# Patient Record
Sex: Male | Born: 1955
Health system: Southern US, Community
[De-identification: ages and names within clinical notes are randomized; demographics above are authoritative.]

## PROBLEM LIST (undated history)

## (undated) DIAGNOSIS — G4733 Obstructive sleep apnea (adult) (pediatric): Secondary | ICD-10-CM

## (undated) DIAGNOSIS — I10 Essential (primary) hypertension: Secondary | ICD-10-CM

## (undated) DIAGNOSIS — M1990 Unspecified osteoarthritis, unspecified site: Secondary | ICD-10-CM

## (undated) DIAGNOSIS — N433 Hydrocele, unspecified: Secondary | ICD-10-CM

## (undated) DIAGNOSIS — M545 Low back pain, unspecified: Secondary | ICD-10-CM

## (undated) DIAGNOSIS — E785 Hyperlipidemia, unspecified: Secondary | ICD-10-CM

## (undated) DIAGNOSIS — Z973 Presence of spectacles and contact lenses: Secondary | ICD-10-CM

## (undated) DIAGNOSIS — G8929 Other chronic pain: Secondary | ICD-10-CM

## (undated) DIAGNOSIS — Z9989 Dependence on other enabling machines and devices: Secondary | ICD-10-CM

## (undated) HISTORY — PX: COLONOSCOPY: SHX174

## (undated) HISTORY — PX: KNEE ARTHROSCOPY W/ SYNOVECTOMY: SHX1887

## (undated) HISTORY — PX: TONSILLECTOMY: SUR1361

## (undated) HISTORY — PX: TOTAL KNEE ARTHROPLASTY: SHX125

## (undated) HISTORY — PX: KNEE ARTHROSCOPY: SHX127

---

## 1993-04-17 HISTORY — PX: TOTAL HIP ARTHROPLASTY: SHX124

## 1997-04-17 HISTORY — PX: ANTERIOR CERVICAL DECOMP/DISCECTOMY FUSION: SHX1161

## 2001-01-23 ENCOUNTER — Encounter: Admission: RE | Admit: 2001-01-23 | Discharge: 2001-04-23 | Payer: Self-pay | Admitting: Family Medicine

## 2001-08-21 ENCOUNTER — Encounter: Admission: RE | Admit: 2001-08-21 | Discharge: 2001-11-19 | Payer: Self-pay | Admitting: Family Medicine

## 2003-06-04 ENCOUNTER — Encounter: Admission: RE | Admit: 2003-06-04 | Discharge: 2003-06-04 | Payer: Self-pay | Admitting: Family Medicine

## 2006-11-20 ENCOUNTER — Encounter: Payer: Self-pay | Admitting: Cardiology

## 2006-11-20 HISTORY — PX: TRANSTHORACIC ECHOCARDIOGRAM: SHX275

## 2007-01-29 ENCOUNTER — Inpatient Hospital Stay (HOSPITAL_COMMUNITY): Admission: RE | Admit: 2007-01-29 | Discharge: 2007-02-01 | Payer: Self-pay | Admitting: Orthopedic Surgery

## 2007-01-29 ENCOUNTER — Encounter: Payer: Self-pay | Admitting: Orthopedic Surgery

## 2007-01-30 ENCOUNTER — Ambulatory Visit: Payer: Self-pay | Admitting: Internal Medicine

## 2007-02-07 ENCOUNTER — Inpatient Hospital Stay (HOSPITAL_COMMUNITY): Admission: AD | Admit: 2007-02-07 | Discharge: 2007-02-09 | Payer: Self-pay | Admitting: Orthopedic Surgery

## 2007-02-16 ENCOUNTER — Encounter: Admission: RE | Admit: 2007-02-16 | Discharge: 2007-02-16 | Payer: Self-pay | Admitting: Orthopedic Surgery

## 2007-03-18 ENCOUNTER — Inpatient Hospital Stay (HOSPITAL_COMMUNITY): Admission: RE | Admit: 2007-03-18 | Discharge: 2007-03-21 | Payer: Self-pay | Admitting: Orthopedic Surgery

## 2007-05-04 ENCOUNTER — Encounter: Admission: RE | Admit: 2007-05-04 | Discharge: 2007-05-04 | Payer: Self-pay | Admitting: Orthopedic Surgery

## 2008-04-13 ENCOUNTER — Inpatient Hospital Stay (HOSPITAL_COMMUNITY): Admission: RE | Admit: 2008-04-13 | Discharge: 2008-04-16 | Payer: Self-pay | Admitting: Orthopedic Surgery

## 2010-06-08 ENCOUNTER — Ambulatory Visit (INDEPENDENT_AMBULATORY_CARE_PROVIDER_SITE_OTHER): Payer: BC Managed Care – PPO | Admitting: Cardiology

## 2010-06-08 DIAGNOSIS — R079 Chest pain, unspecified: Secondary | ICD-10-CM

## 2010-06-22 ENCOUNTER — Telehealth (INDEPENDENT_AMBULATORY_CARE_PROVIDER_SITE_OTHER): Payer: Self-pay | Admitting: Radiology

## 2010-06-23 ENCOUNTER — Ambulatory Visit (HOSPITAL_COMMUNITY): Payer: BC Managed Care – PPO | Attending: Cardiology

## 2010-06-23 ENCOUNTER — Encounter: Payer: Self-pay | Admitting: Internal Medicine

## 2010-06-23 DIAGNOSIS — R079 Chest pain, unspecified: Secondary | ICD-10-CM

## 2010-06-23 DIAGNOSIS — I4949 Other premature depolarization: Secondary | ICD-10-CM

## 2010-06-23 HISTORY — PX: CARDIOVASCULAR STRESS TEST: SHX262

## 2010-06-28 NOTE — Progress Notes (Signed)
Summary: Nuclear Pre-Procedure  Phone Note Outgoing Call Call back at [facility] Phone [phone]   Call placed by: Stanton Kidney, EMT-P,  June 22, 2010 11:05 AM Call placed to: Patient Action Taken: Phone Call Completed Reason for Call: Confirm/change Appt Summary of Call: Left message with information on [employer] Information Sheet (see scanned document for details). Stanton Kidney, EMT-P  June 22, 2010 11:05 AM      Nuclear Med Background Indications for Stress Test: Evaluation for Ischemia     Symptoms: Chest Pain  Symptoms Comments: Atypical-CP   Nuclear Pre-Procedure Cardiac Risk Factors: Family History - CAD, Hypertension, Lipids, Obesity Tech Comments: morbid obesity; premature fam. hx

## 2010-06-30 ENCOUNTER — Ambulatory Visit (HOSPITAL_COMMUNITY): Payer: BC Managed Care – PPO | Attending: Cardiology

## 2010-06-30 ENCOUNTER — Encounter: Payer: Self-pay | Admitting: Cardiology

## 2010-06-30 DIAGNOSIS — R079 Chest pain, unspecified: Secondary | ICD-10-CM

## 2010-07-05 NOTE — Assessment & Plan Note (Signed)
Summary: Cardiology Nuclear Testing  Nuclear Med Background Indications for Stress Test: Evaluation for Ischemia   History: Myocardial Perfusion Study  History Comments: '08 [facility] NL  Symptoms: Chest Pain, Palpitations  Symptoms Comments: Atypical-CP   Nuclear Pre-Procedure Cardiac Risk Factors: Family History - CAD, Hypertension, Lipids, Obesity Caffeine/Decaff Intake: none NPO After: 9:00 PM Lungs: clear IV 0.9% NS with Angio Cath: 20g     IV Site: R Hand IV Started by: Milana Na, EMT-P Chest Size (in) 58     Height (in): 72" Weight (lb): 350  Nuclear Med Study 1 or 2 day study:  2 day     Stress Test Type:  Eugenie Birks Reading MD:  Dietrich Pates, MD     Referring MD:  S.Tennant Resting Radionuclide:  Technetium 61m Tetrofosmin     Resting Radionuclide Dose:  33 mCi  Stress Radionuclide:  Technetium 29m Tetrofosmin     Stress Radionuclide Dose:  33 mCi   Stress Protocol  Max Systolic BP: 108 mm Hg Lexiscan: 0.4 mg   Stress Test Technologist:  Milana Na, EMT-P     Nuclear Technologist:  Doyne Keel, CNMT  Rest Procedure  Myocardial perfusion imaging was performed at rest 45 minutes following the intravenous administration of Technetium 65m Tetrofosmin.  Stress Procedure  The patient received IV Lexiscan 0.4 mg over 15-seconds.  Technetium 12m Tetrofosmin injected at 30-seconds.  There were no significant changes and occ pvcs with infusion.  Quantitative spect images were obtained after a 45 minute delay.  QPS Raw Data Images:  Images were motion corrected.  Extensive soft tissue (diaphragm, subcutaneous fat) surrounds heart. Stress Images:  Normal homogeneous uptake in all areas of the myocardium. Rest Images:  Normal homogeneous uptake in all areas of the myocardium. Subtraction (SDS):  No evidence of ischemia. Transient Ischemic Dilatation:  .89  (Normal <1.22)  Lung/Heart Ratio:  .33  (Normal <0.45)  Quantitative Gated Spect Images QGS EDV:  140 ml QGS  ESV:  55 ml QGS EF:  60 %   Overall Impression  Exercise Capacity: Lexiscan with no exercise. BP Response: Normal blood pressure response. Clinical Symptoms: No chest pain ECG Impression: No significant ST segment change suggestive of ischemia. Overall Impression: Normal stress nuclear study.

## 2010-08-30 NOTE — Op Note (Signed)
NAMEGEFFREY, MICHAELSEN                ACCOUNT NO.:  [id number]   MEDICAL RECORD NO.:  [id number]          PATIENT TYPE:  INP   LOCATION:  5729                         FACILITY:  [facility]   PHYSICIAN:  Feliberto Gottron. Turner Daniels, M.D.   DATE OF BIRTH:  [birth year]   DATE OF PROCEDURE:  01/29/2007  DATE OF DISCHARGE:                               OPERATIVE REPORT   PREOPERATIVE DIAGNOSIS:  Septic left knee.   POSTOPERATIVE DIAGNOSIS:  Septic left knee.   PROCEDURE:  1. Left knee arthroscopic irrigation and debridement.  Preoperative      cultures of joint fluid were sent.  2. Complete synovectomy   SURGEON:  Feliberto Gottron.  Turner Daniels, MD.   FIRST ASSISTANT:  None.   ANESTHETIC:  General endotracheal.   ESTIMATED BLOOD LOSS:  Minimal.   FLUID REPLACEMENT:  100 mL of crystalloid.   DRAINS PLACED:  Two large bore Hemovacs.   INDICATIONS FOR PROCEDURE:  A 55 year old man who had a right total hip  by me about 12 years ago, and I have known him for the last 12 years,  and he has done well.  He has a significant arthritis of both knees,  bone-on-bone arthritic changes.  He was coming up on a left total knee,  scheduled for next week, had had a cortisone injection about 3 or 4  weeks ago.  He did well until about 3 or 4 days ago when he started  having increased pain in his left knee.  The night before last and last  night he had some chills.  He came into the office, today, with  increased swelling and pain in his left knee.   This was preoperative H&P his temperature was 101.8.  We aspirated his  knee and got out about 30 mL of what to me looked like pus that was  yellow.  This was sent for a cell count, Gram stain, and culture.  The  cell count was 35,000 the, Gram stain was 98% polys too numerous to  count, they saw calcium pyrophosphate crystals.  They did not see any  organisms.  They did not see any calcium urate crystals.  He was taken  for urgent irrigation and debridement and thorough washout of  his left  knee.  The presumed diagnosis is going to be probably Staphylococcus  aureus;  and, in addition, we are concerned about MRSA although he has  never had that before, and his total hip was done well for 12 years.  The risks and benefits of surgery were discussed, questions answered.   DESCRIPTION OF PROCEDURE:  The patient identified by armband and  preoperatively received a gram of Ancef.  He was then taken to the  operating room at [facility].  Appropriate site monitors were  attached, and general endotracheal anesthesia was induced.  Tourniquet  applied high to the left thigh but never used.  Lateral post applied to  the table and left lower extremity prepped and draped in a sterile  fashion from the ankle to the mid thigh.  Using a #11 blade standard  inferomedial  and then lateral parapatellar portals were then made  allowing introduction of the arthroscope through the inferolateral  portal, and the outflow through the inferomedial portal.   We immediately encountered purulent material, and then thoroughly  irrigated out the knee with about 6 liters of normal saline with  epinephrine solution.  A complete synovectomy was performed.  We also  removed some denuded cartilage.  His medial compartment was down to bare  bone on the femoral and tibial side; and there was some inflamed  synovium, although not as much as we normally would expect with  pyarthrosis, especially one that may have smoldered for while, although  he was only symptomatic for about 3 days.   In any event, after thorough irrigation and debridement and synovectomy,  the arthroscopic instruments were then used to place two large bore  Hemovac drain using superomedial and superolateral portals.  These were  placed to suction and sewn in.  Arthroscopic instruments removed,  dressing of Xeroform, 4x4 dressing, sponges, ABD, Webril, and Ace wrap  was then applied.  The patient was then awakened and taken to  the  recovery room without difficulty.      Feliberto Gottron. Turner Daniels, M.D.  Electronically Signed     FJR/MEDQ  D:  01/29/2007  T:  01/30/2007  Job:  [id number]

## 2010-08-30 NOTE — Op Note (Signed)
[name], [name] NO.:  [id number]   MEDICAL RECORD NO.:  [id number]          PATIENT TYPE:  INP   LOCATION:  5033                         FACILITY:  [facility]   PHYSICIAN:  Feliberto Gottron. Turner Daniels, M.D.   DATE OF BIRTH:  06/03/1955   DATE OF PROCEDURE:  04/13/2008  DATE OF DISCHARGE:                               OPERATIVE REPORT   PREOPERATIVE DIAGNOSIS:  End-stage arthritis of the right knee with far  valgus deformity of about 10-15 degrees, 10-degree flexion contracture  and bone-on-bone arthritic changes.   POSTOPERATIVE DIAGNOSIS:  End-stage arthritis of the right knee with far  valgus deformity of about 10-15 degrees, 10-degree flexion contracture  and bone-on-bone arthritic changes.   PROCEDURE:  Right total knee arthroplasty using DePuy Sigma RP  components, a 5 tibial baseplate for femoral component, right 41-mm  patellar button and 10-mm Sigma RP bearing.   SURGEON:  Feliberto Gottron. Turner Daniels, MD   FIRST ASSISTANT:  Shirl Harris, PA-C   ANESTHETIC:  General endotracheal.   ESTIMATED BLOOD LOSS:  Minimal.   FLUID REPLACEMENT:  1800 mL of crystalloid.   DRAINS PLACED:  Foley catheter.   URINE OUTPUT:  300 mL, two medium Hemovacs.   TOURNIQUET TIME:  1 hour and 55 minutes.   INDICATIONS FOR PROCEDURE:  A 55 year old gentleman who has had previous  hip replacement surgery and left knee replacement surgery by me most  recently a couple of years ago using DePuy Sigma RP components.  He had  a varus knee on the left.  He has a valgus knee on the right bone-on-  bone with a 15-degree valgus deformity, 10-degree flexion contracture  and basically this is a far valgus arthritic knee.  He has severe  unremitting pain, has failed conservative treatment and desires elective  total knee arthroplasty and risks and benefits of surgery are well known  to the patient.   DESCRIPTION OF PROCEDURE:  The patient was identified by armband and  underwent right femoral nerve  block in the block area at [facility], received 2 g of Ancef IV preoperatively and was taken to  operating room 10 where the appropriate anesthetic monitors were  attached and general endotracheal anesthesia was induced with the  patient in supine position.  A Foley catheter was inserted.  Tourniquet  was applied high to the right thigh and the right lower extremities were  then prepped and draped in the usual sterile fashion from the ankle to  the tourniquet.  Lime wrapped with an Esmarch bandage, tourniquet was  inflated to 350 mmHg.  Standard time-out procedure was performed.  Anterior midline incision, about 22 cm in length was made from a  handbreadth above the patella over the patella 1 cm medial to and 4 cm  distal to the tibial tubercle.  Small bleeders in the skin and  subcutaneous were identified and cauterized.  Transverse retinaculum was  incised and reflected medially allowing medial parapatellar arthrotomy.  The patella was everted.  Prepatellar fat pad was then resected as where  the anterior one half of the menisci with  the knee flexed.  We then used  a 1/2-inch osteotome where to remove peripheral osteophytes from the  femur as well as notch osteophytes and also excised the ACL and PCL  ligaments.  The superficial medial collateral ligament was elevated from  the anterior to posterior off the proximal tibia leaving intact distally  allowing placement a posteromedial Z retractor and Michaela retractor  through the notch and lateral Homan retractor with the knee flexed about  150 degrees.  It was translated to the proximal tibia anteriorly, then  entered with the DePuy step drill followed by an IM rod and a 2-degree  posterior-slope cutting guide.  This was pinned into place allowing  removal about 6-7 mm of bone laterally, 8-9 mm of bone medially from the  proximal tibia and with a guide pinned in place proximal tibial cut  accomplished.  We then entered the distal  femur 2 mm of the anterior of  the PCL origin with a step drill followed by an IM rod and 7-degree  right distal femoral cutting guide set at 12 mm, pinned along the  epicondylar axis and the distal femoral cut accomplished removing a  couple of millimeters of bone from the lateral femoral condyle on the  full 12 mm on the medial femoral condyle.  We then sized for #4 femoral  component use of posterior referencing sizing guide and pinned in place  an 0 degrees of external rotation and then placed the chamfer cutting  guide over the pins and screw the guide into place.  The anterior-  posterior and chamfer cuts were then accomplished without difficulty  followed by the Sigma RP box cut.  The patella was then measured at 27  mm.  He used 41-button on the other side.  We set the cutting guide at  16 and removed the posterior 11 mm of the patella sized for 41 button  and drilled.  The knee was then hyperflexed exposing the proximal tibia  which did in fact sized to a #5 proximal tibial baseplate.  This was  pinned into place followed by the reamer guide followed by conical  reamer and then the Delta fin keel punch.  A #4 right femoral trial was  then hammered into place and drilled with the lug drill.  A 10-mm Sigma  RP trial bearing was placed on the tibia.  The knee was then reduced  brought up to full extension and came to full extension with a 7-degree  alignment, which was felt to be excellent and the ligaments were stable.  Interestingly because of fairly generous patellar resection, the patella  tracked without any lateral release and no thumb pressure.  At this  point, the trial components were removed.  We removed the posterior  horns of the medial and lateral menisci at this point with the knee in  extension.  The knee was then flexed and all bony surfaces were water-  picked, clean, and dry with suction and sponges.  We also checked for  any posterior osteophytes superior to the  femur.  At the back table, a  double batch of DePuy HV cement with 1500 mg of Zinacef was mixed and  applied all bony metallic mating surfaces except for the posterior  condyles of the femur itself.  In order, we hammered into place, a #5  tibial baseplate, and removed the excess cement.  A 4 right femoral  component removed the excess cement and then clamped the 41-mm patellar  button  in place and removed the excess cement.  The 10-mm Sigma RP  bearing was then snapped into place and the knee was reduced and held in  extension with compression as the cement hardened.  Drains were placed  from an anterolateral approach and the wound was pulse lavaged at one  more time with normal saline solution.  At this point, we checked our  patellar tracking one more time and the ligaments one more time.  Stability was excellent.  The parapatellar arthrotomy was then closed  with running #1 Vicryl suture.  The subcutaneous tissue with 0 and 2-0  undyed Vicryl suture and the skin with skin staples.  A dressing of  Xeroform, 4 x 4 dressing, sponges, Webril, and an Ace wrap applied.  The  tourniquet was let down.  The patient awakened and taken to the recovery  room without difficulty.      Feliberto Gottron. Turner Daniels, M.D.  Electronically Signed     FJR/MEDQ  D:  04/13/2008  T:  04/13/2008  Job:  [id number]

## 2010-08-30 NOTE — Op Note (Signed)
[name], [name]                ACCOUNT NO.:  [id number]   MEDICAL RECORD NO.:  [id number]          PATIENT TYPE:  OIB   LOCATION:  3019                         FACILITY:  [facility]   PHYSICIAN:  Feliberto Gottron. Turner Daniels, M.D.   DATE OF BIRTH:  Jan 22, 1956   DATE OF PROCEDURE:  02/05/2007  DATE OF DISCHARGE:                               OPERATIVE REPORT   PREOPERATIVE DIAGNOSIS:  Presumed septic arthritis left knee.   POSTOPERATIVE DIAGNOSIS:  Presumed septic arthritis left knee.   PROCEDURE:  Redo arthroscopic washout and synovectomy of the left knee.   SURGEON:  Feliberto Gottron.  Turner Daniels, MD.   FIRST ASSISTANT:  None.   ANESTHETIC:  General endotracheal.   ESTIMATED BLOOD LOSS:  Minimal.   FLUID REPLACEMENT:  800 mL of crystalloid.   SPECIMENS:  To lab, second set of fluid gram-stain cultures; including  AFB and fungi.   INDICATIONS FOR PROCEDURE:  A 55 year old man who was getting set up for  a total knee arthroplasty and came in for his preoperative history and  physical a week ago.  He was found to have a red-hot swollen knee and  felt systemically ill.  We went ahead and tapped his knee; and got out  30 mL of fluid with a cell count of 35,000, 98% polys, no organisms, and  intracellular calcium pyrophosphate crystals.  Because of his overall  condition and the fact he had a fever of 102, we went ahead and admitted  him.  We arthroscopically washed out his knee.  The cultures came back  negative at 3 and 5 days.  He was kept on IV vancomycin until Thursday  evening.  When those cultures came back negative and in consultation  with infectious diseases, antibiotics were stopped and drains removed  Friday morning.  ID felt that he did not  actually have an infection and  felt his condition was secondary to CPPD, despite being on anti-  inflammatory medicines; and he was sent home.   He called back last evening saying he was feeling sick again with some  swelling in his knee.  We had him  come in this morning at 8:30.  Once  again, 3+ effusion; this time the fluid was quite turbid.  The cell  count had gone up to 69,000, 98%  polys; and again, intracellular CPPD  crystals and no organisms.  Because he felt ill and again had a fever up  to 101.7, we went ahead and brought him into the hospital and gave him a  dose of IV vancomycin and prepared him for arthroscopic washout of his  left knee again.  The risks and benefits of surgery were well-known to  the patient.  We will get him set up for his surgical intervention.   DESCRIPTION OF PROCEDURE:  The patient was identified by armband and  taken to the operating room at [facility].  There the  appropriate anesthetic monitors were attached and general endotracheal  anesthesia induced with the patient in supine position.  Lateral posts  were applied to the table.  The left  lower extremity prepped and draped  in the usual sterile fashion from the ankle to the mid thigh.  Using a  #11 blade, standard inferomedial and inferolateral peripatellar portals  were then made; after introduction of the arthroscope through the  inferolateral portal and the outflow through the inferomedial portal.  Purulent material was obtained and sent off for Gram-stain culture cell  count -- including aerobic, anaerobic, AFB and fungi.  At this point, we  set about performing yet another synovectomy. Once again, the synovium  was moderately inflamed but not severely inflamed, and it was easy to  scope the knee.  We scoped out the medial and lateral compartments;  found again some shredding of the cartilage along the meniscus.  But,  again, he is down to bare bone medially anyway.  Therefore, there was no  articular cartilage to debride on the medial side.  The lateral  articular cartilage appeared to be unchanged from a week ago.  The  gutters were cleared medially and laterally.  Some inflammatory material  was removed from the notch with  a 4.2 [employer] shaver, and  inflamed synovium from the suprapatellar pouch.  Overall we irrigated  out with almost 6 liters of normal saline solution, and placed in large-  bore Hemovac drains from a superomedial and superolateral parapatellar  portals.  These drains were sewn in.  A dressing of Xerofoam 4x4  dressing sponges, Webril and an Ace wrap was then applied.  The drains  were placed to suction.  The patient was then awakened and taken to the  recovery room without difficulty.      Feliberto Gottron. Turner Daniels, M.D.  Electronically Signed     FJR/MEDQ  D:  02/05/2007  T:  02/06/2007  Job:  [id number]

## 2010-08-30 NOTE — Op Note (Signed)
[name], STENE NO.:  [id number]   MEDICAL RECORD NO.:  [id number]          PATIENT TYPE:  INP   LOCATION:  2899                         FACILITY:  [facility]   PHYSICIAN:  Feliberto Gottron. Turner Daniels, M.D.   DATE OF BIRTH:  1955-10-07   DATE OF PROCEDURE:  03/18/2007  DATE OF DISCHARGE:                               OPERATIVE REPORT   PREOPERATIVE DIAGNOSIS:  End-stage arthritis of left knee.   POSTOPERATIVE DIAGNOSIS:  End-stage arthritis of left knee.   PROCEDURE:  Left total knee arthroplasty using DePuy Sigma RP  components, 4 femur left, 5 tibia, 10-mm Sigma RP spacer, 41-mm patellar  bearing, double batch of DePuy I cement with the 1200 mg of tobramycin.   FIRST ASSISTANT:  Skip Mayer PA-C.   ANESTHETIC:  General endotracheal.   ESTIMATED BLOOD LOSS:  Minimal.   FLUID REPLACEMENT:  Approximately 1500 mL crystalloid.   DRAINS PLACED:  Foley catheter and two medium hemostats.   URINE OUTPUT:  Approximately 300 mL.   TOURNIQUET TIME:  Approximately 1 hour 25 minutes.   INDICATIONS FOR PROCEDURE:  A 55 year old male who has already had a  total hip arthroplasty and has end-stage arthritis of left and right  knee.  The left knee is in varus, the right knee is in valgus.  He has  wind-swept deformity.  He was supposed to have this knee replacement  done about four months ago but preoperatively had swelling and erythema  around the knee.  When we tapped it, it looked like pus and came back  with about 15,000 to 20,000 white cells but grew out nothing.  He  underwent an arthroscopic debridement and grew out nothing at those  cultures as well.  We he came back a week later, he had reaccumulated.  We tapped it again and came back with 40,000 cells, and again those  cultures were negative as were a second set of cultures from yet another  arthroscopic debridement.  He did have pseudogout crystals on all four  specimens.  He was seen by rheumatology and it was felt  this was a  pseudogout attack which would be very unusual, but that is what it was.  He was treated with colchicine but also had vancomycin when he was in  the hospital, and because of concern about MRSA, although again nothing  ever grew out and I never saw any organisms on any of the four Gram  stains, he also received a four-week course of Avelox.  He has been off  of those medications now for over two months.  He has not reaccumulated.  A preoperative specimen of fluid looked like normal joint fluid last  week and came back essentially with no growth, no organisms and only a  few white cells.  It was felt that his difficulties in his left knee  that were recently exacerbated were from pseudogout which again would be  very unusual.   In any event, he is ready now for total knee arthroplasty.  Risks and  benefits of surgery were discussed.  He is going to receive  some  tobramycin mixed into the cement which would be of course a good drug  for MRSA if he, in fact, has MRSA.  Risks and benefits of surgery were  discussed and questions answered.   DESCRIPTION OF PROCEDURE:  The patient was identified by armband and  underwent left femoral nerve block in the block area.  He was then taken  to OR 1 at [facility].  Appropriate anesthetic monitors were  attached.  General endotracheal anesthesia was induced.  He received  preoperative antibiotics IV.  A Foley catheter was inserted.  A  tourniquet was applied high to the left thigh and the left lower  extremity prepped and draped in the usual sterile fashion from the ankle  to the tourniquet.  We also placed a foot positioner on the lateral post  on the table.   After the prep and drape, the limb was wrapped with an Esmarch bandage  and the tourniquet inflated to 350 mmHg.  We began the procedure by  making an anterior midline incision starting a handbreadth above the  patella, going over the patella 1 cm medial to and 3 cm distal  to the  tibial tubercle.  Small bleeders in the skin and subcutaneous tissue  were identified and cauterized.  Transverse retinaculum was incised in  line with the skin incision, reflected medially allowing a medial  parapatellar arthrotomy.  We resected prepatellar fat pad and elevated  the superficial medial collateral ligament from anterior to posterior  off the proximal tibia, leaving it intact distally.  A specimen of  normal-appearing joint fluid was sent to the lab and came back later  with no organisms and no white cells.  After elevating the superficial  medial collateral ligament all the way around to the posteromedial  corner, the knee was then hyperflexed with the patella everted.  Osteotomes were used to removed peripheral osteophytes which were quite  exuberant from the medial femur, tibia and around the patella as well as  notch osteophytes.  The ACL and the PCL were resected at this point with  electrocautery.  The knee was externally rotated and the tibia brought  forward using a Michaela retractor through the notch and a lateral Homan  retractor.  We then entered the proximal tibia with the DePuy step drill  followed by the IM rod and a 0-degree posterior slope cutting guide.   Using the cutting guide, we removed only a couple millimeters of bone  medially and 8 to 9 mm of bone laterally.  Satisfied with a proximal  tibia resection, we then entered the femur 2 mm anterior the PCL origin  followed by the IM rod set for a 5-degree left distal femoral cut.  The  cutting guide was set at 12 mm because the patient's rather large body  habitus and the distal left femoral cut accomplished.  We then sized for  a 4 femoral component, pinned the cutting guide in 3 degrees of external  rotation and performed our anterior-posterior chamfer cuts followed by  the box cut with the box cutting guide.  The patella itself was measured  at 24 mm.  We set the cutting guide at 14 and removed  the posterior 10  mm of the patella and sized for 41 button and drilled the patella.  The  patella also had some very exuberant osteophytes that required removal  as well.  We then hyperflexed the knee one more time and sized for a 5  tibial  baseplate which was pinned into place followed by the smokestack  and the conical reamer followed by the bullet and the Delta 5th keel  punch.  We then hammered into place a 5 left trial femoral component,  drilled the lugs and a 10-mm bearing was placed on top of the tibial  trial and the knee reduced.  The patient came to full extension, flexed  to 130 degrees.  There was no restriction of motion and the 41-mm  patellar trial tracked normally.  At this point, all trial components  were removed.  Bony surfaces were Water-Pik'd clean, dried with suction  and sponges and a double batch of DePuy 1 cement was then mixed and  applied to all bony and metallic mating surfaces except for the  posterior condyles of the femur itself.   In order, we hammered into place a #5 tibial baseplate and removed  excess cement, a 4 left femoral component and removed excess cement, a  41-mm patellar button was then squeezed into place and excess cement  removed.  As the cement hardened, we placed a lateral medium Hemovac  drain, Water-Pik'd the wound clean one more time, removed the clamp on  the patella, checked our tracking one more time, made sure there were no  bits of cement in the wound and then closed the parapatellar arthrotomy  with running #1 Vicryl suture, the subcutaneous tissue with 0 and 2-0  undyed Vicryl suture and the skin with skin staples.  A dressing of  Xeroform, 4 x 4 dressing sponges, Webril and an Ace wrap were applied.  The tourniquet was let down.  The patient was awakened and taken to the  recovery room without difficulty.      Feliberto Gottron. Turner Daniels, M.D.  Electronically Signed     FJR/MEDQ  D:  03/18/2007  T:  03/18/2007  Job:  [id number]

## 2010-09-02 NOTE — Discharge Summary (Signed)
NAMEARVEL, OQUINN                ACCOUNT NO.:  [id number]   MEDICAL RECORD NO.:  [id number]          PATIENT TYPE:  INP   LOCATION:  5020                         FACILITY:  [facility]   PHYSICIAN:  Feliberto Gottron. Turner Daniels, M.D.   DATE OF BIRTH:  [birth year]   DATE OF ADMISSION:  03/18/2007  DATE OF DISCHARGE:  03/21/2007                               DISCHARGE SUMMARY   ADDENDUM:  This is a continuation of dictation number [id number].   DIAGNOSES:  Infection of left knee with underlying degenerative joint  disease.   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      Laural Benes. Jannet Mantis.      Feliberto Gottron. Turner Daniels, M.D.  Electronically Signed    JBR/MEDQ  D:  05/10/2007  T:  05/10/2007  Job:  [id number]

## 2010-09-02 NOTE — Discharge Summary (Signed)
[name], [name] NO.:  [id number]   MEDICAL RECORD NO.:  [id number]          PATIENT TYPE:  INP   LOCATION:  5033                         FACILITY:  [facility]   PHYSICIAN:  Feliberto Gottron. Turner Daniels, M.D.   DATE OF BIRTH:  April 30, 1955   DATE OF ADMISSION:  04/13/2008  DATE OF DISCHARGE:  04/16/2008                               DISCHARGE SUMMARY   CHIEF COMPLAINT:  Knee pain.   HISTORY OF PRESENT ILLNESS:  This is a 55 year old who complains of  unremitting pain in the right knee despite conservative treatment with  steroid injections, viscosupplementation and pain meds, he desires  surgical intervention after the risks and benefits of surgery were  discussed with the patient.   PAST MEDICAL HISTORY:  Significant for hypertension and osteoarthritis.   PAST SURGICAL HISTORY:  1. Right total hip arthroplasty.  2. Left total knee arthroplasty.  3. Neck surgery.  4. Lumbar spine surgery.   SOCIAL HISTORY:  He is a nonsmoker and denies any alcohol use.   FAMILY HISTORY:  Noncontributory.   ALLERGIES:  He has no known drug allergies.   CURRENT MEDICATIONS:  1. Lisinopril/hydrochlorothiazide 12.5/25 mg 2 tablets p.o. daily.  2. Zocor 80 mg 1 p.o. daily.  3. Celebrex 200 mg 1 p.o. b.i.d.  4. Levitra 10 mg p.r.n.  5. Aspirin 325 mg 1 p.o. daily.  6. Vicodin 5/500 mg 1 p.o. q.4-6 h. p.r.n. pain.   PHYSICAL EXAMINATION:  GENERAL:  Gross examination of the right knee  demonstrates the patient to have a valgus deformity.  Range of motion is  estimated to be 5-100 degrees.  GAIT:  He walks with an antalgic gait.  NEUROVASCULAR:  Intact.   X-rays demonstrate bone-on-bone degenerative joint disease of the right  knee.   PREOPERATIVE LABORATORIES:  White blood cells 11.6, red blood cells  5.22, hemoglobin 14.8, hematocrit 44.5, and platelets 253.  PT 12.2, INR  0.9, and PTT 36.  Sodium 138, potassium 4.2, glucose 142, BUN 17, and  creatinine 1.21.  Urinalysis was within  normal limits.   HOSPITAL COURSE:  [name] was admitted to [facility] on April 13, 2008, when he underwent right total knee arthroplasty.  Perioperative Foley catheter was placed and 2 Hemovac drains were placed  into the right knee.  The patient tolerated the procedure well and was  transferred to the orthopedic floor.  He was placed on Lovenox and  Coumadin for DVT prophylaxis.  On the first postoperative day, the  patient was awake and alert, and tolerating p.o. intake well.   Hemoglobin was 11.6.  Surgical drains were removed and the Foley  catheter was discontinued.  He was evaluated by physical therapy and  ambulated 128 feet.   On the second postoperative day, the patient was awake and alert passing  flatus and tolerating p.o. intake well.  His knee pain was well  controlled with oral medications, so his PCA was discontinued.  Surgical  dressing was changed.  Incision was benign.  Hemoglobin was 10.2.   On the third postoperative day, the patient was awake and  alert, and  taking p.o. intake well.  He was ambulating independently and was  discharged to his home.   DISPOSITION:  The patient was discharged home on April 16, 2008, he  was weightbearing as tolerated.  [facility] would manage his  wound, Coumadin, and physical therapy.  He would return to his clinic in  1 week to see Dr. Turner Daniels.   DISCHARGE MEDICINES:  As per the HMR with the additions of Coumadin to  take as directed with a target INR of 1.5 to 2 and Percocet 5 mg tablets  1-2 tablets p.o. q.4 h. p.r.n. pain., and Robaxin 500 mg 1 p.o. b.i.d.  p.r.n. spasm.   FINAL DIAGNOSIS:  End-stage degenerative joint disease of the right  knee.      Shirl Harris, PA      Feliberto Gottron. Turner Daniels, M.D.  Electronically Signed    JW/MEDQ  D:  05/25/2008  T:  05/25/2008  Job:  [id number]

## 2010-09-02 NOTE — Discharge Summary (Signed)
[name], [name] NO.:  [id number]   MEDICAL RECORD NO.:  [id number]          PATIENT TYPE:  INP   LOCATION:  5028                         FACILITY:  [facility]   PHYSICIAN:  Feliberto Gottron. Turner Daniels, M.D.   DATE OF BIRTH:  1956/04/07   DATE OF ADMISSION:  02/05/2007  DATE OF DISCHARGE:  02/09/2007                               DISCHARGE SUMMARY   FINAL DIAGNOSIS FOR THIS ADMISSION:  Septic left knee.   PROCEDURE WHILE IN HOSPITAL:  Irrigation & debridement left knee.   DISCHARGE SUMMARY:  Patient is a 55 year old man who was set up for a  total knee arthroplasty and during his preoperative History & Physical  was found to have a red, hot, swollen knee and systemically ill.  He was  aspirated with positive aspirate and was irrigated and debrided and  admitted.  Cultures remained negative at 3 and 5 days.  He was kept on  IV vancomycin until discharge.  Antibiotics were stopped after a  consultation with Infectious Disease and drains were removed and he was  discharged home.  It was felt that this effusion was secondary to  pseudogout rather than infection and he was sent home.  For this  admission, he began feeling systemically ill the night before with  swelling in the knee.  His effusion was 3+, the fluid was quite turbid  on aspiration, cell count had gone up to 69,000 with intracellular CPPD  crystals, no organisms, fever was up to 101.7 and rapid heart rate and  because of this he was brought in to the hospital, given aggressive IV  vancomycin and prepared for arthroscopic washout of left knee once  again.  Surgical risks were discussed and well known to the patient.   PAST MEDICAL HISTORY:  Unchanged from previous H&P.   VITAL SIGNS AT TIME OF ADMISSION:  Temperature 97.8, pulse decreased to  95, blood pressure 128/78.   PHYSICAL EXAM:  Basically unchanged from previous admission.   Cultures were sent.  Preoperative labs including CBC, CMET, chest x-ray,  EKG,  PT and PTT were obtained.  Significant for a WBC of 13.1,  hematocrit of 12, potassium of 128, glucose of 115.   HOSPITAL COURSE:  On the day of admission, the patient was taken to the  operating room for a re-do arthroscopic wash-out and synovectomy of the  left knee.  Once again cultures were sent and he was placed on  postoperative antibiotics as well as PCA for pain control.  Infectious  Disease was consulted.  They agreed to continue the vancomycin until  cultures were complete.  Postoperative day 1 the patient was complaining  of minimal pain, no nausea or vomiting, felt systemically much better.  Hemovac output 50 mL.  He was afebrile, vital signs were stable, calf  was nontender and he was otherwise stable.  The thought was still that  he was probably suffering from acute pseudogout and colchicine was begun  after a consultation with Rheumatology.  On postoperative day 2, the  patient was awake and alert with decreased pain, drains continued  output.  A CRP was obtained __________ positive __________ 25.6.  Postoperative day 3, the patient was complaining of moderate pain, T-max  100, drain output 60 mL, trace effusion, drains were continued as were  antibiotics but doxycycline was changed to Avelox.  Postoperative day 4,  the patient was discharged home to the care of his family on p.o.  Avelox, drains had been discontinued, of course.  He will return to see  Dr. Turner Daniels in 3 days' time.   MEDICATIONS:  He will resume his home meds as per his home med rec sheet  as well as continue pain meds and Avelox for 3 weeks' time.   If worse, return sooner should his symptoms return.   FINAL DIAGNOSIS FOR THIS ADMISSION:  Pseudogout of the left knee.   PROCEDURE WHILE IN HOSPITAL:  Irrigation & debridement of the left knee.      [name]. [name].      Feliberto Gottron. Turner Daniels, M.D.  Electronically Signed    JBR/MEDQ  D:  04/13/2007  T:  04/14/2007  Job:  [id number]

## 2010-09-02 NOTE — Discharge Summary (Signed)
NAMESAVAN, Steven                ACCOUNT NO.:  [id number]   MEDICAL RECORD NO.:  [id number]          PATIENT TYPE:  INP   LOCATION:  5732                         FACILITY:  [facility]   PHYSICIAN:  Feliberto Gottron. Turner Daniels, M.D.   DATE OF BIRTH:  [birth year]   DATE OF ADMISSION:  01/29/2007  DATE OF DISCHARGE:  02/01/2007                               DISCHARGE SUMMARY   ADDENDUM:  I am continuing on the Discharge Summary Job [phone].  After  Discharge Diagnosis being infection.   DISCHARGE SUMMARY:  The patient is a 55 year old male with a many year  history of pain in his left knee.  He has failed treatment with steroid  injections, visco supplementation, pain meds, knee scope.  He wished to  proceed with a total knee arthroplasty, but at the time of his H&P was  found to have significant swelling in the knee.  He was taken to the  operating room on the date of that H&P, which was January 29, 2007,  underwent I&D of the knee x2.  He has now had successful IV antibiotics  as well as negative aspirate.  He wishes to proceed on with the planned  total knee arthroplasty after discussing risks vs. benefits.  He has no  known drug allergies.   MEDICATIONS AT TIME OF THIS ADMISSION:  Lisinopril, Celebrex, aspirin,  Darvocet, Vicodin, Cialis, colchicine, Tylenol and so forth.   Usual childhood diseases.  Adult history positive for hypertension, DJD.   SURGICAL HISTORY:  1. Right total hip arthroplasty, 1995.  2. Lumbar, 1998.  3. Cervical, 1999.  4. Left knee scoped 2004, no difficulties with GET.   SOCIAL HISTORY:  1. Negative tobacco.  2. Negative ethanol.  3. Negative IV drug abuse.  4. He is married.  5. He is a Production designer, theatre/television/film for [employer].   FAMILY HISTORY:  1. Mother died of heart failure.  2. Father died of MI.   REVIEW OF SYSTEMS:  Positive for glasses.  The patient has had positive  fever, flushing and swelling but none recently.  No recent chest pain or  shortness of breath.   EXAM:  Patient's exam preoperative was within normal limits on all  vitals.  He is a 6 feet, 370 pound male.  HEAD:  Normocephalic, atraumatic.  EARS:  TMs clear.  NECK:  Supple.  Full range of motion.  CHEST:  Clear to auscultation and percussion.  HEART:  Has a regular rate and rhythm.  LEFT KNEE:  Has positive pain to all ranges of motion with a flexion  contracture.  He has endstage arthritis of the left knee.   PREOPERATIVE LABS:  CBC, CMET, chest x-ray, electrocardiogram, PT  within normal limits, are not critical to continuing with the patient's  surgery.   So, at the time of his admission he was taken to the [facility] operating room  where he underwent a left total knee arthroplasty using DePuy Sigma #4  femur, #5 tibia with a 10 mm size 4 insert and a 41 mm patella.  All  components were submitted.  A medium Hemovac was  placed into the wound.  The patient was placed on perioperative antibiotics, placed on  postoperative Coumadin prophylaxis in addition to Lovenox therapy.  __________ was begun postoperatively in the PACU.  The first  postoperative day the patient was awake and alert, no nausea or  vomiting, taking p.o.'s well, pain was minimal, vital signs were stable,  drain output less than 100, urine output greater than 600, PT 13.7.  He  is otherwise stable and will continue with physical therapy.  Postoperative day 2, the patient was awake and alert, no nausea or  vomiting, he had walked in the hallway and transferring independently,  wound was clean and dry, T-max 100, vital signs were otherwise stable,  wound was clean and dry, hemoglobin 10.4.  He continued with PT.  Postoperative day 3, the patient was without complaint, he was afebrile,  vital signs were stable, hemoglobin 9.1, WBC of 8.1, INR of 2.3,  dressing was dry, wound was benign, calf was soft, nontender, he was  medically stable and orthopedically improved and was discharged home to  the care of his family.    DISCHARGE INSTRUCTIONS:  1. Dressing changes daily.  2. CPM x5 hours daily, increasing by 10 degrees per day.  3. Home Health PT.  4. Home Health RN.  5. DMEs as per [facility].  6. Diet is regular.  7. Home Med Rec sheet is signed.  He will continue with all of his      home meds other than the aspirin.  8. He will be on Coumadin per [facility] for 2 weeks      postoperatively.  9. He is given prescriptions for Vicodin and Robaxin.  10.He will follow up with Dr. Turner Daniels in 1 week's time for followup      check, sooner should he have any increase in pain, increase in      drainage or temperature greater than 101.      Laural Benes. Jannet Mantis.      Feliberto Gottron. Turner Daniels, M.D.  Electronically Signed    JBR/MEDQ  D:  05/10/2007  T:  05/10/2007  Job:  [id number]

## 2010-09-02 NOTE — Discharge Summary (Signed)
[name], [name]                ACCOUNT NO.:  [id number]   MEDICAL RECORD NO.:  [id number]          PATIENT TYPE:  INP   LOCATION:  5020                         FACILITY:  [facility]   PHYSICIAN:  Feliberto Gottron. Turner Daniels, M.D.   DATE OF BIRTH:  1955/10/09   DATE OF ADMISSION:  03/18/2007  DATE OF DISCHARGE:  03/21/2007                               DISCHARGE SUMMARY   DISCHARGE DIAGNOSIS:  Infection of left knee with underlying  degenerative joint disease.   HOSPITAL COURSE:  The patient is a 55 year old male with a 1-year  history of pain in his left knee.  He has failed treatment and has  undergone   Dictation ended at this point.      Laural Benes. Jannet Mantis.      Feliberto Gottron. Turner Daniels, M.D.     [name]  D:  05/09/2007  T:  05/10/2007  Job:  [id number]

## 2010-09-02 NOTE — Discharge Summary (Signed)
[name], [name]                ACCOUNT NO.:  [id number]   MEDICAL RECORD NO.:  [id number]          PATIENT TYPE:  INP   LOCATION:  5732                         FACILITY:  [facility]   PHYSICIAN:  Feliberto Gottron. Turner Daniels, M.D.   DATE OF BIRTH:  1956/03/20   DATE OF ADMISSION:  01/29/2007  DATE OF DISCHARGE:  02/01/2007                               DISCHARGE SUMMARY   FINAL DIAGNOSIS:  Septic left knee.   PROCEDURES PERFORMED:  Irrigation and debridement of left knee.   DISCHARGE SUMMARY:  The patient is a 55 year old man, who had a right  total hip done by Dr. Turner Daniels some 12 years ago, and he has done well with  this.  He has had significant arthritis of both knees, bone on bone  arthritic changes.  He was in the office for a preoperative history and  physical for a left total knee.  Three to four days before this history  and physical was scheduled, he started having increased pain in the left  knee with some chills, had increased swelling and pain in the knee, and  at the preoperative H&P, his temperature was 101.8.  Aspiration of the  knee was positive for 30 ml of what looked like purulent material sent  for a cell count, gram stain, and culture.  Cell count was 35,000, gram  stain was 98% polys too numerous to count with calcium pyrophosphate  crystals, but no organisms.  He will be taken for urgent irrigation and  debridement through a washout of the knee with presumed diagnosis of  Staph aureus.  Risk and benefits of the surgery were discussed and all  questions were answered.   ALLERGIES:  No known drug allergies.   ADMISSION MEDICATIONS:  Celebrex, Zocor, Lisinopril, Darvocet, and  Vicodin.   PAST MEDICAL HISTORY:  1. Usual childhood diseases.  2. Adult history:      a.     Hypertension.      b.     DJD.   PAST SURGICAL HISTORY:  1. Right total hip arthroplasty in 1995.  2. Lumbar cervical fusions in 1998 and 1999.  3. Knee scoped in 2004.   SOCIAL HISTORY:  No tobacco, no  ethanol, and no IV drug abuse.  He is  married and works as a Production designer, theatre/television/film for [employer].   FAMILY HISTORY:  Mother died of heart failure; father died of an MI at  age unknown.   REVIEW OF SYSTEMS:  Positive for glasses.  Positive fever, swelling, and  flushing.  No chest pain or shortness of breath.   PHYSICAL EXAMINATION:  VITAL SIGNS:  Temperature 101.8, pulse 104,  respirations 18, and blood pressure 131/80.  The patient is a 6 foot,  370 pound male.  HEAD:  Normocephalic and atraumatic.  EARS:  TMs are clear.  EYES:  Pupils equal, round, and reactive to light and accommodation.  NOSE, MOUTH, THROAT:  Benign.  NECK:  Supple with full range of motion.  CHEST:  Clear to auscultation and percussion.  HEART:  Rapid rate and regular rhythm.  ABDOMEN:  Soft and nontender.  EXTREMITIES:  The left knee with positive effusion, erythema, and pain  in all ranges of motion, unable to bear weight without significant  difficulty.  SKIN:  Positive for erythema, no actual breakdown.   X-ray showed bone on bone changes of the left knee.   PREOPERATIVE LABORATORY DATA:  Including a CBC, CMET, chest x-ray, HG,  PT, and PTT were within normal limits with the exception of the gram  stain as mentioned in the history of present illness and a WBC of 14.4,  hemoglobin of 12.0, sodium of 131, glucose of 120, albumin of 2.8.   HOSPITAL COURSE:  On the day of admission, the patient was taken to the  operating room where he underwent a left knee arthroscopic irrigation  and debridement and complete synovectomy.  Large-bore Hemovacs were  placed.  The patient was placed on perioperative antibiotics, which  continued postoperatively, and he was placed on Vancomycin followed by  pharmacy protocol.  He was placed on a PCA pump for pain control.  On  postoperative day one, the patient was awake and alert with decreased  pain, vital signs were stable, he was afebrile, hemoglobin 12.0, WBC  14.4, cultures  were pending, Hemovac output 35 ml, continued with the  antibiotics.  Infectious Disease consult was obtained, and they felt  that he should continue with Vancomycin pending final cultures versus  pseudogout with the positive findings of calcium pyrophosphate crystals  in the fluid taken __________ .  On postoperative day two, the patient  was without complaints, he was afebrile, no output in the Hemovac,  decreased pain, dressing was dry, cultures results were not yet  available.  The PCA was discontinued on postoperative day three.  The  patient was without complaint, he was afebrile, cultures showed no  growth, he was alert and oriented, the dressing had moderate drainage,  drain was quiescent and discontinued.  At the time of the  discontinuation of the drain, some clear fluid was present with no odor,  and he was discharged home to the care of his family.  Because of  negative cultures, Infectious Disease recommended no empiric antibiotic  coverage.  The dressing was changed prior to discharge.  He is to return  to the clinic with Dr. Turner Daniels in four days time.  Diet is regular.  Activity is weightbearing as tolerated.  He should, of course, return to  see [place] sooner if he should have any increased temperature, drainage from  the wound, or pain not well-controlled by oral pain medication.  At the  time of his discharge, his medications included Percocet for pain,  Celebrex, Zocor, and Lisinopril.      Laural Benes. Jannet Mantis.      Feliberto Gottron. Turner Daniels, M.D.  Electronically Signed    JBR/MEDQ  D:  04/13/2007  T:  04/14/2007  Job:  [id number]

## 2011-01-20 LAB — BASIC METABOLIC PANEL
BUN: 21 mg/dL (ref 6–23)
CO2: 27 mEq/L (ref 19–32)
Calcium: 8.1 mg/dL — ABNORMAL LOW (ref 8.4–10.5)
Chloride: 97 mEq/L (ref 96–112)
Creatinine, Ser: 1.4 mg/dL (ref 0.4–1.5)
GFR calc Af Amer: >60 mL/min (ref >60)
GFR calc non Af Amer: 53 mL/min — ABNORMAL LOW (ref >60)
Glucose, Bld: 142 mg/dL — ABNORMAL HIGH (ref 70–99)
Potassium: 4.2 mEq/L (ref 3.5–5.1)
Sodium: 138 mEq/L (ref 135–145)

## 2011-01-20 LAB — CBC
HCT: 30.5 % — ABNORMAL LOW (ref 39.0–52.0)
HCT: 32 % — ABNORMAL LOW (ref 39.0–52.0)
HCT: 44.5 % (ref 39.0–52.0)
Hemoglobin: 10.3 g/dL — ABNORMAL LOW (ref 13.0–17.0)
Hemoglobin: 14.8 g/dL (ref 13.0–17.0)
MCHC: 33.3 g/dL (ref 30.0–36.0)
MCHC: 33.6 g/dL (ref 30.0–36.0)
Platelets: 175 10*3/uL (ref 150–400)
Platelets: 187 10*3/uL (ref 150–400)
RBC: 5.22 MIL/uL (ref 4.22–5.81)
RDW: 13.5 % (ref 11.5–15.5)
RDW: 13.7 % (ref 11.5–15.5)
RDW: 13.8 % (ref 11.5–15.5)
RDW: 14.2 % (ref 11.5–15.5)
WBC: 10.2 10*3/uL (ref 4.0–10.5)
WBC: 9.7 10*3/uL (ref 4.0–10.5)

## 2011-01-20 LAB — URINALYSIS, ROUTINE W REFLEX MICROSCOPIC
Glucose, UA: NEGATIVE mg/dL
Ketones, ur: NEGATIVE mg/dL
Nitrite: NEGATIVE
Specific Gravity, Urine: 1.012 (ref 1.005–1.030)
pH: 6.5 (ref 5.0–8.0)

## 2011-01-20 LAB — DIFFERENTIAL
Basophils Absolute: 0.1 10*3/uL (ref 0.0–0.1)
Eosinophils Relative: 2 % (ref 0–5)
Lymphocytes Relative: 24 % (ref 12–46)
Monocytes Absolute: 0.9 10*3/uL (ref 0.1–1.0)
Monocytes Relative: 8 % (ref 3–12)

## 2011-01-20 LAB — PROTIME-INR
INR: 1 (ref 0.00–1.49)
INR: 1.4 (ref 0.00–1.49)
INR: 1.9 — ABNORMAL HIGH (ref 0.00–1.49)
Prothrombin Time: 13.7 seconds (ref 11.6–15.2)
Prothrombin Time: 17.6 seconds — ABNORMAL HIGH (ref 11.6–15.2)

## 2011-01-20 LAB — TYPE AND SCREEN
ABO/RH(D): A POS
Antibody Screen: NEGATIVE

## 2011-01-23 LAB — GRAM STAIN: Gram Stain: NONE SEEN

## 2011-01-23 LAB — CBC
Hemoglobin: 10.4 — ABNORMAL LOW
MCHC: 35
MCV: 82.2
Platelets: 166
Platelets: 169
RBC: 3.66 — ABNORMAL LOW
RDW: 15.3
RDW: 15.8 — ABNORMAL HIGH
WBC: 10.9 — ABNORMAL HIGH
WBC: 8.1

## 2011-01-23 LAB — PROTIME-INR
INR: 1.8 — ABNORMAL HIGH
Prothrombin Time: 13.7
Prothrombin Time: 25.6 — ABNORMAL HIGH

## 2011-01-23 LAB — AFB CULTURE WITH SMEAR (NOT AT ARMC)

## 2011-01-23 LAB — ANAEROBIC CULTURE

## 2011-01-23 LAB — BODY FLUID CULTURE: Gram Stain: NONE SEEN

## 2011-01-24 LAB — COMPREHENSIVE METABOLIC PANEL
ALT: 36
AST: 31
Albumin: 4.2
CO2: 23
Calcium: 9.8
Creatinine, Ser: 1.23
GFR calc Af Amer: >60
GFR calc non Af Amer: >60
Sodium: 135
Total Protein: 7.3

## 2011-01-24 LAB — URINALYSIS, ROUTINE W REFLEX MICROSCOPIC
Glucose, UA: NEGATIVE
Hgb urine dipstick: NEGATIVE
Specific Gravity, Urine: 1.021
pH: 5

## 2011-01-24 LAB — DIFFERENTIAL
Eosinophils Absolute: 0.4
Eosinophils Relative: 5
Lymphocytes Relative: 36
Lymphs Abs: 3.1
Monocytes Absolute: 1
Monocytes Relative: 12

## 2011-01-24 LAB — TYPE AND SCREEN
ABO/RH(D): A POS
Antibody Screen: NEGATIVE

## 2011-01-24 LAB — CBC
MCHC: 33.6
Platelets: 282
RBC: 4.73

## 2011-01-25 LAB — FUNGUS CULTURE W SMEAR: Fungal Smear: NONE SEEN

## 2011-01-25 LAB — CBC
HCT: 31.3 — ABNORMAL LOW
Hemoglobin: 10.5 — ABNORMAL LOW
Hemoglobin: 12 — ABNORMAL LOW
MCHC: 33
MCHC: 33.4
MCV: 84.9
RBC: 3.69 — ABNORMAL LOW
RBC: 4.26
RDW: 14
WBC: 10.7 — ABNORMAL HIGH

## 2011-01-25 LAB — BASIC METABOLIC PANEL
CO2: 28
Calcium: 9.1
Creatinine, Ser: 0.98
GFR calc Af Amer: >60
GFR calc non Af Amer: >60
Sodium: 128 — ABNORMAL LOW

## 2011-01-25 LAB — DIFFERENTIAL
Basophils Absolute: 0.1
Basophils Relative: 1
Lymphocytes Relative: 13
Monocytes Absolute: 1.4 — ABNORMAL HIGH
Monocytes Relative: 10
Neutro Abs: 9.9 — ABNORMAL HIGH
Neutrophils Relative %: 76

## 2011-01-25 LAB — CLOSTRIDIUM DIFFICILE EIA: C difficile Toxins A+B, EIA: NEGATIVE

## 2011-01-25 LAB — CULTURE, ROUTINE-ABSCESS: Culture: NO GROWTH

## 2011-01-25 LAB — ANAEROBIC CULTURE

## 2011-01-25 LAB — C-REACTIVE PROTEIN: CRP: 26.5 — ABNORMAL HIGH (ref <0.6)

## 2011-01-26 LAB — DIFFERENTIAL
Eosinophils Absolute: 0.1
Eosinophils Relative: 1
Lymphs Abs: 1.9
Monocytes Relative: 11

## 2011-01-26 LAB — COMPREHENSIVE METABOLIC PANEL
ALT: 27
AST: 19
Alkaline Phosphatase: 47
CO2: 28
Calcium: 9.2
GFR calc Af Amer: >60
GFR calc non Af Amer: >60
Potassium: 4.5
Sodium: 131 — ABNORMAL LOW

## 2011-01-26 LAB — APTT: aPTT: 43 — ABNORMAL HIGH

## 2011-01-26 LAB — PROTIME-INR: Prothrombin Time: 14.3

## 2011-01-26 LAB — CBC
MCHC: 33.8
RBC: 4.15 — ABNORMAL LOW
WBC: 14.4 — ABNORMAL HIGH

## 2011-01-26 LAB — TYPE AND SCREEN
ABO/RH(D): A POS
Antibody Screen: NEGATIVE

## 2011-01-26 LAB — ABO/RH: ABO/RH(D): A POS

## 2011-03-02 ENCOUNTER — Other Ambulatory Visit: Payer: Self-pay | Admitting: Orthopedic Surgery

## 2011-03-30 ENCOUNTER — Other Ambulatory Visit: Payer: Self-pay | Admitting: Orthopedic Surgery

## 2011-04-14 ENCOUNTER — Encounter (HOSPITAL_COMMUNITY): Payer: Self-pay

## 2011-04-21 ENCOUNTER — Other Ambulatory Visit: Payer: Self-pay

## 2011-04-21 ENCOUNTER — Encounter (HOSPITAL_COMMUNITY): Payer: Self-pay

## 2011-04-21 ENCOUNTER — Encounter (HOSPITAL_COMMUNITY)
Admission: RE | Admit: 2011-04-21 | Discharge: 2011-04-21 | Disposition: A | Discharge status: Home / Self Care | Payer: BC Managed Care – PPO | Source: Ambulatory Visit | Attending: Orthopedic Surgery | Admitting: Orthopedic Surgery

## 2011-04-21 ENCOUNTER — Encounter (HOSPITAL_COMMUNITY)
Admission: RE | Admit: 2011-04-21 | Discharge: 2011-04-21 | Disposition: A | Discharge status: Home / Self Care | Payer: BC Managed Care – PPO | Source: Ambulatory Visit | Attending: Anesthesiology | Admitting: Anesthesiology

## 2011-04-21 HISTORY — DX: Hyperlipidemia, unspecified: E78.5

## 2011-04-21 HISTORY — DX: Essential (primary) hypertension: I10

## 2011-04-21 LAB — DIFFERENTIAL
Lymphs Abs: 2.3 10*3/uL (ref 0.7–4.0)
Monocytes Absolute: 1.1 10*3/uL — ABNORMAL HIGH (ref 0.1–1.0)
Monocytes Relative: 10 % (ref 3–12)
Neutro Abs: 8.4 10*3/uL — ABNORMAL HIGH (ref 1.7–7.7)
Neutrophils Relative %: 70 % (ref 43–77)

## 2011-04-21 LAB — CBC
HCT: 40.6 % (ref 39.0–52.0)
Hemoglobin: 14.1 g/dL (ref 13.0–17.0)
MCH: 29.1 pg (ref 26.0–34.0)
MCHC: 34.7 g/dL (ref 30.0–36.0)
RBC: 4.85 MIL/uL (ref 4.22–5.81)

## 2011-04-21 LAB — URINALYSIS, ROUTINE W REFLEX MICROSCOPIC
Ketones, ur: NEGATIVE mg/dL
Leukocytes, UA: NEGATIVE
Nitrite: NEGATIVE
Protein, ur: NEGATIVE mg/dL
Urobilinogen, UA: 0.2 mg/dL (ref 0.0–1.0)

## 2011-04-21 LAB — BASIC METABOLIC PANEL
BUN: 22 mg/dL (ref 6–23)
Chloride: 96 mEq/L (ref 96–112)
GFR calc non Af Amer: 77 mL/min — ABNORMAL LOW (ref >90)
Glucose, Bld: 99 mg/dL (ref 70–99)
Potassium: 4.7 mEq/L (ref 3.5–5.1)
Sodium: 132 mEq/L — ABNORMAL LOW (ref 135–145)

## 2011-04-21 LAB — TYPE AND SCREEN: ABO/RH(D): A POS

## 2011-04-21 LAB — SURGICAL PCR SCREEN: MRSA, PCR: NEGATIVE

## 2011-04-21 LAB — PROTIME-INR: INR: 0.98 (ref 0.00–1.49)

## 2011-04-21 MED ORDER — CHLORHEXIDINE GLUCONATE 4 % EX LIQD: 60.0000 mL | Freq: Once | CUTANEOUS | Status: DC

## 2011-04-21 NOTE — Pre-Procedure Instructions (Signed)
20 [name]  04/21/2011   Your procedure is scheduled on:  May 01, 2011  Report to [facility] at 0530 AM.  Call this number if you have problems the morning of surgery: [phone]   Remember:   Do not eat food:After Midnight.  May have clear liquids: up to 4 Hours before arrival.  Clear liquids include soda, tea, black coffee, apple or grape juice, broth.  Take these medicines the morning of surgery with A SIP OF WATER: pain pill, stop aspirin   Do not wear jewelry, make-up or nail polish.  Do not wear lotions, powders, or perfumes. You may wear deodorant.  Do not shave 48 hours prior to surgery.  Do not bring valuables to the hospital.  Contacts, dentures or bridgework may not be worn into surgery.  Leave suitcase in the car. After surgery it may be brought to your room.  For patients admitted to the hospital, checkout time is 11:00 AM the day of discharge.   Patients discharged the day of surgery will not be allowed to drive home.  Name and phone number of your driver: NA  Special Instructions: Incentive Spirometry - Practice and bring it with you on the day of surgery. and CHG Shower Use Special Wash: 1/2 bottle night before surgery and 1/2 bottle morning of surgery.   Please read over the following fact sheets that you were given: Pain Booklet, Blood Transfusion Information, Total Joint Packet and Surgical Site Infection Prevention

## 2011-04-24 NOTE — Consult Note (Signed)
Anesthesia:  Patient is a 56 year old male scheduled for a left THA on 05/01/11.  His history is significant for obesity, HTN, OSA, and HLD.    Preoperative CXR shows no active disease. Mild degenerative changes thoracic spine.  EKG shows NSR.  He had a stress test on 06/23/10 (see Notes tab) which was normal with an EF of 60%.  An echocardiogram was done at [facility] in August of 2008 and showed mild LVH with impaired relaxation, normal LV systolic function, mild aortic valve sclerosis with no gradient.     Dr. Theressa Millard follows his OSA.  Last study was 10/02/09.  CPAP at 16 recommended.   Lab acceptable for OR.  Plan to proceed.

## 2011-04-27 NOTE — H&P (Signed)
  HISTORY OF PRESENT ILLNESS:  [name] is a 56 year old patient who reports significant left hip pain that is in the groin, going back to the buttock, very similar to the pain that he had in his right hip before he had his replacement done back in the 1990s.  His most recent cortisone injection with Dr. Regino Schultze lasted for about a month, but it has worn off quickly, and he is worried he may need to have his hip replaced.   He reports pain at rest that wakes him up at night. He is miserable. He has already had his right hip and both knees replaced.  He has lost 70 pounds through diet and exercise and currently weighs about 300 pounds.    ROS: Patient denies dizziness, nausea, fever, chills, vomiting, shortness of breath, chest pain, loss of appetite, or rash.    PHYSICAL EXAM: Well-developed, well-nourished.  Awake, alert, and oriented x3.  Extraocular motion is intact.  No use of accessory respiratory muscles for breathing.   Cardiovascular exam reveals a regular rhythm.  Skin is intact without cuts, scrapes, or abrasions.   He has significant pain with internal rotation of the left hip.  Walks with a left-sided limp.  Adduction with internal rotation definitely exacerbates his pain.   RADIOGRAPHS:  X-rays were ordered, performed, and interpreted by me today.  Plain radiographs show a dramatic change from last year.  He has lost all the articular cartilage in the weightbearing area of the left hip.  He is bone on bone with some erosion.  IMPRESSION:  End-stage arthritis, left hip.  PLAN:  We will get him set up for a left total hip arthroplasty.  Risks and benefits of surgery were reviewed and are well known to the patient.  He has Norco for pain control and may call in for a refill if he needs it.  He did not need a refill today.  I will see him back sooner if he has any difficulties.

## 2011-04-30 DIAGNOSIS — M1610 Unilateral primary osteoarthritis, unspecified hip: Secondary | ICD-10-CM | POA: Diagnosis present

## 2011-05-01 ENCOUNTER — Encounter (HOSPITAL_COMMUNITY)
Admission: RE | Disposition: A | Discharge status: Home Health | Payer: Self-pay | Source: Ambulatory Visit | Attending: Orthopedic Surgery

## 2011-05-01 ENCOUNTER — Ambulatory Visit (HOSPITAL_COMMUNITY): Payer: BC Managed Care – PPO

## 2011-05-01 ENCOUNTER — Encounter (HOSPITAL_COMMUNITY): Payer: Self-pay | Admitting: *Deleted

## 2011-05-01 ENCOUNTER — Ambulatory Visit (HOSPITAL_COMMUNITY): Payer: BC Managed Care – PPO | Admitting: Vascular Surgery

## 2011-05-01 ENCOUNTER — Encounter (HOSPITAL_COMMUNITY): Payer: Self-pay | Admitting: Vascular Surgery

## 2011-05-01 ENCOUNTER — Inpatient Hospital Stay (HOSPITAL_COMMUNITY)
Admission: RE | Admit: 2011-05-01 | Discharge: 2011-05-03 | DRG: 818 | Disposition: A | Discharge status: Home Health | Payer: BC Managed Care – PPO | Source: Ambulatory Visit | Attending: Orthopedic Surgery | Admitting: Orthopedic Surgery

## 2011-05-01 DIAGNOSIS — Z96659 Presence of unspecified artificial knee joint: Secondary | ICD-10-CM

## 2011-05-01 DIAGNOSIS — M169 Osteoarthritis of hip, unspecified: Principal | ICD-10-CM | POA: Diagnosis present

## 2011-05-01 DIAGNOSIS — E785 Hyperlipidemia, unspecified: Secondary | ICD-10-CM | POA: Diagnosis present

## 2011-05-01 DIAGNOSIS — M1610 Unilateral primary osteoarthritis, unspecified hip: Principal | ICD-10-CM | POA: Diagnosis present

## 2011-05-01 HISTORY — PX: TOTAL HIP ARTHROPLASTY: SHX124

## 2011-05-01 SURGERY — ARTHROPLASTY, HIP, TOTAL,POSTERIOR APPROACH
Anesthesia: General | Laterality: Left

## 2011-05-01 MED ORDER — ROCURONIUM BROMIDE 100 MG/10ML IV SOLN
INTRAVENOUS | Status: DC | PRN
  Administered 2011-05-01: 50 mg via INTRAVENOUS

## 2011-05-01 MED ORDER — FENTANYL CITRATE 0.05 MG/ML IJ SOLN
INTRAMUSCULAR | Status: DC | PRN
  Administered 2011-05-01: 50 ug via INTRAVENOUS
  Administered 2011-05-01: 100 ug via INTRAVENOUS
  Administered 2011-05-01 (×4): 50 ug via INTRAVENOUS

## 2011-05-01 MED ORDER — DIPHENHYDRAMINE HCL 12.5 MG/5ML PO ELIX
12.5000 mg | ORAL_SOLUTION | ORAL | Status: DC | PRN
  Filled 2011-05-01: qty 10

## 2011-05-01 MED ORDER — SENNOSIDES-DOCUSATE SODIUM 8.6-50 MG PO TABS: 1.0000 | ORAL_TABLET | Freq: Every evening | ORAL | Status: DC | PRN

## 2011-05-01 MED ORDER — METOCLOPRAMIDE HCL 10 MG PO TABS: 5.0000 mg | ORAL_TABLET | Freq: Three times a day (TID) | ORAL | Status: DC | PRN

## 2011-05-01 MED ORDER — WARFARIN VIDEO: Freq: Once | Status: DC

## 2011-05-01 MED ORDER — OLOPATADINE HCL 0.1 % OP SOLN
1.0000 [drp] | Freq: Two times a day (BID) | OPHTHALMIC | Status: DC | PRN
  Filled 2011-05-01: qty 5

## 2011-05-01 MED ORDER — FLEET ENEMA 7-19 GM/118ML RE ENEM: 1.0000 | ENEMA | Freq: Once | RECTAL | Status: AC | PRN

## 2011-05-01 MED ORDER — METHOCARBAMOL 100 MG/ML IJ SOLN
500.0000 mg | INTRAVENOUS | Status: AC
  Filled 2011-05-01: qty 5

## 2011-05-01 MED ORDER — HYDROMORPHONE HCL PF 1 MG/ML IJ SOLN
0.5000 mg | INTRAMUSCULAR | Status: DC | PRN
  Administered 2011-05-01 – 2011-05-02 (×5): 1 mg via INTRAVENOUS
  Filled 2011-05-01 (×4): qty 1

## 2011-05-01 MED ORDER — KCL IN DEXTROSE-NACL 20-5-0.45 MEQ/L-%-% IV SOLN
INTRAVENOUS | Status: DC
  Administered 2011-05-01: 1000 mL via INTRAVENOUS
  Administered 2011-05-01 – 2011-05-03 (×4): via INTRAVENOUS
  Filled 2011-05-01 (×8): qty 1000

## 2011-05-01 MED ORDER — ONDANSETRON HCL 4 MG/2ML IJ SOLN
INTRAMUSCULAR | Status: DC | PRN
  Administered 2011-05-01: 4 mg via INTRAVENOUS

## 2011-05-01 MED ORDER — ROSUVASTATIN CALCIUM 20 MG PO TABS
20.0000 mg | ORAL_TABLET | Freq: Every day | ORAL | Status: DC
  Administered 2011-05-01 – 2011-05-02 (×2): 20 mg via ORAL
  Filled 2011-05-01 (×3): qty 1

## 2011-05-01 MED ORDER — CELECOXIB 200 MG PO CAPS
400.0000 mg | ORAL_CAPSULE | Freq: Every day | ORAL | Status: DC
  Administered 2011-05-01 – 2011-05-03 (×3): 400 mg via ORAL
  Filled 2011-05-01 (×3): qty 2

## 2011-05-01 MED ORDER — HYDROCODONE-ACETAMINOPHEN 5-325 MG PO TABS
1.0000 | ORAL_TABLET | ORAL | Status: DC | PRN
  Administered 2011-05-01 (×2): 1 via ORAL
  Filled 2011-05-01: qty 1
  Filled 2011-05-01: qty 2

## 2011-05-01 MED ORDER — METHOCARBAMOL 100 MG/ML IJ SOLN
500.0000 mg | INTRAVENOUS | Status: AC
  Administered 2011-05-01: 500 mg via INTRAVENOUS
  Filled 2011-05-01: qty 5

## 2011-05-01 MED ORDER — ACETAMINOPHEN 10 MG/ML IV SOLN: 1000.0000 mg | Freq: Once | INTRAVENOUS | Status: DC | PRN

## 2011-05-01 MED ORDER — LISINOPRIL-HYDROCHLOROTHIAZIDE 20-12.5 MG PO TABS: 2.0000 | ORAL_TABLET | Freq: Every day | ORAL | Status: DC

## 2011-05-01 MED ORDER — WARFARIN SODIUM 7.5 MG PO TABS
7.5000 mg | ORAL_TABLET | Freq: Once | ORAL | Status: AC
  Administered 2011-05-01: 7.5 mg via ORAL
  Filled 2011-05-01: qty 1

## 2011-05-01 MED ORDER — BRIMONIDINE TARTRATE 0.2 % OP SOLN
1.0000 [drp] | Freq: Two times a day (BID) | OPHTHALMIC | Status: DC
  Administered 2011-05-01 – 2011-05-03 (×5): 1 [drp] via OPHTHALMIC
  Filled 2011-05-01 (×2): qty 5

## 2011-05-01 MED ORDER — OXYCODONE HCL 5 MG PO TABS
5.0000 mg | ORAL_TABLET | ORAL | Status: DC | PRN
  Administered 2011-05-02: 5 mg via ORAL
  Administered 2011-05-02 (×2): 10 mg via ORAL
  Administered 2011-05-02: 5 mg via ORAL
  Administered 2011-05-02 – 2011-05-03 (×5): 10 mg via ORAL
  Filled 2011-05-01 (×7): qty 2
  Filled 2011-05-01 (×2): qty 1

## 2011-05-01 MED ORDER — ALUMINUM HYDROXIDE GEL 320 MG/5ML PO SUSP
15.0000 mL | ORAL | Status: DC | PRN
  Filled 2011-05-01: qty 30

## 2011-05-01 MED ORDER — PHENYLEPHRINE HCL 10 MG/ML IJ SOLN
INTRAMUSCULAR | Status: DC | PRN
  Administered 2011-05-01: 80 ug via INTRAVENOUS
  Administered 2011-05-01: 160 ug via INTRAVENOUS

## 2011-05-01 MED ORDER — MIDAZOLAM HCL 5 MG/5ML IJ SOLN
INTRAMUSCULAR | Status: DC | PRN
  Administered 2011-05-01: 1 mg via INTRAVENOUS

## 2011-05-01 MED ORDER — ONDANSETRON HCL 4 MG/2ML IJ SOLN: 4.0000 mg | Freq: Once | INTRAMUSCULAR | Status: DC | PRN

## 2011-05-01 MED ORDER — CEFAZOLIN SODIUM 1-5 GM-% IV SOLN
INTRAVENOUS | Status: AC
  Filled 2011-05-01: qty 100

## 2011-05-01 MED ORDER — METHOCARBAMOL 100 MG/ML IJ SOLN
500.0000 mg | Freq: Four times a day (QID) | INTRAVENOUS | Status: DC | PRN
  Administered 2011-05-01 – 2011-05-02 (×2): 500 mg via INTRAVENOUS
  Filled 2011-05-01 (×3): qty 5

## 2011-05-01 MED ORDER — METOCLOPRAMIDE HCL 5 MG/ML IJ SOLN
5.0000 mg | Freq: Three times a day (TID) | INTRAMUSCULAR | Status: DC | PRN
  Filled 2011-05-01: qty 2

## 2011-05-01 MED ORDER — METHOCARBAMOL 500 MG PO TABS
500.0000 mg | ORAL_TABLET | Freq: Four times a day (QID) | ORAL | Status: DC | PRN
  Administered 2011-05-02 – 2011-05-03 (×4): 500 mg via ORAL
  Filled 2011-05-01 (×4): qty 1

## 2011-05-01 MED ORDER — BUPIVACAINE-EPINEPHRINE 0.5% -1:200000 IJ SOLN
INTRAMUSCULAR | Status: DC | PRN
  Administered 2011-05-01: 18 mL

## 2011-05-01 MED ORDER — ALBUMIN HUMAN 5 % IV SOLN
INTRAVENOUS | Status: DC | PRN
  Administered 2011-05-01 (×2): via INTRAVENOUS

## 2011-05-01 MED ORDER — BISACODYL 5 MG PO TBEC: 5.0000 mg | DELAYED_RELEASE_TABLET | Freq: Every day | ORAL | Status: DC | PRN

## 2011-05-01 MED ORDER — ACETAMINOPHEN 10 MG/ML IV SOLN
INTRAVENOUS | Status: AC
  Filled 2011-05-01: qty 100

## 2011-05-01 MED ORDER — CEFAZOLIN SODIUM-DEXTROSE 2-3 GM-% IV SOLR
2.0000 g | INTRAVENOUS | Status: AC
  Administered 2011-05-01: 2 g via INTRAVENOUS
  Filled 2011-05-01: qty 50

## 2011-05-01 MED ORDER — MENTHOL 3 MG MT LOZG
1.0000 | LOZENGE | OROMUCOSAL | Status: DC | PRN
  Filled 2011-05-01: qty 9

## 2011-05-01 MED ORDER — SODIUM CHLORIDE 0.9 % IR SOLN
Status: DC | PRN
  Administered 2011-05-01: 1000 mL

## 2011-05-01 MED ORDER — LISINOPRIL 40 MG PO TABS
40.0000 mg | ORAL_TABLET | Freq: Every day | ORAL | Status: DC
  Administered 2011-05-01 – 2011-05-02 (×2): 40 mg via ORAL
  Filled 2011-05-01 (×3): qty 1

## 2011-05-01 MED ORDER — HYDROMORPHONE HCL PF 1 MG/ML IJ SOLN
INTRAMUSCULAR | Status: AC
  Administered 2011-05-01: 1 mg via INTRAVENOUS
  Filled 2011-05-01: qty 1

## 2011-05-01 MED ORDER — SIMVASTATIN 40 MG PO TABS: 40.0000 mg | ORAL_TABLET | Freq: Every day | ORAL | Status: DC

## 2011-05-01 MED ORDER — PROPOFOL 10 MG/ML IV EMUL
INTRAVENOUS | Status: DC | PRN
  Administered 2011-05-01: 200 mg via INTRAVENOUS

## 2011-05-01 MED ORDER — PHENOL 1.4 % MT LIQD
1.0000 | OROMUCOSAL | Status: DC | PRN
  Filled 2011-05-01: qty 177

## 2011-05-01 MED ORDER — COUMADIN BOOK
Freq: Once | Status: AC
  Administered 2011-05-01: 15:00:00
  Filled 2011-05-01: qty 1

## 2011-05-01 MED ORDER — ACETAMINOPHEN 10 MG/ML IV SOLN
INTRAVENOUS | Status: DC | PRN
  Administered 2011-05-01: 1000 mg via INTRAVENOUS

## 2011-05-01 MED ORDER — ZOLPIDEM TARTRATE 5 MG PO TABS
5.0000 mg | ORAL_TABLET | Freq: Every evening | ORAL | Status: DC | PRN
  Administered 2011-05-02 (×2): 5 mg via ORAL
  Filled 2011-05-01 (×2): qty 1

## 2011-05-01 MED ORDER — THERA M PLUS PO TABS
1.0000 | ORAL_TABLET | Freq: Every day | ORAL | Status: DC
  Administered 2011-05-01 – 2011-05-03 (×3): 1 via ORAL
  Filled 2011-05-01 (×3): qty 1

## 2011-05-01 MED ORDER — OLOPATADINE HCL 0.2 % OP SOLN: 1.0000 [drp] | Freq: Every day | OPHTHALMIC | Status: DC | PRN

## 2011-05-01 MED ORDER — LACTATED RINGERS IV SOLN
INTRAVENOUS | Status: DC | PRN
  Administered 2011-05-01 (×4): via INTRAVENOUS

## 2011-05-01 MED ORDER — HYDROCHLOROTHIAZIDE 25 MG PO TABS
25.0000 mg | ORAL_TABLET | Freq: Every day | ORAL | Status: DC
  Administered 2011-05-01 – 2011-05-02 (×2): 25 mg via ORAL
  Filled 2011-05-01 (×3): qty 1

## 2011-05-01 MED ORDER — HYDROMORPHONE HCL PF 1 MG/ML IJ SOLN
0.2500 mg | INTRAMUSCULAR | Status: DC | PRN
  Administered 2011-05-01 (×4): 0.5 mg via INTRAVENOUS

## 2011-05-01 MED ORDER — METHOCARBAMOL 100 MG/ML IJ SOLN
500.0000 mg | INTRAMUSCULAR | Status: DC
  Filled 2011-05-01: qty 5

## 2011-05-01 MED ORDER — EPHEDRINE SULFATE 50 MG/ML IJ SOLN
INTRAMUSCULAR | Status: DC | PRN
  Administered 2011-05-01 (×5): 10 mg via INTRAVENOUS

## 2011-05-01 SURGICAL SUPPLY — 54 items
BLADE SAW SAG 73X25 THK (BLADE) ×1
BLADE SAW SGTL 18X1.27X75 (BLADE) IMPLANT
BLADE SAW SGTL 73X25 THK (BLADE) ×1 IMPLANT
BLADE SAW SGTL MED 73X18.5 STR (BLADE) IMPLANT
BRUSH FEMORAL CANAL (MISCELLANEOUS) IMPLANT
CLOTH BEACON ORANGE TIMEOUT ST (SAFETY) ×2 IMPLANT
COVER BACK TABLE 24X17X13 BIG (DRAPES) IMPLANT
COVER SURGICAL LIGHT HANDLE (MISCELLANEOUS) ×4 IMPLANT
DRAPE ORTHO SPLIT 77X108 STRL (DRAPES) ×1
DRAPE PROXIMA HALF (DRAPES) ×2 IMPLANT
DRAPE SURG ORHT 6 SPLT 77X108 (DRAPES) ×1 IMPLANT
DRAPE U-SHAPE 47X51 STRL (DRAPES) ×2 IMPLANT
DRILL BIT 7/64X5 (BIT) ×2 IMPLANT
DRSG MEPILEX BORDER 4X12 (GAUZE/BANDAGES/DRESSINGS) ×2 IMPLANT
DRSG MEPILEX BORDER 4X8 (GAUZE/BANDAGES/DRESSINGS) ×2 IMPLANT
DURAPREP 26ML APPLICATOR (WOUND CARE) ×2 IMPLANT
ELECT BLADE 4.0 EZ CLEAN MEGAD (MISCELLANEOUS)
ELECT REM PT RETURN 9FT ADLT (ELECTROSURGICAL) ×2
ELECTRODE BLDE 4.0 EZ CLN MEGD (MISCELLANEOUS) IMPLANT
ELECTRODE REM PT RTRN 9FT ADLT (ELECTROSURGICAL) ×1 IMPLANT
FLOSEAL 10ML (HEMOSTASIS) IMPLANT
GAUZE XEROFORM 1X8 LF (GAUZE/BANDAGES/DRESSINGS) ×2 IMPLANT
GLOVE BIO SURGEON STRL SZ7 (GLOVE) ×2 IMPLANT
GLOVE BIO SURGEON STRL SZ7.5 (GLOVE) ×2 IMPLANT
GLOVE BIOGEL PI IND STRL 7.0 (GLOVE) ×1 IMPLANT
GLOVE BIOGEL PI IND STRL 8 (GLOVE) ×1 IMPLANT
GLOVE BIOGEL PI INDICATOR 7.0 (GLOVE) ×1
GLOVE BIOGEL PI INDICATOR 8 (GLOVE) ×1
GOWN PREVENTION PLUS XLARGE (GOWN DISPOSABLE) ×2 IMPLANT
GOWN STRL NON-REIN LRG LVL3 (GOWN DISPOSABLE) ×4 IMPLANT
HANDPIECE INTERPULSE COAX TIP (DISPOSABLE)
HOOD PEEL AWAY FACE SHEILD DIS (HOOD) ×4 IMPLANT
KIT BASIN OR (CUSTOM PROCEDURE TRAY) ×2 IMPLANT
KIT ROOM TURNOVER OR (KITS) ×2 IMPLANT
MANIFOLD NEPTUNE II (INSTRUMENTS) ×2 IMPLANT
NEEDLE 22X1 1/2 (OR ONLY) (NEEDLE) ×2 IMPLANT
NS IRRIG 1000ML POUR BTL (IV SOLUTION) ×2 IMPLANT
PACK TOTAL JOINT (CUSTOM PROCEDURE TRAY) ×2 IMPLANT
PAD ARMBOARD 7.5X6 YLW CONV (MISCELLANEOUS) ×4 IMPLANT
PASSER SUT SWANSON 36MM LOOP (INSTRUMENTS) ×2 IMPLANT
PRESSURIZER FEMORAL UNIV (MISCELLANEOUS) IMPLANT
SET HNDPC FAN SPRY TIP SCT (DISPOSABLE) IMPLANT
SUT ETHIBOND 2 V 37 (SUTURE) ×2 IMPLANT
SUT ETHILON 3 0 FSL (SUTURE) ×2 IMPLANT
SUT VIC AB 0 CTB1 27 (SUTURE) ×2 IMPLANT
SUT VIC AB 1 CTX 36 (SUTURE) ×1
SUT VIC AB 1 CTX36XBRD ANBCTR (SUTURE) ×1 IMPLANT
SUT VIC AB 2-0 CTB1 (SUTURE) ×2 IMPLANT
SYR CONTROL 10ML LL (SYRINGE) ×2 IMPLANT
TOWEL OR 17X24 6PK STRL BLUE (TOWEL DISPOSABLE) ×2 IMPLANT
TOWEL OR 17X26 10 PK STRL BLUE (TOWEL DISPOSABLE) ×2 IMPLANT
TOWER CARTRIDGE SMART MIX (DISPOSABLE) IMPLANT
TRAY FOLEY CATH 14FR (SET/KITS/TRAYS/PACK) IMPLANT
WATER STERILE IRR 1000ML POUR (IV SOLUTION) ×8 IMPLANT

## 2011-05-01 NOTE — Preoperative (Signed)
Beta Blockers   Reason not to administer Beta Blockers:Not Applicable 

## 2011-05-01 NOTE — Interval H&P Note (Signed)
History and Physical Interval Note:  05/01/2011 7:22 AM  [name]  has presented today for surgery, with the diagnosis of OSTEOARTHRITIS LEFT HIP  The various methods of treatment have been discussed with the patient and family. After consideration of risks, benefits and other options for treatment, the patient has consented to  Procedure(s): TOTAL HIP ARTHROPLASTY as a surgical intervention .  The patients' history has been reviewed, patient examined, no change in status, stable for surgery.  I have reviewed the patients' chart and labs.  Questions were answered to the patient's satisfaction.     Nestor Lewandowsky

## 2011-05-01 NOTE — Anesthesia Preprocedure Evaluation (Signed)
Anesthesia Evaluation  Patient identified by MRN, date of birth, ID band Patient awake    Reviewed: Allergy & Precautions, H&P , NPO status   Airway Mallampati: III TM Distance: >3 FB Neck ROM: Full    Dental  (+) Teeth Intact and Dental Advisory Given   Pulmonary  clear to auscultation        Cardiovascular Regular Normal    Neuro/Psych    GI/Hepatic   Endo/Other    Renal/GU      Musculoskeletal   Abdominal (+) obese,  Abdomen: soft.    Peds  Hematology   Anesthesia Other Findings   Reproductive/Obstetrics                           Anesthesia Physical Anesthesia Plan  ASA: III  Anesthesia Plan: General   Post-op Pain Management:    Induction: Intravenous  Airway Management Planned: Oral ETT  Additional Equipment:   Intra-op Plan:   Post-operative Plan: Extubation in OR  Informed Consent: I have reviewed the patients History and Physical, chart, labs and discussed the procedure including the risks, benefits and alternatives for the proposed anesthesia with the patient or authorized representative who has indicated his/her understanding and acceptance.   Dental advisory given  Plan Discussed with: CRNA and Surgeon  Anesthesia Plan Comments: (DJD L. Hip Htn Obesity  Plan GA with ETT  Kipp Brood, MD)        Anesthesia Quick Evaluation

## 2011-05-01 NOTE — Progress Notes (Signed)
Orthopedic Tech Progress Note Patient Details:  [name] Sep 21, 1955 [id number] Viewed order from rn patient orders     [name] 05/01/2011, 8:11 PM

## 2011-05-01 NOTE — Plan of Care (Signed)
Problem: Consults Goal: Diagnosis- Total Joint Replacement Outcome: Completed/Met Date Met:  05/01/11 Primary Total Hip

## 2011-05-01 NOTE — Op Note (Signed)
OPERATIVE REPORT    DATE OF PROCEDURE:  05/01/2011       PREOPERATIVE DIAGNOSIS:  OSTEOARTHRITIS LEFT HIP                                                       Estimated Body mass index is 47.47 kg/(m^2) as calculated from the following:   Height as of 06/23/10: 6\' 0" (1.829 m).   Weight as of 06/23/10: 350 lb(158.759 kg).     POSTOPERATIVE DIAGNOSIS:  osteoarthritis left hip                                                            PROCEDURE:  L total hip arthroplasty using a 56 mm DePuy Pinnacle  Cup, [employer], 10-degree polyethylene liner index superior  and posterior, a +0 36 mm ceramic head, a #[id number] SROM stem, 18Fxxl Cone   SURGEON: Daxen Lanum J    ASSISTANT:   Mauricia Area, PA-C  (present throughout entire procedure and necessary for timely completion of the procedure)   ANESTHESIA:  General  BLOOD LOSS: 1100cc FLUID REPLACEMENT: 2000 crystalloid DRAINS: Foley Catheter URINE OUTPUT: 300 COMPLICATIONS:  None    INDICATIONS FOR PROCEDURE: A 56 y.o. year-old male With  OSTEOARTHRITIS LEFT HIP   for 5 years, x-rays show bone-on-bone arthritic changes. Despite conservative measures with observation, anti-inflammatory medicine, narcotics, use of a cane, has severe unremitting pain and can ambulate only a few blocks before resting.  Patient desires elective L total hip arthroplasty to decrease pain and increase function. The risks, benefits, and alternatives were discussed at length including but not limited to the risks of infection, bleeding, nerve injury, stiffness, blood clots, the need for revision surgery, cardiopulmonary complications, among others, and they were willing to proceed.y have been discussed. Questions answered.     PROCEDURE IN DETAIL: The patient was identified by armband,  received preoperative IV antibiotics in the holding area at [facility], taken to the operating room , appropriate anesthetic monitors  were attached and general  endotracheal anesthesia induced. Foley catheter was inserted. He was rolled into the R lateral decubitus position and fixed there with a Stulberg Mark II pelvic clamp and the L lower extremity was then prepped and draped  in the usual sterile fashion from the ankle to the hemipelvis. A time-out  procedure was performed. The skin along the lateral hip and thigh  infiltrated with 10 mL of 0.5% Marcaine and epinephrine solution. We  then made a posterolateral approach to the hip. With a #10 blade, 20 cm  incision through skin and subcutaneous tissue down to the level of the  IT band. Small bleeders were identified and cauterized. IT band cut in  line with skin incision exposing the greater trochanter. A Cobra retractor was placed between the gluteus minimus and the superior hip joint capsule, and a spiked Cobra between the quadratus femoris and the inferior hip joint capsule. This isolated the short  external rotators and piriformis tendons. These were tagged with a #2 Ethibond  suture and cut off their insertion on the intertrochanteric crest. The posterior  capsule was then developed  into an acetabular-based flap from Posterior Superior off of the acetabulum out over the femoral neck and back posterior inferior to the acetabular rim. This flap was tagged with two #2 Ethibond sutures and retracted protecting the sciatic nerve. This exposed the arthritic femoral head and osteophytes. The hip was then flexed and internally rotated, dislocating the femoral head and a standard neck cut performed 1 fingerbreadth above the lesser trochanter.  A spiked Cobra was placed in the cotyloid notch and a Hohmann retractor was then used to lever the femur anteriorly off of the anterior pelvic column. A posterior-inferior wing retractor was placed at the junction of the acetabulum and the ischium completing the acetabular exposure.We then removed the peripheral osteophytes and labrum from the acetabulum. We then reamed the  acetabulum up to 55 mm with basket reamers obtaining good coverage in all quadrants, irrigated out with normal  saline solution and hammered into place a 56 mm pinnacle cup in 45  degrees of abduction and about 20 degrees of anteversion. More  peripheral osteophytes removed and a trial 10-degree liner placed with the  index superior-posterior. The hip was then flexed and internally rotated exposing the  proximal femur, which was entered with the initiating reamer followed by  the axial reamers up to a 13.5 mm full depth and 14mm partial depth. We then conically reamed to 58F to the correct depth for a 42 base neck. The calcar was milled to 58FxxL. A trial cone and stem was inserted in the 25 degrees anteversion, with a +0 36mm trial head. Trial reduction was then performed and excellent stability was noted with at 90 of flexion with 70 of internal rotation and then full extension with maximal external rotation. The hip could not be dislocated in full extension. The knee could easily flex  to about 130 degrees. We also stretched the abductors at this point,  because of the preexisting adductor contractures. All trial components  were then removed. The acetabulum was irrigated out with normal saline  solution. A titanium [facility] was then screwed into place  followed by a 10-degree polyethylene liner index superior-posterior. On  the femoral side a 58Fxxl ZTT1 cone was hammered into place, followed by a [id number] SROM stem in 25 degrees of anteversion. At this point, a +0 36 mm ceramic head was  hammered on the stem. The hip was reduced. We checked our stability  one more time and found to be excellent. The wound was once again  thoroughly irrigated out with normal saline solution pulse lavage. The  capsular flap and short external rotators were repaired back to the  intertrochanteric crest through drill holes with a #2 Ethibond suture.  The IT band was closed with running 1 Vicryl  suture. The subcutaneous  tissue with 0 and 2-0 undyed Vicryl suture and the skin with running  interlocking 3-0 nylon suture. Dressing of Xeroform and Mepilex was  then applied. The patient was then unclamped, rolled supine, awaken extubated and taken to recovery room without difficulty in stable condition.   Romaine Maciolek J 05/01/2011, 9:42 AM

## 2011-05-01 NOTE — Progress Notes (Signed)
ANTICOAGULATION CONSULT NOTE - Initial Consult  Pharmacy Consult for coumadin Indication: VTE prophylaxis  Assessment: 58 YOM s/p  L Hip THA (POD#0).  Baseline INR = 0.98 and baseline CBC WNL.  Note- patient is on celebrex and can increase risk of bleeding.   Goal of Therapy:  INR 2-3   Plan:  1. Coumadin 7.5mg  PO x 1 tonight 2. Daily INR 3. Coumadin education materials  No Known Allergies   Vital Signs: Temp: 98.4 F (36.9 C) (01/14 1203) Temp src: Oral (01/14 0606) BP: 119/75 mmHg (01/14 1203) Pulse Rate: 99  (01/14 1203)  Labs: No results found for this basename: HGB:2,HCT:3,PLT:3,APTT:3,LABPROT:3,INR:3,HEPARINUNFRC:3,CREATININE:3,CKTOTAL:3,CKMB:3,TROPONINI:3 in the last 72 hours The CrCl is unknown because both a height and weight (above a minimum accepted value) are required for this calculation.  Medical History: Past Medical History  Diagnosis Date  . Hypertension   . Sleep apnea   . Hyperlipemia     Medications:  Prescriptions prior to admission  Medication Sig Dispense Refill  . aspirin 325 MG tablet Take 325 mg by mouth daily.        Marland Kitchen atorvastatin (LIPITOR) 40 MG tablet Take 40 mg by mouth daily.        . brimonidine (ALPHAGAN) 0.2 % ophthalmic solution Place 1 drop into the right eye 2 (two) times daily.        . celecoxib (CELEBREX) 200 MG capsule Take 400 mg by mouth daily.        . Cyanocobalamin (VITAMIN B-12) 2000 MCG TBCR Take 1 tablet by mouth daily.        Marland Kitchen lisinopril-hydrochlorothiazide (PRINZIDE,ZESTORETIC) 20-12.5 MG per tablet Take 2 tablets by mouth daily.        . Multiple Vitamins-Minerals (MULTIVITAMINS THER. W/MINERALS) TABS Take 1 tablet by mouth daily.        . Olopatadine HCl (PATADAY) 0.2 % SOLN Place 1 drop into both eyes daily as needed. For allergies.       Marland Kitchen oxyCODONE-acetaminophen (PERCOCET) 5-325 MG per tablet Take 1 tablet by mouth 3 (three) times daily as needed. For pain.           Dannielle Huh 05/01/2011,12:21  PM

## 2011-05-01 NOTE — Progress Notes (Signed)
Rec'd report from [facility], assuming care of patient

## 2011-05-01 NOTE — Anesthesia Postprocedure Evaluation (Signed)
  Anesthesia Post-op Note  Patient: [name]  Procedure(s) Performed:  TOTAL HIP ARTHROPLASTY - DEPUY  Patient Location: PACU  Anesthesia Type: General and GA combined with regional for post-op pain  Level of Consciousness: awake, alert  and oriented  Airway and Oxygen Therapy: Patient Spontanous Breathing  Post-op Pain: mild  Post-op Assessment: Post-op Vital signs reviewed and Patient's Cardiovascular Status Stable  Post-op Vital Signs: stable  Complications: No apparent anesthesia complications

## 2011-05-01 NOTE — Transfer of Care (Signed)
Immediate Anesthesia Transfer of Care Note  Patient: [name]  Procedure(s) Performed:  TOTAL HIP ARTHROPLASTY - DEPUY  Patient Location: PACU  Anesthesia Type: General  Level of Consciousness: awake, alert , oriented and sedated  Airway & Oxygen Therapy: Patient Spontanous Breathing and Patient connected to face mask oxygen  Post-op Assessment: Post -op Vital signs reviewed and stable and Patient moving all extremities  Post vital signs: Reviewed and stable Filed Vitals:   05/01/11 0606  BP: 107/65  Pulse: 77  Temp: 37.3 C  Resp: 20    Complications: No apparent anesthesia complications

## 2011-05-02 ENCOUNTER — Encounter (HOSPITAL_COMMUNITY): Payer: Self-pay | Admitting: Anesthesiology

## 2011-05-02 ENCOUNTER — Encounter (HOSPITAL_COMMUNITY): Payer: Self-pay | Admitting: Orthopedic Surgery

## 2011-05-02 LAB — CBC
HCT: 29.2 % — ABNORMAL LOW (ref 39.0–52.0)
Hemoglobin: 9.7 g/dL — ABNORMAL LOW (ref 13.0–17.0)
MCH: 27.9 pg (ref 26.0–34.0)
MCV: 83.9 fL (ref 78.0–100.0)
RBC: 3.48 MIL/uL — ABNORMAL LOW (ref 4.22–5.81)

## 2011-05-02 LAB — BASIC METABOLIC PANEL
CO2: 25 mEq/L (ref 19–32)
Calcium: 8.4 mg/dL (ref 8.4–10.5)
Chloride: 99 mEq/L (ref 96–112)
Creatinine, Ser: 0.94 mg/dL (ref 0.50–1.35)
Glucose, Bld: 114 mg/dL — ABNORMAL HIGH (ref 70–99)

## 2011-05-02 MED ORDER — WARFARIN SODIUM 7.5 MG PO TABS
7.5000 mg | ORAL_TABLET | Freq: Once | ORAL | Status: AC
  Administered 2011-05-02: 7.5 mg via ORAL
  Filled 2011-05-02: qty 1

## 2011-05-02 NOTE — Addendum Note (Signed)
Addendum  created 05/02/11 1247 by Alvan Dame, CRNA   Modules edited:Anesthesia Medication Administration

## 2011-05-02 NOTE — Progress Notes (Signed)
Physical Therapy Treatment Patient Details Name: [name] MRN: [id number] DOB: Jan 03, 1956 Today's Date: 05/02/2011  PT Assessment/Plan  PT - Assessment/Plan Comments on Treatment Session: On track for dc home tomorrow from PT standpoint PT Plan: Discharge plan remains appropriate PT Frequency: 7X/week Follow Up Recommendations: Home health PT Equipment Recommended: None recommended by PT PT Goals  Acute Rehab PT Goals Time For Goal Achievement: 7 days Pt will go Supine/Side to Sit: with modified independence PT Goal: Supine/Side to Sit - Progress: Progressing toward goal Pt will go Sit to Supine/Side: with modified independence PT Goal: Sit to Supine/Side - Progress: Progressing toward goal Pt will go Sit to Stand: with modified independence PT Goal: Sit to Stand - Progress: Progressing toward goal Pt will go Stand to Sit: with modified independence PT Goal: Stand to Sit - Progress: Progressing toward goal Pt will Ambulate: >150 feet;with modified independence;with least restrictive assistive device PT Goal: Ambulate - Progress: Progressing toward goal Pt will Go Up / Down Stairs: 3-5 stairs;with min assist;with rolling walker PT Goal: Up/Down Stairs - Progress: Other (comment) Pt will Perform Home Exercise Program: Independently PT Goal: Perform Home Exercise Program - Progress: Other (comment)  PT Treatment Precautions/Restrictions  Precautions Precautions: Posterior Hip Precaution Comments: gave pt handout Required Braces or Orthoses: No Restrictions Weight Bearing Restrictions: Yes LLE Weight Bearing: Weight bearing as tolerated Mobility (including Balance) Bed Mobility Bed Mobility: Yes Supine to Sit: 4: Min assist (without physical contact) Supine to Sit Details (indicate cue type and reason): used blanket roll to correctly help LLE clear EOB Sitting - Scoot to Edge of Bed: 5: Supervision Sitting - Scoot to [place] of Bed Details (indicate cue type and reason):  cues for safety Sit to Supine: 4: Min assist Sit to Supine - Details (indicate cue type and reason): used blanket roll to correctly assist LLE into bed Transfers Sit to Stand: 4: Min assist;From chair/3-in-1;With upper extremity assist (without physical contact) Sit to Stand Details (indicate cue type and reason): cues to preposition for post hip prec Stand to Sit: 4: Min assist;With upper extremity assist;With armrests;To bed;To chair/3-in-1 Stand to Sit Details: cues to preposition for post hip prec Ambulation/Gait Ambulation/Gait Assistance: 4: Min assist (without physical contact) Ambulation/Gait Assistance Details (indicate cue type and reason): excellent performance; cues for post hip prec during turns Ambulation Distance (Feet): 160 Feet Assistive device: Rolling walker Gait Pattern: Step-through pattern Stairs:  (plan for stair training tomorrow with wife)      End of Session PT - End of Session Activity Tolerance: Patient tolerated treatment well Patient left: in chair;with call bell in reach;with family/visitor present (dinner delivered) General Behavior During Session: [facility] for tasks performed Cognition: [facility] for tasks performed  Van Clines [facility] [place], [place] [phone]  05/02/2011, 4:58 PM

## 2011-05-02 NOTE — Progress Notes (Addendum)
ANTICOAGULATION CONSULT NOTE -follow up  Pharmacy Consult for coumadin Indication: VTE prophylaxis  Assessment: 77 YOM s/p  L Hip THA (POD#1).  INR 1.18 after first dose of 7.5mg  Note- patient is on celebrex and can increase risk of bleeding. No bleeding reported.   MD note says lovenox/coumadin bridge but no lovenox ordered.    Goal of Therapy:  INR 1.5 - 2 per Dr. Turner Daniels   Plan:  1. Repeat Coumadin 7.5mg  PO x 1 tonight 2. Daily INR 3. ? lovenox?  Vital Signs: Temp: 97.6 F (36.4 C) (01/15 0523) BP: 112/54 mmHg (01/15 0523) Pulse Rate: 90  (01/15 0523)  Labs:  Basename 05/02/11 0624  HGB 9.7*  HCT 29.2*  PLT 232  APTT --  LABPROT 15.3*  INR 1.18  HEPARINUNFRC --  CREATININE 0.94  CKTOTAL --  CKMB --  TROPONINI --   Estimated Creatinine Clearance: 121.3 ml/min (by C-G formula based on Cr of 0.94).  Len Childs T 05/02/2011,2:16 PM   Discussed w/ Dr. Turner Daniels. No lovenox bridge desired b/c pt bleed 1100 mls in OR. Herby Abraham

## 2011-05-02 NOTE — Progress Notes (Signed)
Patient ID: [name], male   DOB: 1955-12-12, 56 y.o.   MRN: [id number] PATIENT ID: [name]  MRN: [id number]  DOB/AGE:  1955-10-22 / 56 y.o.  1 Day Post-Op Procedure(s) (LRB): TOTAL HIP ARTHROPLASTY (Left)    PROGRESS NOTE Subjective: Patient is alert, oriented,noNausea, no Vomiting, yes passing gas, no Bowel Movement. Taking PO well. Denies SOB, Chest or Calf Pain. Using Incentive Spirometer, PAS in place. Ambulate WBAT Patient reports pain as 5 on 0-10 scale  .    Objective: Vital signs in last 24 hours: Filed Vitals:   05/01/11 1500 05/01/11 2148 05/02/11 0155 05/02/11 0523  BP: 125/73 99/60 107/61 112/54  Pulse: 96 79 79 90  Temp: 98.2 F (36.8 C) 98.2 F (36.8 C) 97.9 F (36.6 C) 97.6 F (36.4 C)  TempSrc:      Resp: 18 18 18 18   Height:      Weight:      SpO2: 98% 99% 97% 97%      Intake/Output from previous day: I/O last 3 completed shifts: In: 6395 [P.O.:770; I.V.:5125; IV Piggyback:500] Out: 6100 [Urine:5000; Blood:1100]   Intake/Output this shift:     LABORATORY DATA:  Basename 05/02/11 0624  WBC 12.4*  HGB 9.7*  HCT 29.2*  PLT 232  NA 134*  K 4.1  CL 99  CO2 25  BUN 16  CREATININE 0.94  GLUCOSE 114*  GLUCAP --  INR 1.18  CALCIUM 8.4    Examination: Neurologically intact ABD soft Neurovascular intact Sensation intact distally Intact pulses distally Dorsiflexion/Plantar flexion intact Incision: no drainage and scant drainage No cellulitis present Compartment soft} XR AP&Lat of hip shows well placed\fixed THA   Assessment:   1 Day Post-Op Procedure(s) (LRB): TOTAL HIP ARTHROPLASTY (Left) ADDITIONAL DIAGNOSIS:  none  Plan: PT/OT WBAT, THA  posterior precautions  DVT Prophylaxis: Lovenox\Coumadin bridge, monitor INR 1.5-2.0 target  DISCHARGE PLAN: Home  DISCHARGE NEEDS: HHPT, HHRN, CPM, Walker and 3-in-1 comode seat

## 2011-05-02 NOTE — Progress Notes (Signed)
OT consult received, chart reviewed.  Spoke with pt. Who does not wish to have OT services.  Pt. States he remembers how to do ADLs, shower transfers, and toilet transfers from last hip ('95).  He reports his plan is to have wife help with LB dsg - he is not interested in AE, he has a tub shower bench, and elevated commode at home.   Spoke with PT who reports pt. Did very well today, and demonstrates good compliance with THR precautions.  Will sign off.  PT will alert [place] if any concerns arise, and we will re-initiate OT if warranted.  Thanks!  Jeani Hawking, OTR/L [phone]

## 2011-05-02 NOTE — Progress Notes (Signed)
Physical Therapy Evaluation Patient Details Name: [name] MRN: [id number] DOB: 02-16-1956 Today's Date: 05/02/2011  Problem List:  Patient Active Problem List  Diagnoses  . Hip arthritis Left    Past Medical History:  Past Medical History  Diagnosis Date  . Hypertension   . Sleep apnea   . Hyperlipemia    Past Surgical History:  Past Surgical History  Procedure Date  . Joint replacement     bilateral knee replacements  . Hip surgery     rt hip replacement  . Colonoscopy     PT Assessment/Plan/Recommendation PT Assessment PT Recommendation/Assessment: Patient will need skilled PT in the acute care venue PT Problem List: Decreased strength;Decreased range of motion;Decreased mobility;Decreased knowledge of use of DME;Decreased knowledge of precautions;Pain PT Therapy Diagnosis : Difficulty walking;Acute pain PT Plan PT Frequency: 7X/week PT Treatment/Interventions: DME instruction;Gait training;Stair training;Functional mobility training;Therapeutic activities;Therapeutic exercise;Patient/family education PT Recommendation Follow Up Recommendations: Home health PT Equipment Recommended: None recommended by PT PT Goals  Acute Rehab PT Goals PT Goal Formulation: With patient Time For Goal Achievement: 7 days Pt will go Supine/Side to Sit: with modified independence Pt will go Sit to Supine/Side: with modified independence Pt will go Sit to Stand: with modified independence Pt will go Stand to Sit: with modified independence Pt will Ambulate: >150 feet;with modified independence;with least restrictive assistive device Pt will Go Up / Down Stairs: 3-5 stairs;with min assist;with rolling walker Pt will Perform Home Exercise Program: Independently (and adhere to post hip precautions)  PT Evaluation Precautions/Restrictions  Precautions Precautions: Posterior Hip Precaution Comments: gave pt handout Required Braces or Orthoses: No Restrictions Weight Bearing  Restrictions: Yes LLE Weight Bearing: Weight bearing as tolerated Prior Functioning  Home Living Lives With: Spouse Receives Help From: Family Type of Home: House Home Layout: One level Home Access: Stairs to enter Entrance Stairs-Rails: None Entrance Stairs-Number of Steps: 3 Bathroom Shower/Tub: Fish farm manager Equipment: Bedside commode/3-in-1;Crutches;Walker - rolling Prior Function Level of Independence: Independent with homemaking with ambulation Able to Take Stairs?: Yes Driving: Yes Vocation: Full time employment Leisure: Hobbies-yes (Comment) Cognition Cognition Arousal/Alertness: Awake/alert Overall Cognitive Status: Appears within functional limits for tasks assessed Orientation Level: Oriented X4 Sensation/Coordination Sensation Light Touch: Appears Intact Coordination Gross Motor Movements are Fluid and Coordinated: Yes Fine Motor Movements are Fluid and Coordinated: Yes Extremity Assessment RUE Assessment RUE Assessment: Within Functional Limits LUE Assessment LUE Assessment: Within Functional Limits RLE Assessment RLE Assessment: Within Functional Limits LLE Assessment LLE Assessment: Exceptions to [facility] LLE Strength LLE Overall Strength Comments: grossly decr AROM and strength, limited by pain postop Mobility (including Balance) Bed Mobility Bed Mobility: Yes Supine to Sit: 4: Min assist (wothout physical contact) Sitting - Scoot to Edge of Bed: 5: Supervision Transfers Transfers: Yes Sit to Stand: 4: Min assist;From bed;With upper extremity assist Stand to Sit: 4: Min assist;With armrests;To chair/3-in-1 Ambulation/Gait Ambulation/Gait: Yes Ambulation/Gait Assistance: 4: Min assist (with and without physical contact) Assistive device: Rolling walker Gait Pattern: Step-through pattern    Exercise  Total Joint Exercises Ankle Circles/Pumps: AROM;Both;20 reps;Supine Quad Sets: AROM;Left;10 reps;Supine Gluteal Sets: AROM;Both;10  reps;Supine Towel Squeeze: AROM;Left;10 reps;Supine (mointored for internal rotation) Heel Slides: AAROM;Left;10 reps;Supine Hip ABduction/ADduction: AAROM;Left;10 reps;Supine End of Session PT - End of Session Equipment Utilized During Treatment: Gait belt Activity Tolerance: Patient tolerated treatment well Patient left: in chair;with call bell in reach Nurse Communication: Mobility status for transfers;Mobility status for ambulation General Behavior During Session: [facility] for tasks performed Cognition: [facility] for tasks performed  Van Clines [place], [place] [phone]  05/02/2011, 12:26 PM

## 2011-05-03 LAB — CBC
HCT: 28.6 % — ABNORMAL LOW (ref 39.0–52.0)
Hemoglobin: 9.5 g/dL — ABNORMAL LOW (ref 13.0–17.0)
MCH: 28.3 pg (ref 26.0–34.0)
MCHC: 33.2 g/dL (ref 30.0–36.0)
RBC: 3.36 MIL/uL — ABNORMAL LOW (ref 4.22–5.81)

## 2011-05-03 MED ORDER — OXYCODONE-ACETAMINOPHEN 5-325 MG PO TABS: 1.0000 | ORAL_TABLET | ORAL | Status: AC | PRN

## 2011-05-03 MED ORDER — WARFARIN SODIUM 5 MG PO TABS: ORAL_TABLET | ORAL | Status: DC

## 2011-05-03 MED ORDER — METHOCARBAMOL 500 MG PO TABS: 500.0000 mg | ORAL_TABLET | Freq: Four times a day (QID) | ORAL | Status: AC | PRN

## 2011-05-03 NOTE — Progress Notes (Signed)
CARE MANAGEMENT NOTE 05/03/2011  Discharge planning. Patient was preoperatively setup with [facility].No changes.  Patient has rolling walker and 3in1.

## 2011-05-03 NOTE — Discharge Summary (Signed)
Patient ID: [name] MRN: [id number] DOB/AGE: April 27, 1955 56 y.o.  Admit date: 05/01/2011 Discharge date: 05/03/2011  Admission Diagnoses:  Principal Problem:  *Hip arthritis Left   Discharge Diagnoses:  Same  Past Medical History  Diagnosis Date  . Hypertension   . Sleep apnea   . Hyperlipemia     Surgeries: Procedure(s): TOTAL HIP ARTHROPLASTY on 05/01/2011   Consultants:    Discharged Condition: Improved  Hospital Course: BRASON BERTHELOT is an 56 y.o. male who was admitted 05/01/2011 for operative treatment ofHip arthritis. Patient has severe unremitting pain that affects sleep, daily activities, and work/hobbies. After pre-op clearance the patient was taken to the operating room on 05/01/2011 and underwent  Procedure(s): TOTAL HIP ARTHROPLASTY.    Patient was given perioperative antibiotics: Anti-infectives     Start     Dose/Rate Route Frequency Ordered Stop   05/01/11 0745   ceFAZolin (ANCEF) IVPB 2 g/50 mL premix        2 g 100 mL/hr over 30 Minutes Intravenous To Surgery 05/01/11 0726 05/01/11 0741           Patient was given sequential compression devices, early ambulation, and chemoprophylaxis to prevent DVT.  Patient benefited maximally from hospital stay and there were no complications.    Recent vital signs: Patient Vitals for the past 24 hrs:  BP Temp Pulse Resp SpO2  05/03/11 0508 88/56 mmHg 97.8 F (36.6 C) 95  18  95 %  30-May-2011 2124 99/63 mmHg 97.9 F (36.6 C) 94  18  96 %  May 30, 2011 1531 98/56 mmHg 98.6 F (37 C) 85  20  96 %     Recent laboratory studies:  Basename 05/03/11 0530 30-May-2011 0624  WBC 11.2* 12.4*  HGB 9.5* 9.7*  HCT 28.6* 29.2*  PLT 214 232  NA -- 134*  K -- 4.1  CL -- 99  CO2 -- 25  BUN -- 16  CREATININE -- 0.94  GLUCOSE -- 114*  INR 1.26 1.18  CALCIUM -- 8.4     Discharge Medications:  Current Discharge Medication List    START taking these medications   Details  methocarbamol (ROBAXIN) 500 MG tablet  Take 1 tablet (500 mg total) by mouth every 6 (six) hours as needed. Qty: 60 tablet, Refills: 0    !! oxyCODONE-acetaminophen (ROXICET) 5-325 MG per tablet Take 1 tablet by mouth every 4 (four) hours as needed for pain. Qty: 60 tablet, Refills: 0    warfarin (COUMADIN) 5 MG tablet Take as directed by HHN. INR target 1.5-2.5. D/C after 2 weeks from date of surgery Qty: 30 tablet, Refills: 0     !! - Potential duplicate medications found. Please discuss with provider.    CONTINUE these medications which have NOT CHANGED   Details  atorvastatin (LIPITOR) 40 MG tablet Take 40 mg by mouth daily.      brimonidine (ALPHAGAN) 0.2 % ophthalmic solution Place 1 drop into the right eye 2 (two) times daily.      celecoxib (CELEBREX) 200 MG capsule Take 400 mg by mouth daily.      Cyanocobalamin (VITAMIN B-12) 2000 MCG TBCR Take 1 tablet by mouth daily.      lisinopril-hydrochlorothiazide (PRINZIDE,ZESTORETIC) 20-12.5 MG per tablet Take 2 tablets by mouth daily.      Multiple Vitamins-Minerals (MULTIVITAMINS THER. W/MINERALS) TABS Take 1 tablet by mouth daily.      Olopatadine HCl (PATADAY) 0.2 % SOLN Place 1 drop into both eyes daily as needed. For allergies.     !!  oxyCODONE-acetaminophen (PERCOCET) 5-325 MG per tablet Take 1 tablet by mouth 3 (three) times daily as needed. For pain.      !! - Potential duplicate medications found. Please discuss with provider.    STOP taking these medications     aspirin 325 MG tablet         Diagnostic Studies: Dg Chest 2 View  04/21/2011  *RADIOLOGY REPORT*  Clinical Data: Preop  CHEST - 2 VIEW  Comparison: 04/07/2008  Findings: Cardiomediastinal silhouette is stable.  No acute infiltrate or pleural effusion.  No pulmonary edema.  Mild degenerative changes thoracic spine. Partially visualized metallic fixation plate cervical spine.  IMPRESSION: No active disease.  Mild degenerative changes thoracic spine.  Original Report Authenticated By: Natasha Mead,  M.D.   Dg Pelvis Portable  05/01/2011  *RADIOLOGY REPORT*  Clinical Data: Left total hip arthroplasty.  PORTABLE PELVIS  Comparison: None.  Findings: There are bilateral hip arthroplasties.  Subcutaneous air is seen about the left hip.  Obturator rings are intact.  IMPRESSION: Bilateral total hip arthroplasties with expected immediate postoperative findings on the left.  Original Report Authenticated By: Reyes Ivan, M.D.    Disposition:   Discharge Orders    Future Orders Please Complete By Expires   Diet - low sodium heart healthy      Call MD / Call 911      Comments:   If you experience chest pain or shortness of breath, CALL 911 and be transported to the hospital emergency room.  If you develope a fever above 101 F, pus (white drainage) or increased drainage or redness at the wound, or calf pain, call your surgeon's office.   Constipation Prevention      Comments:   Drink plenty of fluids.  Prune juice may be helpful.  You may use a stool softener, such as Colace (over the counter) 100 mg twice a day.  Use MiraLax (over the counter) for constipation as needed.   Increase activity slowly as tolerated      Weight Bearing as taught in Physical Therapy      Comments:   Use a walker or crutches as instructed.   Patient may shower      Comments:   You may shower without a dressing once there is no drainage.  Do not wash over the wound.  If drainage remains, cover wound with plastic wrap and then shower.   Driving restrictions      Comments:   No driving for 2 weeks   Follow the hip precautions as taught in Physical Therapy      Change dressing      Comments:   You may change your dressing on POD #5      Follow-up Information    Follow up with [facility]   Contact information:   [facility] [address] [place] [address] [phone]           Signed: Nestor Lewandowsky 05/03/2011, 6:25 AM

## 2011-05-03 NOTE — Progress Notes (Signed)
ANTICOAGULATION CONSULT NOTE -follow up  Pharmacy Consult for coumadin Indication: VTE prophylaxis  Assessment: 70 YOM s/p  L Hip THA (POD#2).  INR 1.26 after 2 doses of 7.5mg  Note- patient is on celebrex and can increase risk of bleeding. No bleeding reported.   No enoxaparin bridge per Dr. Turner Daniels.     Goal of Therapy:  INR 1.5 - 2.5 per Dr. Turner Daniels   Plan:  1. Patient discharging home today, with [facility] 2. [facility] will monitor INR and adjust warfarin following discharge 3.  Patient instructed to take 7.5 mg warfarin po x1 tonight, then [facility] will dose, recommend INR check  tomorrow  4.  Warfarin education provided, patient stated understanding, stated no questions/concerns, and confirmed warfarin plan.     Vital Signs: Temp: 97.8 F (36.6 C) (01/16 0508) BP: 88/56 mmHg (01/16 0508) Pulse Rate: 95  (01/16 0508)  Labs:  Basename 05/03/11 0530 05/02/11 0624  HGB 9.5* 9.7*  HCT 28.6* 29.2*  PLT 214 232  APTT -- --  LABPROT 16.1* 15.3*  INR 1.26 1.18  HEPARINUNFRC -- --  CREATININE -- 0.94  CKTOTAL -- --  CKMB -- --  TROPONINI -- --   Estimated Creatinine Clearance: 121.3 ml/min (by C-G formula based on Cr of 0.94).  Ismahan Lippman, Maryagnes Amos 05/03/2011,9:48 AM

## 2011-05-03 NOTE — Progress Notes (Signed)
Physical Therapy Treatment Patient Details Name: [name] MRN: [id number] DOB: 1955/05/12 Today's Date: 05/03/2011  PT Assessment/Plan  PT - Assessment/Plan Comments on Treatment Session: PT cleared pt for dc home PT Plan: Discharge plan remains appropriate PT Frequency: 7X/week Follow Up Recommendations: Home health PT Equipment Recommended: None recommended by PT PT Goals  Acute Rehab PT Goals Pt will go Supine/Side to Sit: with modified independence Pt will go Sit to Supine/Side: with modified independence Pt will go Sit to Stand: with modified independence Pt will go Stand to Sit: with modified independence Pt will Ambulate: >150 feet;with modified independence;with least restrictive assistive device Pt will Go Up / Down Stairs: 3-5 stairs;with min assist;with rolling walker Pt will Perform Home Exercise Program: Independently  PT Treatment Precautions/Restrictions  Precautions Precautions: Posterior Hip Precaution Comments: gave pt handout Required Braces or Orthoses: No Restrictions Weight Bearing Restrictions: Yes LLE Weight Bearing: Weight bearing as tolerated Mobility (including Balance) Transfers Sit to Stand: 5: Supervision;With armrests;From chair/3-in-1 Ambulation/Gait Ambulation/Gait Assistance: 4: Min assist (without physical contact) Assistive device: Rolling walker Gait Pattern: Step-through pattern Stairs: Yes Stairs Assistance: 4: Min assist Stair Management Technique: With walker;Backwards       End of Session PT - End of Session Equipment Utilized During Treatment: Gait belt Activity Tolerance: Patient tolerated treatment well Patient left: with family/visitor present (ambulating in hallway with wife assist) General Behavior During Session: [facility] for tasks performed Cognition: [facility] for tasks performed  Van Clines [facility] [place], [place] [phone]  05/03/2011, 3:18 PM

## 2011-06-10 ENCOUNTER — Emergency Department (HOSPITAL_BASED_OUTPATIENT_CLINIC_OR_DEPARTMENT_OTHER)
Admission: EM | Admit: 2011-06-10 | Discharge: 2011-06-10 | Disposition: A | Discharge status: Home / Self Care | Payer: BC Managed Care – PPO | Attending: Emergency Medicine | Admitting: Emergency Medicine

## 2011-06-10 ENCOUNTER — Encounter (HOSPITAL_BASED_OUTPATIENT_CLINIC_OR_DEPARTMENT_OTHER): Payer: Self-pay

## 2011-06-10 ENCOUNTER — Emergency Department (INDEPENDENT_AMBULATORY_CARE_PROVIDER_SITE_OTHER): Payer: BC Managed Care – PPO

## 2011-06-10 ENCOUNTER — Other Ambulatory Visit: Payer: Self-pay

## 2011-06-10 DIAGNOSIS — E785 Hyperlipidemia, unspecified: Secondary | ICD-10-CM | POA: Insufficient documentation

## 2011-06-10 DIAGNOSIS — N3090 Cystitis, unspecified without hematuria: Secondary | ICD-10-CM

## 2011-06-10 DIAGNOSIS — N390 Urinary tract infection, site not specified: Secondary | ICD-10-CM | POA: Insufficient documentation

## 2011-06-10 DIAGNOSIS — I10 Essential (primary) hypertension: Secondary | ICD-10-CM | POA: Insufficient documentation

## 2011-06-10 DIAGNOSIS — Z79899 Other long term (current) drug therapy: Secondary | ICD-10-CM | POA: Insufficient documentation

## 2011-06-10 DIAGNOSIS — R112 Nausea with vomiting, unspecified: Secondary | ICD-10-CM | POA: Insufficient documentation

## 2011-06-10 DIAGNOSIS — R5381 Other malaise: Secondary | ICD-10-CM | POA: Insufficient documentation

## 2011-06-10 DIAGNOSIS — R5383 Other fatigue: Secondary | ICD-10-CM | POA: Insufficient documentation

## 2011-06-10 DIAGNOSIS — G4730 Sleep apnea, unspecified: Secondary | ICD-10-CM | POA: Insufficient documentation

## 2011-06-10 LAB — CBC
HCT: 35.7 % — ABNORMAL LOW (ref 39.0–52.0)
MCHC: 32.5 g/dL (ref 30.0–36.0)
Platelets: 201 10*3/uL (ref 150–400)
RDW: 14.2 % (ref 11.5–15.5)
WBC: 12.4 10*3/uL — ABNORMAL HIGH (ref 4.0–10.5)

## 2011-06-10 LAB — URINALYSIS, ROUTINE W REFLEX MICROSCOPIC
Glucose, UA: NEGATIVE mg/dL
Ketones, ur: NEGATIVE mg/dL
Nitrite: NEGATIVE
Protein, ur: NEGATIVE mg/dL

## 2011-06-10 LAB — COMPREHENSIVE METABOLIC PANEL
ALT: 13 U/L (ref 0–53)
AST: 14 U/L (ref 0–37)
Albumin: 3.6 g/dL (ref 3.5–5.2)
Alkaline Phosphatase: 74 U/L (ref 39–117)
CO2: 27 mEq/L (ref 19–32)
Chloride: 100 mEq/L (ref 96–112)
Creatinine, Ser: 1 mg/dL (ref 0.50–1.35)
GFR calc non Af Amer: 82 mL/min — ABNORMAL LOW (ref >90)
Potassium: 3.5 mEq/L (ref 3.5–5.1)
Sodium: 138 mEq/L (ref 135–145)
Total Bilirubin: 0.4 mg/dL (ref 0.3–1.2)

## 2011-06-10 LAB — DIFFERENTIAL
Basophils Absolute: 0 10*3/uL (ref 0.0–0.1)
Basophils Relative: 0 % (ref 0–1)
Lymphocytes Relative: 6 % — ABNORMAL LOW (ref 12–46)
Neutro Abs: 10.9 10*3/uL — ABNORMAL HIGH (ref 1.7–7.7)
Neutrophils Relative %: 88 % — ABNORMAL HIGH (ref 43–77)

## 2011-06-10 LAB — URINE MICROSCOPIC-ADD ON

## 2011-06-10 MED ORDER — DEXTROSE 5 % IV SOLN
1.0000 g | Freq: Once | INTRAVENOUS | Status: AC
  Administered 2011-06-10: 1 g via INTRAVENOUS
  Filled 2011-06-10 (×2): qty 10

## 2011-06-10 MED ORDER — ACETAMINOPHEN 325 MG PO TABS
650.0000 mg | ORAL_TABLET | Freq: Once | ORAL | Status: AC
  Administered 2011-06-10: 650 mg via ORAL
  Filled 2011-06-10: qty 2

## 2011-06-10 MED ORDER — CIPROFLOXACIN HCL 500 MG PO TABS: 500.0000 mg | ORAL_TABLET | Freq: Two times a day (BID) | ORAL | Status: AC

## 2011-06-10 MED ORDER — SODIUM CHLORIDE 0.9 % IV BOLUS (SEPSIS)
1000.0000 mL | Freq: Once | INTRAVENOUS | Status: AC
  Administered 2011-06-10: 1000 mL via INTRAVENOUS

## 2011-06-10 NOTE — ED Notes (Addendum)
Pt states that he was out of town on Thursday had fever, woke up Friday morning and went to a doctor, then came home sent to see PCP yesterday and was dx with UTI, fever, leukocytosis, and influenza A.  Pt presents today with recommendation from PCP to have blood cultures drawn, and IV abx until his wbc count is declining.  Labs show WBC of 24.3.  Has been given rocephin at PCP on Friday, taking cipro PO, tamiflu bid, and phenergan for nausea.

## 2011-06-10 NOTE — Discharge Instructions (Signed)

## 2011-06-10 NOTE — ED Provider Notes (Addendum)
History     CSN: [id number]  Arrival date & time 06/10/11  1232   First MD Initiated Contact with Patient 06/10/11 1250      Chief Complaint  Patient presents with  . Blood Infection    (Consider location/radiation/quality/duration/timing/severity/associated sxs/prior treatment) HPI Comments: Patient presents with 2 days of fevers and generalized fatigue.  Patient states that he was out of town on Thursday and started feeling subjective fevers and some mild nausea with some emesis.  He was seen in the urgent care at that time and was ultimately diagnosed with a UTI and given a shot of Rocephin and placed on ciprofloxacin.  He also did an influenza test which was positive and put him on Tamiflu.  Patient denies any cough or myalgias.  He states he has not received a chest x-ray.  Patient followed up with his primary care physician group yesterday and they reevaluated him and did repeat CBCs which showed continued elevated white blood cell count and they gave him another shot of Rocephin and advised him to followup in the ER.  Patient at this point in time has had some intermittent nausea but no abnormal pain.  He did have a bowel movement today which she states was normal.  He denies any abdominal pain.  He has no cough or shortness of breath, or chest pain.  Patient does note that he's had to urinate more frequently although he does not have any dysuria.  Patient is a 56 y.o. male presenting with fever. The history is provided by the patient. No language interpreter was used.  Fever Primary symptoms of the febrile illness include fever, fatigue, nausea and vomiting. Primary symptoms do not include visual change, headaches, cough, wheezing, shortness of breath, abdominal pain, diarrhea, dysuria, altered mental status, myalgias, arthralgias or rash. The current episode started 2 days ago. This is a new problem. The problem has not changed since onset.   Past Medical History  Diagnosis Date  .  Hypertension   . Sleep apnea   . Hyperlipemia     Past Surgical History  Procedure Date  . Joint replacement     bilateral knee replacements  . Hip surgery     rt hip replacement  . Colonoscopy   . Total hip arthroplasty 05/01/2011    Procedure: TOTAL HIP ARTHROPLASTY;  Surgeon: Nestor Lewandowsky;  Location: [facility] OR;  Service: Orthopedics;  Laterality: Left;  DEPUY  . Knee surgery     History reviewed. No pertinent family history.  History  Substance Use Topics  . Smoking status: Never Smoker   . Smokeless tobacco: Not on file  . Alcohol Use: 0.6 oz/week    1 Glasses of wine per week      Review of Systems  Constitutional: Positive for fever and fatigue. Negative for chills.  HENT: Negative.   Eyes: Negative.  Negative for discharge and redness.  Respiratory: Negative.  Negative for cough, shortness of breath and wheezing.   Cardiovascular: Negative.  Negative for chest pain.  Gastrointestinal: Positive for nausea and vomiting. Negative for abdominal pain and diarrhea.  Genitourinary: Positive for frequency. Negative for dysuria and hematuria.  Musculoskeletal: Negative.  Negative for myalgias, back pain and arthralgias.  Skin: Negative.  Negative for color change and rash.  Neurological: Negative for syncope and headaches.  Hematological: Negative.  Negative for adenopathy.  Psychiatric/Behavioral: Negative.  Negative for confusion and altered mental status.  All other systems reviewed and are negative.    Allergies  Review  of patient's allergies indicates no known allergies.  Home Medications   Current Outpatient Rx  Name Route Sig Dispense Refill  . ASPIRIN 325 MG PO TABS Oral Take 325 mg by mouth daily.    . ATORVASTATIN CALCIUM 40 MG PO TABS Oral Take 40 mg by mouth daily.      Marland Kitchen BRIMONIDINE TARTRATE 0.2 % OP SOLN Right Eye Place 1 drop into the right eye 2 (two) times daily.      . CELECOXIB 200 MG PO CAPS Oral Take 400 mg by mouth daily.      Marland Kitchen VITAMIN D-3 PO  Oral Take 2,000 Units by mouth daily.    Marland Kitchen CIPROFLOXACIN HCL 500 MG PO TABS Oral Take 500 mg by mouth 2 (two) times daily.    Marland Kitchen DIPHENHYDRAMINE-APAP (SLEEP) 25-500 MG PO TABS Oral Take 2 tablets by mouth at bedtime as needed.    Marland Kitchen HYDROCODONE-ACETAMINOPHEN 5-500 MG PO TABS Oral Take 1 tablet by mouth every 6 (six) hours as needed.    Carma Leaven M PLUS PO TABS Oral Take 1 tablet by mouth daily.      . OSELTAMIVIR PHOSPHATE 75 MG PO CAPS Oral Take 75 mg by mouth 2 (two) times daily.    Marland Kitchen PROMETHAZINE HCL 25 MG PO TABS Oral Take 25 mg by mouth every 6 (six) hours as needed.    Marland Kitchen CIPROFLOXACIN HCL 500 MG PO TABS Oral Take 1 tablet (500 mg total) by mouth 2 (two) times daily. 14 tablet 0  . VITAMIN B-12 ER 2000 MCG PO TBCR Oral Take 1 tablet by mouth daily.      Marland Kitchen LISINOPRIL-HYDROCHLOROTHIAZIDE 20-12.5 MG PO TABS Oral Take 2 tablets by mouth daily.      . OLOPATADINE HCL 0.2 % OP SOLN Both Eyes Place 1 drop into both eyes daily as needed. For allergies.     . OXYCODONE-ACETAMINOPHEN 5-325 MG PO TABS Oral Take 1 tablet by mouth 3 (three) times daily as needed. For pain.     Marland Kitchen SILDENAFIL CITRATE 100 MG PO TABS Oral Take 100 mg by mouth daily as needed.    . WARFARIN SODIUM 5 MG PO TABS  Take as directed by HHN. INR target 1.5-2.5. D/C after 2 weeks from date of surgery 30 tablet 0    BP 112/70  Pulse 89  Temp(Src) 98.8 F (37.1 C) (Oral)  Resp 16  Ht 5\' 11"  (1.803 m)  Wt 297 lb (134.718 kg)  BMI 41.42 kg/m2  SpO2 98%  Physical Exam  Nursing note and vitals reviewed. Constitutional: He is oriented to person, place, and time. He appears well-developed and well-nourished.  Non-toxic appearance. He does not have a sickly appearance.  HENT:  Head: Normocephalic and atraumatic.  Eyes: Conjunctivae, EOM and lids are normal. Pupils are equal, round, and reactive to light.  Neck: Trachea normal, normal range of motion and full passive range of motion without pain. Neck supple.  Cardiovascular: Normal  rate, regular rhythm and normal heart sounds.   Pulmonary/Chest: Effort normal and breath sounds normal. No respiratory distress. He has no wheezes. He has no rales. He exhibits no tenderness.  Abdominal: Soft. Normal appearance. He exhibits no distension. There is no tenderness. There is no rebound and no CVA tenderness.  Musculoskeletal: Normal range of motion.  Neurological: He is alert and oriented to person, place, and time. He has normal strength.  Skin: Skin is warm, dry and intact. No rash noted.  Psychiatric: He has a normal mood and  affect. His behavior is normal. Judgment and thought content normal.    ED Course  Procedures (including critical care time)  Results for orders placed during the hospital encounter of 06/10/11  URINALYSIS, ROUTINE W REFLEX MICROSCOPIC      Component Value Range   Color, Urine YELLOW  YELLOW    APPearance CLEAR  CLEAR    Specific Gravity, Urine 1.010  1.005 - 1.030    pH 7.0  5.0 - 8.0    Glucose, UA NEGATIVE  NEGATIVE (mg/dL)   Hgb urine dipstick NEGATIVE  NEGATIVE    Bilirubin Urine NEGATIVE  NEGATIVE    Ketones, ur NEGATIVE  NEGATIVE (mg/dL)   Protein, ur NEGATIVE  NEGATIVE (mg/dL)   Urobilinogen, UA 0.2  0.0 - 1.0 (mg/dL)   Nitrite NEGATIVE  NEGATIVE    Leukocytes, UA TRACE (*) NEGATIVE   CBC      Component Value Range   WBC 12.4 (*) 4.0 - 10.5 (K/uL)   RBC 4.20 (*) 4.22 - 5.81 (MIL/uL)   Hemoglobin 11.6 (*) 13.0 - 17.0 (g/dL)   HCT 40.9 (*) 81.1 - 52.0 (%)   MCV 85.0  78.0 - 100.0 (fL)   MCH 27.6  26.0 - 34.0 (pg)   MCHC 32.5  30.0 - 36.0 (g/dL)   RDW 91.4  78.2 - 95.6 (%)   Platelets 201  150 - 400 (K/uL)  DIFFERENTIAL      Component Value Range   Neutrophils Relative 88 (*) 43 - 77 (%)   Neutro Abs 10.9 (*) 1.7 - 7.7 (K/uL)   Lymphocytes Relative 6 (*) 12 - 46 (%)   Lymphs Abs 0.7  0.7 - 4.0 (K/uL)   Monocytes Relative 6  3 - 12 (%)   Monocytes Absolute 0.7  0.1 - 1.0 (K/uL)   Eosinophils Relative 0  0 - 5 (%)   Eosinophils  Absolute 0.0  0.0 - 0.7 (K/uL)   Basophils Relative 0  0 - 1 (%)   Basophils Absolute 0.0  0.0 - 0.1 (K/uL)  COMPREHENSIVE METABOLIC PANEL      Component Value Range   Sodium 138  135 - 145 (mEq/L)   Potassium 3.5  3.5 - 5.1 (mEq/L)   Chloride 100  96 - 112 (mEq/L)   CO2 27  19 - 32 (mEq/L)   Glucose, Bld 122 (*) 70 - 99 (mg/dL)   BUN 10  6 - 23 (mg/dL)   Creatinine, Ser 2.13  0.50 - 1.35 (mg/dL)   Calcium 9.5  8.4 - 08.6 (mg/dL)   Total Protein 7.2  6.0 - 8.3 (g/dL)   Albumin 3.6  3.5 - 5.2 (g/dL)   AST 14  0 - 37 (U/L)   ALT 13  0 - 53 (U/L)   Alkaline Phosphatase 74  39 - 117 (U/L)   Total Bilirubin 0.4  0.3 - 1.2 (mg/dL)   GFR calc non Af Amer 82 (*) >90 (mL/min)   GFR calc Af Amer >90  >90 (mL/min)  LIPASE, BLOOD      Component Value Range   Lipase 35  11 - 59 (U/L)  URINE MICROSCOPIC-ADD ON      Component Value Range   Squamous Epithelial / LPF FEW (*) RARE    WBC, UA 21-50  <3 (WBC/hpf)   RBC / HPF 0-2  <3 (RBC/hpf)   Bacteria, UA FEW (*) RARE   TROPONIN I      Component Value Range   Troponin I <0.30  <0.30 (ng/mL)  Dg Abd Acute W/chest  06/10/2011  *RADIOLOGY REPORT*  Clinical Data: Bladder infection  ACUTE ABDOMEN SERIES (ABDOMEN 2 VIEW & CHEST 1 VIEW)  Comparison: None.  Findings: Low volumes and basilar atelectasis.  Normal heart size.  No disproportionally dilated loops of bowel.  Nonspecific colonic air fluid levels.  No free intraperitoneal gas.  Dextroscoliosis of the lumbar spine with degenerative changes.  Bilateral hip arthroplasty.  IMPRESSION: Nonobstructive bowel gas pattern.  Original Report Authenticated By: Donavan Burnet, M.D.      Date: 06/10/2011  Rate: 93  Rhythm: normal sinus rhythm  QRS Axis: normal  Intervals: normal  ST/T Wave abnormalities: normal  Conduction Disutrbances:none  Narrative Interpretation:   Old EKG Reviewed: unchanged from 04/21/2011    MDM  His Dr. at [place] physicians did advise him to come over for the possibility  of blood cultures and IV antibiotics although we do not seem to have a clear source for the patient's infection at this point in time.  Given the patient's urinary frequency he could have a prostatitis which could explain his symptoms.  Patient does not have symptoms that are consistent with influenza at this time.  I will obtain a chest x-ray to further rule out pneumonia as a source of this patient's fevers and leukocytosis.  He has no abdominal pain to suggest appendicitis, cholecystitis or other acute intra-abdominal process at this time.  I will obtain an abdominal x-ray to rule out obstruction given his nausea with mild constipation until today but this seems less likely.  I will also obtain an EKG and a troponin for an atypical presentation of ACS since this patient has no chest pain.        Nat Christen, MD 06/10/11 1320  Nat Christen, MD 06/10/11 1346   patient appears to have a urinary tract infection, likely prostatitis in this 56 year old male.  He shows no other signs of ACS, pneumonia or bowel obstruction.  Patient does have urinary frequency which correlates with a urinary tract infection.  Patient's leukocytosis is resolving and the patient is symptomatically improved here as well.  His heart rate is improved his blood pressure is normal I feel he is safe for discharge home with followup with his primary care physician later this week.  Nat Christen, MD 06/10/11 [phone]

## 2011-06-11 LAB — URINE CULTURE: Culture: NO GROWTH

## 2011-06-16 LAB — CULTURE, BLOOD (ROUTINE X 2)
Culture  Setup Time: 201302232140
Culture: NO GROWTH

## 2011-09-29 ENCOUNTER — Encounter: Payer: Self-pay | Admitting: Cardiology

## 2012-10-11 ENCOUNTER — Encounter (HOSPITAL_BASED_OUTPATIENT_CLINIC_OR_DEPARTMENT_OTHER): Payer: Self-pay | Admitting: *Deleted

## 2012-10-11 ENCOUNTER — Emergency Department (HOSPITAL_BASED_OUTPATIENT_CLINIC_OR_DEPARTMENT_OTHER)
Admission: EM | Admit: 2012-10-11 | Discharge: 2012-10-11 | Disposition: A | Discharge status: Home / Self Care | Payer: BC Managed Care – PPO | Attending: Emergency Medicine | Admitting: Emergency Medicine

## 2012-10-11 DIAGNOSIS — E785 Hyperlipidemia, unspecified: Secondary | ICD-10-CM | POA: Insufficient documentation

## 2012-10-11 DIAGNOSIS — Z79899 Other long term (current) drug therapy: Secondary | ICD-10-CM | POA: Insufficient documentation

## 2012-10-11 DIAGNOSIS — Y929 Unspecified place or not applicable: Secondary | ICD-10-CM | POA: Insufficient documentation

## 2012-10-11 DIAGNOSIS — S81009A Unspecified open wound, unspecified knee, initial encounter: Secondary | ICD-10-CM | POA: Insufficient documentation

## 2012-10-11 DIAGNOSIS — S81812A Laceration without foreign body, left lower leg, initial encounter: Secondary | ICD-10-CM

## 2012-10-11 DIAGNOSIS — Y9389 Activity, other specified: Secondary | ICD-10-CM | POA: Insufficient documentation

## 2012-10-11 DIAGNOSIS — Z7982 Long term (current) use of aspirin: Secondary | ICD-10-CM | POA: Insufficient documentation

## 2012-10-11 DIAGNOSIS — I10 Essential (primary) hypertension: Secondary | ICD-10-CM | POA: Insufficient documentation

## 2012-10-11 DIAGNOSIS — W268XXA Contact with other sharp object(s), not elsewhere classified, initial encounter: Secondary | ICD-10-CM | POA: Insufficient documentation

## 2012-10-11 NOTE — ED Notes (Signed)
Laceration left knee with weed eater

## 2012-10-11 NOTE — ED Provider Notes (Signed)
History    CSN: [id number] Arrival date & time 10/11/12  1203  First MD Initiated Contact with Patient 10/11/12 1212     Chief Complaint  Patient presents with  . Extremity Laceration   (Consider location/radiation/quality/duration/timing/severity/associated sxs/prior Treatment) HPI Comments: Pt states that he was trim his hedges and he cut his knee a couple of hours ago;pt states that he tried the nuskin but the area kept bleeding:pt denies any problems with movement  The history is provided by the patient. No language interpreter was used.   Past Medical History  Diagnosis Date  . Hypertension   . Sleep apnea   . Hyperlipemia    Past Surgical History  Procedure Laterality Date  . Joint replacement      bilateral knee replacements  . Hip surgery      rt hip replacement  . Colonoscopy    . Total hip arthroplasty  05/01/2011    Procedure: TOTAL HIP ARTHROPLASTY;  Surgeon: Nestor Lewandowsky;  Location: [facility] OR;  Service: Orthopedics;  Laterality: Left;  DEPUY  . Knee surgery     History reviewed. No pertinent family history. History  Substance Use Topics  . Smoking status: Never Smoker   . Smokeless tobacco: Not on file  . Alcohol Use: No    Review of Systems  Constitutional: Negative.   Respiratory: Negative.   Cardiovascular: Negative.     Allergies  Review of patient's allergies indicates no known allergies.  Home Medications   Current Outpatient Rx  Name  Route  Sig  Dispense  Refill  . aspirin 325 MG tablet   Oral   Take 325 mg by mouth daily.         Marland Kitchen atorvastatin (LIPITOR) 40 MG tablet   Oral   Take 40 mg by mouth daily.           . brimonidine (ALPHAGAN) 0.2 % ophthalmic solution   Right Eye   Place 1 drop into the right eye 2 (two) times daily.           . celecoxib (CELEBREX) 200 MG capsule   Oral   Take 400 mg by mouth daily.           . Cholecalciferol (VITAMIN D-3 PO)   Oral   Take 2,000 Units by mouth daily.         .  ciprofloxacin (CIPRO) 500 MG tablet   Oral   Take 500 mg by mouth 2 (two) times daily.         . Cyanocobalamin (VITAMIN B-12) 2000 MCG TBCR   Oral   Take 1 tablet by mouth daily.           . diphenhydramine-acetaminophen (TYLENOL PM) 25-500 MG TABS   Oral   Take 2 tablets by mouth at bedtime as needed.         Marland Kitchen HYDROcodone-acetaminophen (VICODIN) 5-500 MG per tablet   Oral   Take 1 tablet by mouth every 6 (six) hours as needed.         Marland Kitchen lisinopril-hydrochlorothiazide (PRINZIDE,ZESTORETIC) 20-12.5 MG per tablet   Oral   Take 2 tablets by mouth daily.           . Multiple Vitamins-Minerals (MULTIVITAMINS THER. W/MINERALS) TABS   Oral   Take 1 tablet by mouth daily.           . Olopatadine HCl (PATADAY) 0.2 % SOLN   Both Eyes   Place 1 drop into both eyes daily as  needed. For allergies.          Marland Kitchen oseltamivir (TAMIFLU) 75 MG capsule   Oral   Take 75 mg by mouth 2 (two) times daily.         Marland Kitchen oxyCODONE-acetaminophen (PERCOCET) 5-325 MG per tablet   Oral   Take 1 tablet by mouth 3 (three) times daily as needed. For pain.          . promethazine (PHENERGAN) 25 MG tablet   Oral   Take 25 mg by mouth every 6 (six) hours as needed.         . sildenafil (VIAGRA) 100 MG tablet   Oral   Take 100 mg by mouth daily as needed.         Marland Kitchen EXPIRED: warfarin (COUMADIN) 5 MG tablet      Take as directed by HHN. INR target 1.5-2.5. D/C after 2 weeks from date of surgery   30 tablet   0    BP 146/86  Pulse 91  Temp(Src) 97.8 F (36.6 C) (Oral)  Resp 18  Ht 6' (1.829 m)  Wt 283 lb (128.368 kg)  BMI 38.37 kg/m2  SpO2 98% Physical Exam  Nursing note and vitals reviewed. Constitutional: He is oriented to person, place, and time. He appears well-developed and well-nourished.  Cardiovascular: Normal rate and regular rhythm.   Pulmonary/Chest: Effort normal and breath sounds normal.  Musculoskeletal: Normal range of motion.  Neurological: He is alert and  oriented to person, place, and time.  Skin:  V shaped laceration to the left knee    ED Course  LACERATION REPAIR Date/Time: 10/11/2012 12:39 PM Performed by: Teressa Lower Authorized by: Teressa Lower Risks and benefits: risks, benefits and alternatives were discussed Consent given by: patient Body area: lower extremity Location details: left knee Laceration length: 5 cm Foreign bodies: no foreign bodies Anesthesia: local infiltration Local anesthetic: lidocaine 2% with epinephrine Irrigation solution: saline Irrigation method: tap Amount of cleaning: standard Skin closure: 3-0 Prolene Number of sutures: 5 Technique: simple Approximation: close Approximation difficulty: simple Patient tolerance: Patient tolerated the procedure well with no immediate complications.   (including critical care time) Labs Reviewed - No data to display No results found. 1. Leg laceration, left, initial encounter     MDM  Wound closed:tetanus is utd:pt instructed on follow up  Teressa Lower, NP 10/11/12 1241

## 2012-10-12 NOTE — ED Provider Notes (Signed)
Medical screening examination/treatment/procedure(s) were performed by non-physician practitioner and as supervising physician I was immediately available for consultation/collaboration.   Shelda Jakes, MD 10/12/12 [phone]

## 2014-05-01 ENCOUNTER — Other Ambulatory Visit: Payer: Self-pay | Admitting: Family Medicine

## 2014-05-01 DIAGNOSIS — N5089 Other specified disorders of the male genital organs: Secondary | ICD-10-CM

## 2014-05-05 ENCOUNTER — Ambulatory Visit
Admission: RE | Admit: 2014-05-05 | Discharge: 2014-05-05 | Disposition: A | Discharge status: Home / Self Care | Payer: BLUE CROSS/BLUE SHIELD | Source: Ambulatory Visit | Attending: Family Medicine | Admitting: Family Medicine

## 2014-05-05 DIAGNOSIS — N5089 Other specified disorders of the male genital organs: Secondary | ICD-10-CM

## 2014-05-05 MED ORDER — IOHEXOL 300 MG/ML  SOLN
125.0000 mL | Freq: Once | INTRAMUSCULAR | Status: AC | PRN
  Administered 2014-05-05: 125 mL via INTRAVENOUS

## 2014-07-28 ENCOUNTER — Other Ambulatory Visit (HOSPITAL_COMMUNITY): Payer: Self-pay | Admitting: Orthopedic Surgery

## 2014-07-28 DIAGNOSIS — M25551 Pain in right hip: Secondary | ICD-10-CM

## 2014-07-28 DIAGNOSIS — R1030 Lower abdominal pain, unspecified: Secondary | ICD-10-CM

## 2014-07-31 ENCOUNTER — Encounter (HOSPITAL_COMMUNITY)
Admission: RE | Admit: 2014-07-31 | Discharge: 2014-07-31 | Disposition: A | Discharge status: Home / Self Care | Payer: BLUE CROSS/BLUE SHIELD | Source: Ambulatory Visit | Attending: Orthopedic Surgery | Admitting: Orthopedic Surgery

## 2014-07-31 ENCOUNTER — Ambulatory Visit (HOSPITAL_COMMUNITY)
Admission: RE | Admit: 2014-07-31 | Discharge: 2014-07-31 | Disposition: A | Discharge status: Home / Self Care | Payer: BLUE CROSS/BLUE SHIELD | Source: Ambulatory Visit | Attending: Orthopedic Surgery | Admitting: Orthopedic Surgery

## 2014-07-31 DIAGNOSIS — M25551 Pain in right hip: Secondary | ICD-10-CM | POA: Insufficient documentation

## 2014-07-31 DIAGNOSIS — R1030 Lower abdominal pain, unspecified: Secondary | ICD-10-CM | POA: Diagnosis not present

## 2014-07-31 MED ORDER — TECHNETIUM TC 99M MEDRONATE IV KIT: 25.0000 | PACK | Freq: Once | INTRAVENOUS | Status: AC | PRN

## 2015-02-19 ENCOUNTER — Other Ambulatory Visit: Payer: Self-pay | Admitting: Neurological Surgery

## 2015-02-19 DIAGNOSIS — M48061 Spinal stenosis, lumbar region without neurogenic claudication: Secondary | ICD-10-CM

## 2015-03-02 ENCOUNTER — Ambulatory Visit
Admission: RE | Admit: 2015-03-02 | Discharge: 2015-03-02 | Disposition: A | Discharge status: Home / Self Care | Payer: BLUE CROSS/BLUE SHIELD | Source: Ambulatory Visit | Attending: Neurological Surgery | Admitting: Neurological Surgery

## 2015-03-02 VITALS — BP 161/78 | HR 83

## 2015-03-02 DIAGNOSIS — M1610 Unilateral primary osteoarthritis, unspecified hip: Secondary | ICD-10-CM

## 2015-03-02 DIAGNOSIS — M48061 Spinal stenosis, lumbar region without neurogenic claudication: Secondary | ICD-10-CM

## 2015-03-02 MED ORDER — DIAZEPAM 5 MG PO TABS
10.0000 mg | ORAL_TABLET | Freq: Once | ORAL | Status: AC
  Administered 2015-03-02: 10 mg via ORAL

## 2015-03-02 MED ORDER — OXYCODONE-ACETAMINOPHEN 5-325 MG PO TABS
2.0000 | ORAL_TABLET | Freq: Once | ORAL | Status: AC
  Administered 2015-03-02: 2 via ORAL

## 2015-03-02 MED ORDER — HYDROMORPHONE HCL 2 MG/ML IJ SOLN
1.5000 mg | Freq: Once | INTRAMUSCULAR | Status: AC
  Administered 2015-03-02: 1.5 mg via INTRAMUSCULAR

## 2015-03-02 MED ORDER — HYDROXYZINE HCL 50 MG/ML IM SOLN
25.0000 mg | Freq: Once | INTRAMUSCULAR | Status: AC
  Administered 2015-03-02: 25 mg via INTRAMUSCULAR

## 2015-03-02 MED ORDER — IOHEXOL 180 MG/ML  SOLN
15.0000 mL | Freq: Once | INTRAMUSCULAR | Status: DC | PRN
  Administered 2015-03-02: 15 mL via INTRATHECAL

## 2015-03-02 NOTE — Discharge Instructions (Signed)

## 2015-06-02 ENCOUNTER — Other Ambulatory Visit: Payer: Self-pay | Admitting: Neurological Surgery

## 2015-06-10 ENCOUNTER — Encounter (HOSPITAL_COMMUNITY): Payer: Self-pay

## 2015-06-10 ENCOUNTER — Encounter (HOSPITAL_COMMUNITY)
Admission: RE | Admit: 2015-06-10 | Discharge: 2015-06-10 | Disposition: A | Discharge status: Home / Self Care | Payer: BLUE CROSS/BLUE SHIELD | Source: Ambulatory Visit | Attending: Neurological Surgery | Admitting: Neurological Surgery

## 2015-06-10 DIAGNOSIS — Z7982 Long term (current) use of aspirin: Secondary | ICD-10-CM | POA: Insufficient documentation

## 2015-06-10 DIAGNOSIS — Z96642 Presence of left artificial hip joint: Secondary | ICD-10-CM | POA: Diagnosis not present

## 2015-06-10 DIAGNOSIS — E785 Hyperlipidemia, unspecified: Secondary | ICD-10-CM | POA: Diagnosis not present

## 2015-06-10 DIAGNOSIS — M4310 Spondylolisthesis, site unspecified: Secondary | ICD-10-CM | POA: Insufficient documentation

## 2015-06-10 DIAGNOSIS — G4733 Obstructive sleep apnea (adult) (pediatric): Secondary | ICD-10-CM | POA: Diagnosis not present

## 2015-06-10 DIAGNOSIS — Z79899 Other long term (current) drug therapy: Secondary | ICD-10-CM | POA: Insufficient documentation

## 2015-06-10 DIAGNOSIS — Z01812 Encounter for preprocedural laboratory examination: Secondary | ICD-10-CM | POA: Insufficient documentation

## 2015-06-10 DIAGNOSIS — Z01818 Encounter for other preprocedural examination: Secondary | ICD-10-CM | POA: Insufficient documentation

## 2015-06-10 DIAGNOSIS — Z0183 Encounter for blood typing: Secondary | ICD-10-CM | POA: Diagnosis not present

## 2015-06-10 DIAGNOSIS — I10 Essential (primary) hypertension: Secondary | ICD-10-CM | POA: Insufficient documentation

## 2015-06-10 HISTORY — DX: Obstructive sleep apnea (adult) (pediatric): Z99.89

## 2015-06-10 HISTORY — DX: Unspecified osteoarthritis, unspecified site: M19.90

## 2015-06-10 HISTORY — DX: Obstructive sleep apnea (adult) (pediatric): G47.33

## 2015-06-10 LAB — BASIC METABOLIC PANEL
Anion gap: 11 (ref 5–15)
BUN: 14 mg/dL (ref 6–20)
CO2: 27 mmol/L (ref 22–32)
CREATININE: 0.87 mg/dL (ref 0.61–1.24)
Calcium: 9.6 mg/dL (ref 8.9–10.3)
Chloride: 102 mmol/L (ref 101–111)
GFR calc Af Amer: >60 mL/min (ref >60)
GLUCOSE: 110 mg/dL — AB (ref 65–99)
Potassium: 4.6 mmol/L (ref 3.5–5.1)
SODIUM: 140 mmol/L (ref 135–145)

## 2015-06-10 LAB — CBC WITH DIFFERENTIAL/PLATELET
BASOS ABS: 0 10*3/uL (ref 0.0–0.1)
Basophils Relative: 1 %
EOS ABS: 0.3 10*3/uL (ref 0.0–0.7)
EOS PCT: 3 %
HCT: 44.3 % (ref 39.0–52.0)
Hemoglobin: 14.3 g/dL (ref 13.0–17.0)
Lymphocytes Relative: 31 %
Lymphs Abs: 2.6 10*3/uL (ref 0.7–4.0)
MCH: 28.4 pg (ref 26.0–34.0)
MCHC: 32.3 g/dL (ref 30.0–36.0)
MCV: 87.9 fL (ref 78.0–100.0)
MONO ABS: 0.6 10*3/uL (ref 0.1–1.0)
Monocytes Relative: 7 %
Neutro Abs: 4.8 10*3/uL (ref 1.7–7.7)
Neutrophils Relative %: 58 %
PLATELETS: 235 10*3/uL (ref 150–400)
RBC: 5.04 MIL/uL (ref 4.22–5.81)
RDW: 13.2 % (ref 11.5–15.5)
WBC: 8.3 10*3/uL (ref 4.0–10.5)

## 2015-06-10 LAB — SURGICAL PCR SCREEN
MRSA, PCR: NEGATIVE
STAPHYLOCOCCUS AUREUS: NEGATIVE

## 2015-06-10 LAB — TYPE AND SCREEN
ABO/RH(D): A POS
Antibody Screen: NEGATIVE

## 2015-06-10 LAB — PROTIME-INR
INR: 0.95 (ref 0.00–1.49)
PROTHROMBIN TIME: 12.9 s (ref 11.6–15.2)

## 2015-06-10 NOTE — Pre-Procedure Instructions (Signed)
[name]  06/10/2015      [facility] - [place], [place] - [address] [place] [address] Phone: [phone] Fax: [phone]    Your procedure is scheduled on Thursday March 2.  Report to [facility] Admitting at 1020 A.M.  Call this number if you have problems the morning of surgery:  [phone]   Remember:  Do not eat food or drink liquids after midnight Wednesday March 1.  Take these medicines the morning of surgery with A SIP OF WATER docusate sodium (colace) if needed, methocarbamol (robaxin) if needed, morphine (ms contin) if needed, oxycodone-acetaminophen (percocet) if needed  STOP: ALL Vitamins, Supplements, Effient and Herbal Medications, Fish Oils, Aspirins, NSAIDs (Nonsteroidal Anti-inflammatories such as Ibuprofen, Aleve, or Advil), and Goody's/BC Powders today, until after surgery as directed by your physician.    Do not wear jewelry, make-up or nail polish.  Do not wear lotions, powders, or perfumes.  You may wear deodorant.  Men may shave face and neck.  Do not bring valuables to the hospital.  [facility] is not responsible for any belongings or valuables.  Contacts, dentures or bridgework may not be worn into surgery.  Leave your suitcase in the car.  After surgery it may be brought to your room.  For patients admitted to the hospital, discharge time will be determined by your treatment team.  Patients discharged the day of surgery will not be allowed to drive home.        Preparing for Surgery at [facility]  Before surgery, you can play an important role.  Because skin is not sterile, your skin needs to be as free of germs as possible.  You can reduce the number of germs on your skin by washing with CHG (chlorahexidine gluconate) Soap before surgery.  CHG is an antiseptic cleaner with kills germs and bonds with the skin to continue killing germs even after washing.   Please  do not use if you have an allergy to CHG or antibacterial soaps.  If your skin becomes reddened/irritated stop using the CHG.  Do not shave (including legs and underarms) for at least 48 hours prior to first CHG shower.  It is okay to shave your face.  Please follow these instructions carefully:  1. Shower with CHG Soap the night before surgery and the morning of Surgery. 2. If you choose to wash your hair, wash your hair first as usual with your normal shampoo. 3. After you shampoo, rinse your hair and body thoroughly to remove the Shampoo. 4. Use CHG as you would any other liquid soap. You can apply chg directly to the skin and wash gently with scrungie or a clean washcloth. 5. Apply the CHG Soap to your body ONLY FROM THE NECK DOWN. Do not use on open wounds or open sores. Avoid contact with your eyes, ears, mouth and genitals (private parts). Wash genitals (private parts) with your normal soap. 6. Wash thoroughly, paying special attention to the area where your surgery will be performed. 7. Thoroughly rinse your body with warm water from the neck down. 8. DO NOT shower/wash with your normal soap after using and rinsing off the CHG Soap. 9. Pat yourself dry with a clean towel.  10. Wear clean pajamas.  11. Place clean sheets on your bed the night of your first shower and do not sleep with pets.  Day of Surgery  Do not apply any lotions/deodorants the morning of  surgery. Please wear clean clothes to the hospital/surgery center.   Please read over the following fact sheets that you were given. Pain Booklet, Coughing and Deep Breathing, Blood Transfusion Information, MRSA Information and Surgical Site Infection Prevention

## 2015-06-10 NOTE — Progress Notes (Addendum)
Saw Dr. Deborah Chalk for cardiology for precautionary measures due to morbid obesity and family history of heart disease.  Had a stress test done in 06/23/2010 and echocardiogram 2008.  Denies any heart issues in the past or having to see a cardiologist other than that. Currently assigned to Dr. Antoine Poche but has not had to see him as yet for any needs.   Delbert Phenix, MD

## 2015-06-11 NOTE — Progress Notes (Addendum)
Anesthesia Chart Review:  Pt is a 60 year old male scheduled for L3-4, L4-5 maximum access PLIF, L1-2 laminectomy on 06/17/2015 with Dr. Yetta Barre.   PCP is Dr. [name].   PMH includes:  HTN, OSA (on CPAP), hyperlipidemia. Never smoker. BMI 42. S/p L THA 05/01/11.   Medications include: ASA, lipitor, viagra.   BP 173/90 at PAT. Spoke with pt by telephone. Pt stopped HTN meds years ago when he lost 90 pounds and he no longer needed medication. His bp routinely is high in health care settings, acceptable at home. His PCP is aware, has checked the reliability of pt's home bp machine and it is working properly. Yesterday at home prior to PAT appointment, bp was 147/89.   Preoperative labs reviewed.    Chest x-ray 06/10/15 reviewed. No active cardiopulmonary disease.   EKG 06/10/15: NSR.   Nuclear stress test 06/23/10: Normal stress nuclear study.  Echo 11/20/06:  - Mild LVH with impaired relaxation - normal LV systolic function - Mild aortic valve sclerosis with no gradient  If no changes, I anticipate pt can proceed with surgery as scheduled.   Rica Mast, FNP-BC [facility] Short Stay [facility]/Anesthesiology Phone: [phone] 06/11/2015 10:24 AM  Addendum:  Received call from pt. He saw PCP because he was worried his elevated BP at PAT would interfere with surgery. Decision was made to start pt on losartan. Pt knows not to take losartan DOS.   Rica Mast, FNP-BC [facility] Short Stay Surgical Center/Anesthesiology Phone: [phone] 06/15/2015 3:30 PM

## 2015-06-16 MED ORDER — DEXTROSE 5 % IV SOLN
3.0000 g | INTRAVENOUS | Status: AC
  Administered 2015-06-17 (×2): 3 g via INTRAVENOUS
  Filled 2015-06-16: qty 3000

## 2015-06-16 MED ORDER — DEXAMETHASONE SODIUM PHOSPHATE 10 MG/ML IJ SOLN
10.0000 mg | INTRAMUSCULAR | Status: AC
  Administered 2015-06-17: 10 mg via INTRAVENOUS
  Filled 2015-06-16: qty 1

## 2015-06-16 MED ORDER — CEFAZOLIN SODIUM-DEXTROSE 2-3 GM-% IV SOLR: 2.0000 g | INTRAVENOUS | Status: DC

## 2015-06-17 ENCOUNTER — Inpatient Hospital Stay (HOSPITAL_COMMUNITY): Payer: BLUE CROSS/BLUE SHIELD | Admitting: Certified Registered"

## 2015-06-17 ENCOUNTER — Inpatient Hospital Stay (HOSPITAL_COMMUNITY)
Admission: AD | Admit: 2015-06-17 | Discharge: 2015-06-20 | DRG: 460 | Disposition: A | Discharge status: Home / Self Care | Payer: BLUE CROSS/BLUE SHIELD | Source: Ambulatory Visit | Attending: Neurological Surgery | Admitting: Neurological Surgery

## 2015-06-17 ENCOUNTER — Inpatient Hospital Stay (HOSPITAL_COMMUNITY): Payer: BLUE CROSS/BLUE SHIELD

## 2015-06-17 ENCOUNTER — Encounter (HOSPITAL_COMMUNITY)
Admission: AD | Disposition: A | Discharge status: Home / Self Care | Payer: Self-pay | Source: Ambulatory Visit | Attending: Neurological Surgery

## 2015-06-17 ENCOUNTER — Encounter (HOSPITAL_COMMUNITY): Payer: Self-pay | Admitting: *Deleted

## 2015-06-17 ENCOUNTER — Inpatient Hospital Stay (HOSPITAL_COMMUNITY): Payer: BLUE CROSS/BLUE SHIELD | Admitting: Emergency Medicine

## 2015-06-17 DIAGNOSIS — M4806 Spinal stenosis, lumbar region: Secondary | ICD-10-CM | POA: Diagnosis present

## 2015-06-17 DIAGNOSIS — Z6841 Body Mass Index (BMI) 40.0 and over, adult: Secondary | ICD-10-CM

## 2015-06-17 DIAGNOSIS — Z96641 Presence of right artificial hip joint: Secondary | ICD-10-CM | POA: Diagnosis present

## 2015-06-17 DIAGNOSIS — Z79899 Other long term (current) drug therapy: Secondary | ICD-10-CM

## 2015-06-17 DIAGNOSIS — I10 Essential (primary) hypertension: Secondary | ICD-10-CM | POA: Diagnosis present

## 2015-06-17 DIAGNOSIS — M4316 Spondylolisthesis, lumbar region: Secondary | ICD-10-CM | POA: Diagnosis present

## 2015-06-17 DIAGNOSIS — Z7982 Long term (current) use of aspirin: Secondary | ICD-10-CM

## 2015-06-17 DIAGNOSIS — Z419 Encounter for procedure for purposes other than remedying health state, unspecified: Secondary | ICD-10-CM

## 2015-06-17 DIAGNOSIS — G834 Cauda equina syndrome: Secondary | ICD-10-CM | POA: Diagnosis present

## 2015-06-17 DIAGNOSIS — G4733 Obstructive sleep apnea (adult) (pediatric): Secondary | ICD-10-CM | POA: Diagnosis present

## 2015-06-17 DIAGNOSIS — Z79891 Long term (current) use of opiate analgesic: Secondary | ICD-10-CM | POA: Diagnosis not present

## 2015-06-17 DIAGNOSIS — Z981 Arthrodesis status: Secondary | ICD-10-CM

## 2015-06-17 DIAGNOSIS — M1990 Unspecified osteoarthritis, unspecified site: Secondary | ICD-10-CM | POA: Diagnosis present

## 2015-06-17 DIAGNOSIS — E785 Hyperlipidemia, unspecified: Secondary | ICD-10-CM | POA: Diagnosis present

## 2015-06-17 DIAGNOSIS — Z96653 Presence of artificial knee joint, bilateral: Secondary | ICD-10-CM | POA: Diagnosis present

## 2015-06-17 HISTORY — PX: MAXIMUM ACCESS (MAS)POSTERIOR LUMBAR INTERBODY FUSION (PLIF) 2 LEVEL: SHX6369

## 2015-06-17 HISTORY — DX: Low back pain: M54.5

## 2015-06-17 HISTORY — DX: Other chronic pain: G89.29

## 2015-06-17 HISTORY — DX: Low back pain, unspecified: M54.50

## 2015-06-17 SURGERY — FOR MAXIMUM ACCESS (MAS) POSTERIOR LUMBAR INTERBODY FUSION (PLIF) 2 LEVEL
Anesthesia: General | Site: Spine Lumbar

## 2015-06-17 MED ORDER — MIDAZOLAM HCL 2 MG/2ML IJ SOLN
INTRAMUSCULAR | Status: AC
  Filled 2015-06-17: qty 2

## 2015-06-17 MED ORDER — MIDAZOLAM HCL 5 MG/5ML IJ SOLN
INTRAMUSCULAR | Status: DC | PRN
  Administered 2015-06-17 (×2): 2 mg via INTRAVENOUS

## 2015-06-17 MED ORDER — PROPOFOL 10 MG/ML IV BOLUS
INTRAVENOUS | Status: DC | PRN
  Administered 2015-06-17: 200 mg via INTRAVENOUS

## 2015-06-17 MED ORDER — VANCOMYCIN HCL 1000 MG IV SOLR
INTRAVENOUS | Status: AC
  Administered 2015-06-17: 16:00:00
  Filled 2015-06-17: qty 1000

## 2015-06-17 MED ORDER — THROMBIN 20000 UNITS EX SOLR
CUTANEOUS | Status: DC | PRN
  Administered 2015-06-17: 12:00:00 via TOPICAL

## 2015-06-17 MED ORDER — DIPHENHYDRAMINE HCL 50 MG/ML IJ SOLN: 12.5000 mg | Freq: Four times a day (QID) | INTRAMUSCULAR | Status: DC | PRN

## 2015-06-17 MED ORDER — METHOCARBAMOL 500 MG PO TABS
ORAL_TABLET | ORAL | Status: AC
  Administered 2015-06-17: 17:00:00
  Filled 2015-06-17: qty 1

## 2015-06-17 MED ORDER — SUCCINYLCHOLINE CHLORIDE 20 MG/ML IJ SOLN
INTRAMUSCULAR | Status: DC | PRN
  Administered 2015-06-17: 100 mg via INTRAVENOUS

## 2015-06-17 MED ORDER — THROMBIN 5000 UNITS EX SOLR
OROMUCOSAL | Status: DC | PRN
  Administered 2015-06-17: 12:00:00 via TOPICAL

## 2015-06-17 MED ORDER — POTASSIUM CHLORIDE IN NACL 20-0.9 MEQ/L-% IV SOLN
INTRAVENOUS | Status: DC
  Administered 2015-06-17: 75 mL/h via INTRAVENOUS
  Filled 2015-06-17 (×6): qty 1000

## 2015-06-17 MED ORDER — LACTATED RINGERS IV SOLN
INTRAVENOUS | Status: DC
  Administered 2015-06-17 (×3): via INTRAVENOUS

## 2015-06-17 MED ORDER — OXYCODONE HCL 5 MG/5ML PO SOLN: 5.0000 mg | Freq: Once | ORAL | Status: AC | PRN

## 2015-06-17 MED ORDER — ASPIRIN 325 MG PO TABS
325.0000 mg | ORAL_TABLET | Freq: Every day | ORAL | Status: DC
  Administered 2015-06-18 – 2015-06-20 (×3): 325 mg via ORAL
  Filled 2015-06-17 (×3): qty 1

## 2015-06-17 MED ORDER — KETAMINE HCL 100 MG/ML IJ SOLN
INTRAMUSCULAR | Status: AC
  Administered 2015-06-17 (×3): 10 mg via INTRAVENOUS
  Administered 2015-06-17: 60 mg via INTRAVENOUS
  Administered 2015-06-17: 10 mg via INTRAVENOUS
  Filled 2015-06-17: qty 1

## 2015-06-17 MED ORDER — HYDROMORPHONE HCL 1 MG/ML IJ SOLN
INTRAMUSCULAR | Status: AC
  Administered 2015-06-17: 17:00:00
  Filled 2015-06-17: qty 1

## 2015-06-17 MED ORDER — OXYCODONE HCL 5 MG PO TABS
ORAL_TABLET | ORAL | Status: AC
  Filled 2015-06-17: qty 1

## 2015-06-17 MED ORDER — EPHEDRINE SULFATE 50 MG/ML IJ SOLN
INTRAMUSCULAR | Status: DC | PRN
  Administered 2015-06-17: 15 mg via INTRAVENOUS

## 2015-06-17 MED ORDER — SODIUM CHLORIDE 0.9 % IV SOLN: 250.0000 mL | INTRAVENOUS | Status: DC

## 2015-06-17 MED ORDER — 0.9 % SODIUM CHLORIDE (POUR BTL) OPTIME
TOPICAL | Status: DC | PRN
  Administered 2015-06-17: 1000 mL

## 2015-06-17 MED ORDER — MORPHINE SULFATE 2 MG/ML IV SOLN
INTRAVENOUS | Status: DC
  Administered 2015-06-17 – 2015-06-18 (×2): via INTRAVENOUS
  Administered 2015-06-18 – 2015-06-19 (×2): 12 mg via INTRAVENOUS
  Administered 2015-06-19: 04:00:00 via INTRAVENOUS
  Filled 2015-06-17 (×3): qty 25

## 2015-06-17 MED ORDER — MENTHOL 3 MG MT LOZG: 1.0000 | LOZENGE | OROMUCOSAL | Status: DC | PRN

## 2015-06-17 MED ORDER — ACETAMINOPHEN 650 MG RE SUPP: 650.0000 mg | RECTAL | Status: DC | PRN

## 2015-06-17 MED ORDER — ONDANSETRON HCL 4 MG/2ML IJ SOLN
INTRAMUSCULAR | Status: DC | PRN
  Administered 2015-06-17: 4 mg via INTRAVENOUS

## 2015-06-17 MED ORDER — CEFAZOLIN SODIUM 1-5 GM-% IV SOLN
1.0000 g | Freq: Three times a day (TID) | INTRAVENOUS | Status: AC
  Administered 2015-06-17 – 2015-06-18 (×2): 1 g via INTRAVENOUS
  Filled 2015-06-17 (×2): qty 50

## 2015-06-17 MED ORDER — CELECOXIB 200 MG PO CAPS
200.0000 mg | ORAL_CAPSULE | Freq: Two times a day (BID) | ORAL | Status: DC
  Administered 2015-06-17 – 2015-06-20 (×6): 200 mg via ORAL
  Filled 2015-06-17 (×6): qty 1

## 2015-06-17 MED ORDER — PROMETHAZINE HCL 25 MG/ML IJ SOLN: 6.2500 mg | INTRAMUSCULAR | Status: DC | PRN

## 2015-06-17 MED ORDER — LIDOCAINE HCL (CARDIAC) 20 MG/ML IV SOLN
INTRAVENOUS | Status: AC
  Filled 2015-06-17: qty 5

## 2015-06-17 MED ORDER — SODIUM CHLORIDE 0.9% FLUSH
3.0000 mL | Freq: Two times a day (BID) | INTRAVENOUS | Status: DC
  Administered 2015-06-18 – 2015-06-19 (×2): 3 mL via INTRAVENOUS

## 2015-06-17 MED ORDER — METHOCARBAMOL 1000 MG/10ML IJ SOLN: 500.0000 mg | Freq: Four times a day (QID) | INTRAMUSCULAR | Status: DC | PRN

## 2015-06-17 MED ORDER — LIDOCAINE HCL (CARDIAC) 20 MG/ML IV SOLN
INTRAVENOUS | Status: DC | PRN
  Administered 2015-06-17: 100 mg via INTRAVENOUS

## 2015-06-17 MED ORDER — SODIUM CHLORIDE 0.9 % IJ SOLN
INTRAMUSCULAR | Status: AC
  Filled 2015-06-17: qty 10

## 2015-06-17 MED ORDER — ALBUMIN HUMAN 5 % IV SOLN
INTRAVENOUS | Status: DC | PRN
  Administered 2015-06-17: 15:00:00 via INTRAVENOUS

## 2015-06-17 MED ORDER — LOSARTAN POTASSIUM 50 MG PO TABS
25.0000 mg | ORAL_TABLET | Freq: Every day | ORAL | Status: DC
  Administered 2015-06-17 – 2015-06-20 (×4): 25 mg via ORAL
  Filled 2015-06-17 (×4): qty 1

## 2015-06-17 MED ORDER — PHENOL 1.4 % MT LIQD
1.0000 | OROMUCOSAL | Status: DC | PRN
Start: 2015-06-17 — End: 2015-06-20

## 2015-06-17 MED ORDER — MORPHINE SULFATE (PF) 2 MG/ML IV SOLN
1.0000 mg | INTRAVENOUS | Status: DC | PRN
  Administered 2015-06-17 – 2015-06-18 (×3): 4 mg via INTRAVENOUS
  Filled 2015-06-17 (×3): qty 2

## 2015-06-17 MED ORDER — DOCUSATE SODIUM 100 MG PO CAPS
100.0000 mg | ORAL_CAPSULE | Freq: Two times a day (BID) | ORAL | Status: DC
  Administered 2015-06-17 – 2015-06-20 (×6): 100 mg via ORAL
  Filled 2015-06-17 (×6): qty 1

## 2015-06-17 MED ORDER — SUFENTANIL CITRATE 50 MCG/ML IV SOLN
INTRAVENOUS | Status: AC
  Filled 2015-06-17: qty 1

## 2015-06-17 MED ORDER — SODIUM CHLORIDE 0.9% FLUSH: 3.0000 mL | INTRAVENOUS | Status: DC | PRN

## 2015-06-17 MED ORDER — HYDROMORPHONE HCL 1 MG/ML IJ SOLN
0.2500 mg | INTRAMUSCULAR | Status: DC | PRN
  Administered 2015-06-17 (×4): 0.5 mg via INTRAVENOUS

## 2015-06-17 MED ORDER — CEFAZOLIN SODIUM-DEXTROSE 2-3 GM-% IV SOLR
INTRAVENOUS | Status: AC
  Filled 2015-06-17: qty 50

## 2015-06-17 MED ORDER — ONDANSETRON HCL 4 MG/2ML IJ SOLN
4.0000 mg | INTRAMUSCULAR | Status: DC | PRN
  Administered 2015-06-17 (×2): 4 mg via INTRAVENOUS
  Filled 2015-06-17 (×2): qty 2

## 2015-06-17 MED ORDER — METHOCARBAMOL 500 MG PO TABS
500.0000 mg | ORAL_TABLET | Freq: Four times a day (QID) | ORAL | Status: DC | PRN
  Administered 2015-06-17 – 2015-06-20 (×7): 500 mg via ORAL
  Filled 2015-06-17 (×7): qty 1

## 2015-06-17 MED ORDER — SODIUM CHLORIDE 0.9% FLUSH: 9.0000 mL | INTRAVENOUS | Status: DC | PRN

## 2015-06-17 MED ORDER — SODIUM CHLORIDE 0.9 % IR SOLN
Status: DC | PRN
  Administered 2015-06-17 (×2)

## 2015-06-17 MED ORDER — PROPOFOL 10 MG/ML IV BOLUS
INTRAVENOUS | Status: AC
  Filled 2015-06-17: qty 20

## 2015-06-17 MED ORDER — DIPHENHYDRAMINE HCL 12.5 MG/5ML PO ELIX: 12.5000 mg | ORAL_SOLUTION | Freq: Four times a day (QID) | ORAL | Status: DC | PRN

## 2015-06-17 MED ORDER — OXYCODONE HCL 5 MG PO TABS
5.0000 mg | ORAL_TABLET | Freq: Once | ORAL | Status: AC | PRN
  Administered 2015-06-17: 5 mg via ORAL

## 2015-06-17 MED ORDER — MORPHINE SULFATE (PF) 4 MG/ML IV SOLN
4.0000 mg | Freq: Once | INTRAVENOUS | Status: AC
  Administered 2015-06-17: 4 mg via INTRAVENOUS
  Filled 2015-06-17: qty 1

## 2015-06-17 MED ORDER — VANCOMYCIN HCL POWD
Status: DC | PRN
Start: 2015-06-17 — End: 2015-06-17
  Administered 2015-06-17: 2000 mg via SURGICAL_CAVITY

## 2015-06-17 MED ORDER — ONDANSETRON HCL 4 MG/2ML IJ SOLN
INTRAMUSCULAR | Status: AC
  Filled 2015-06-17: qty 2

## 2015-06-17 MED ORDER — NALOXONE HCL 0.4 MG/ML IJ SOLN: 0.4000 mg | INTRAMUSCULAR | Status: DC | PRN

## 2015-06-17 MED ORDER — ADULT MULTIVITAMIN W/MINERALS CH
1.0000 | ORAL_TABLET | Freq: Every day | ORAL | Status: DC
  Administered 2015-06-18 – 2015-06-20 (×3): 1 via ORAL
  Filled 2015-06-17 (×3): qty 1

## 2015-06-17 MED ORDER — OXYCODONE-ACETAMINOPHEN 5-325 MG PO TABS
ORAL_TABLET | ORAL | Status: AC
  Filled 2015-06-17: qty 2

## 2015-06-17 MED ORDER — SUFENTANIL CITRATE 50 MCG/ML IV SOLN
INTRAVENOUS | Status: DC | PRN
  Administered 2015-06-17: 20 ug via INTRAVENOUS
  Administered 2015-06-17 (×4): 10 ug via INTRAVENOUS

## 2015-06-17 MED ORDER — OXYCODONE-ACETAMINOPHEN 5-325 MG PO TABS
1.0000 | ORAL_TABLET | ORAL | Status: DC | PRN
  Administered 2015-06-17: 2 via ORAL
  Filled 2015-06-17: qty 2

## 2015-06-17 MED ORDER — MORPHINE SULFATE ER 30 MG PO TBCR
30.0000 mg | EXTENDED_RELEASE_TABLET | Freq: Two times a day (BID) | ORAL | Status: DC
  Administered 2015-06-17 – 2015-06-20 (×6): 30 mg via ORAL
  Filled 2015-06-17 (×6): qty 1

## 2015-06-17 MED ORDER — ACETAMINOPHEN 325 MG PO TABS
650.0000 mg | ORAL_TABLET | ORAL | Status: DC | PRN
  Administered 2015-06-19 – 2015-06-20 (×3): 650 mg via ORAL
  Filled 2015-06-17 (×3): qty 2

## 2015-06-17 MED ORDER — CEFAZOLIN SODIUM 1-5 GM-% IV SOLN
INTRAVENOUS | Status: AC
  Administered 2015-06-17: 16:00:00
  Filled 2015-06-17: qty 50

## 2015-06-17 MED FILL — Vancomycin HCl For IV Soln 1 GM (Base Equivalent): INTRAVENOUS | Qty: 2000 | Status: AC

## 2015-06-17 SURGICAL SUPPLY — 68 items
BAG DECANTER FOR FLEXI CONT (MISCELLANEOUS) ×2 IMPLANT
BENZOIN TINCTURE PRP APPL 2/3 (GAUZE/BANDAGES/DRESSINGS) ×2 IMPLANT
BIT DRILL PLIF MAS DISP 5.5MM (DRILL) ×1 IMPLANT
BLADE CLIPPER SURG (BLADE) IMPLANT
BONE MATRIX OSTEOCEL PRO MED (Bone Implant) ×2 IMPLANT
BUR MATCHSTICK NEURO 3.0 LAGG (BURR) ×2 IMPLANT
CAGE COROENT LRG 9X9X28-8 (Cage) ×4 IMPLANT
CANISTER SUCT 3000ML PPV (MISCELLANEOUS) ×2 IMPLANT
CLIP NEUROVISION LG (CLIP) ×2 IMPLANT
CONT SPEC 4OZ CLIKSEAL STRL BL (MISCELLANEOUS) ×2 IMPLANT
COVER BACK TABLE 24X17X13 BIG (DRAPES) IMPLANT
COVER BACK TABLE 60X90IN (DRAPES) ×2 IMPLANT
DERMABOND ADVANCED (GAUZE/BANDAGES/DRESSINGS) ×2
DERMABOND ADVANCED .7 DNX12 (GAUZE/BANDAGES/DRESSINGS) ×2 IMPLANT
DRAPE C-ARM 42X72 X-RAY (DRAPES) ×2 IMPLANT
DRAPE C-ARMOR (DRAPES) ×2 IMPLANT
DRAPE LAPAROTOMY 100X72X124 (DRAPES) ×2 IMPLANT
DRAPE POUCH INSTRU U-SHP 10X18 (DRAPES) ×2 IMPLANT
DRAPE SURG 17X23 STRL (DRAPES) ×2 IMPLANT
DRILL PLIF MAS DISP 5.5MM (DRILL) ×2
DRSG OPSITE 4X5.5 SM (GAUZE/BANDAGES/DRESSINGS) ×2 IMPLANT
DRSG OPSITE POSTOP 4X8 (GAUZE/BANDAGES/DRESSINGS) ×2 IMPLANT
DURAPREP 26ML APPLICATOR (WOUND CARE) ×2 IMPLANT
ELECT BLADE 4.0 EZ CLEAN MEGAD (MISCELLANEOUS) ×2
ELECT REM PT RETURN 9FT ADLT (ELECTROSURGICAL) ×2
ELECTRODE BLDE 4.0 EZ CLN MEGD (MISCELLANEOUS) ×1 IMPLANT
ELECTRODE REM PT RTRN 9FT ADLT (ELECTROSURGICAL) ×1 IMPLANT
EVACUATOR 1/8 PVC DRAIN (DRAIN) IMPLANT
GAUZE SPONGE 4X4 16PLY XRAY LF (GAUZE/BANDAGES/DRESSINGS) IMPLANT
GLOVE BIO SURGEON STRL SZ7 (GLOVE) ×4 IMPLANT
GLOVE BIO SURGEON STRL SZ8 (GLOVE) ×6 IMPLANT
GLOVE BIO SURGEON STRL SZ8.5 (GLOVE) ×2 IMPLANT
GLOVE BIOGEL PI IND STRL 6.5 (GLOVE) ×2 IMPLANT
GLOVE BIOGEL PI IND STRL 7.0 (GLOVE) ×2 IMPLANT
GLOVE BIOGEL PI INDICATOR 6.5 (GLOVE) ×2
GLOVE BIOGEL PI INDICATOR 7.0 (GLOVE) ×2
GOWN STRL REUS W/ TWL LRG LVL3 (GOWN DISPOSABLE) ×2 IMPLANT
GOWN STRL REUS W/ TWL XL LVL3 (GOWN DISPOSABLE) ×2 IMPLANT
GOWN STRL REUS W/TWL 2XL LVL3 (GOWN DISPOSABLE) IMPLANT
GOWN STRL REUS W/TWL LRG LVL3 (GOWN DISPOSABLE) ×2
GOWN STRL REUS W/TWL XL LVL3 (GOWN DISPOSABLE) ×2
HEMOSTAT POWDER KIT SURGIFOAM (HEMOSTASIS) ×2 IMPLANT
KIT BASIN OR (CUSTOM PROCEDURE TRAY) ×2 IMPLANT
KIT ROOM TURNOVER OR (KITS) ×2 IMPLANT
MILL MEDIUM DISP (BLADE) ×2 IMPLANT
MODULE NVM5 NEXT GEN EMG (NEEDLE) ×2 IMPLANT
NEEDLE HYPO 25X1 1.5 SAFETY (NEEDLE) ×2 IMPLANT
NS IRRIG 1000ML POUR BTL (IV SOLUTION) ×2 IMPLANT
PACK LAMINECTOMY NEURO (CUSTOM PROCEDURE TRAY) ×2 IMPLANT
PAD ARMBOARD 7.5X6 YLW CONV (MISCELLANEOUS) ×6 IMPLANT
ROD 35MM (Rod) ×4 IMPLANT
SCREW LOCK (Screw) ×4 IMPLANT
SCREW LOCK FXNS SPNE MAS PL (Screw) ×4 IMPLANT
SCREW PLIF MAS 5.5X35 LUMBAR (Screw) ×4 IMPLANT
SCREW SHANKS 5.5X35 (Screw) ×4 IMPLANT
SCREW TULIP 5.5 (Screw) ×4 IMPLANT
SPONGE LAP 4X18 X RAY DECT (DISPOSABLE) IMPLANT
SPONGE SURGIFOAM ABS GEL 100 (HEMOSTASIS) ×2 IMPLANT
STRIP CLOSURE SKIN 1/2X4 (GAUZE/BANDAGES/DRESSINGS) ×2 IMPLANT
SUT VIC AB 0 CT1 18XCR BRD8 (SUTURE) ×1 IMPLANT
SUT VIC AB 0 CT1 8-18 (SUTURE) ×1
SUT VIC AB 2-0 CP2 18 (SUTURE) ×2 IMPLANT
SUT VIC AB 3-0 SH 8-18 (SUTURE) ×4 IMPLANT
SYR 3ML LL SCALE MARK (SYRINGE) IMPLANT
TOWEL OR 17X24 6PK STRL BLUE (TOWEL DISPOSABLE) ×2 IMPLANT
TOWEL OR 17X26 10 PK STRL BLUE (TOWEL DISPOSABLE) ×2 IMPLANT
TRAP SPECIMEN MUCOUS 40CC (MISCELLANEOUS) ×2 IMPLANT
WATER STERILE IRR 1000ML POUR (IV SOLUTION) ×2 IMPLANT

## 2015-06-17 NOTE — Progress Notes (Signed)
Pt rates pain 10/10 despite Morphine, Robaxin, and Percocet- see MAR for detailed times. MD on call notified, orders received and readback.. Cont to monitor.  Marvia Pickles, RN

## 2015-06-17 NOTE — Op Note (Signed)
06/17/2015  4:10 PM  PATIENT:  Pearson Grippe  60 y.o. male  PRE-OPERATIVE DIAGNOSIS:  1. Lumbar spinal stenosis L4-5 with grade 1 dynamic spondylolisthesis, 2. Spinal stenosis L3-4, 3. Spinal stenosis L1 -2, 4. Back and leg pain  POST-OPERATIVE DIAGNOSIS:  Same  PROCEDURE:   1. Decompressive lumbar laminectomy L4-5 requiring more work than would be required for a simple exposure of the disk for PLIF in order to adequately decompress the neural elements and address the spinal stenosis ; decompressive lumbar laminectomy, medial facetectomy and foraminotomy bilaterally L1-2 and L3-4, note these levels are separate from the plif level 2. Posterior lumbar interbody fusion L4-5 using PEEK interbody cages packed with morcellized allograft and autograft 3. Posterior fixation L4-5 using cortical pedicle screws.  4. Intertransverse arthrodesis L45 using morcellized autograft and allograft.  SURGEON:  Marikay Alar, MD  ASSISTANTS: Dr. Lovell Sheehan  ANESTHESIA:  General  EBL: 700 ml  Total I/O In: 2450 [I.V.:2000; Blood:200; IV Piggyback:250] Out: 925 [Urine:225; Blood:700]  BLOOD ADMINISTERED:200 CC PRBC  DRAINS: Hemovac   INDICATION FOR PROCEDURE: This patient presented with a long history of back and bilateral leg pain. Myelogram showed severe spinal stenosis with a complete myelographic block and a dynamic instability at L4-5 with spinal stenosis L3-4 and L1-2. He had undergone a previous decompressive laminectomy at L2-3 for a cauda equina syndrome. He tried medical management without relief. I recommended decompression and instrumented fusion to address his segmental instability and spinal stenosis. Patient understood the risks, benefits, and alternatives and potential outcomes and wished to proceed.  PROCEDURE DETAILS:  The patient was brought to the operating room. After induction of generalized endotracheal anesthesia the patient was rolled into the prone position on chest rolls and all  pressure points were padded. The patient's lumbar region was cleaned and then prepped with DuraPrep and draped in the usual sterile fashion. Anesthesia was injected and then a dorsal midline incision was made and carried down to the lumbosacral fascia. The fascia was opened and the paraspinous musculature was taken down in a subperiosteal fashion to expose L1-2, L3-4, and L4-5. A self-retaining retractor was placed. Intraoperative fluoroscopy confirmed my level, and I started with placement of the L4 cortical pedicle screws. The pedicle screw entry zones were identified utilizing surface landmarks and  AP and lateral fluoroscopy. I scored the cortex with the high-speed drill and then used the hand drill and EMG monitoring to drill an upward and outward direction into the pedicle. I then tapped line to line, and the tap was also monitored. I then placed a 5.5 x 35 mm cortical pedicle screw into the pedicles of L4 bilaterally. I then turned my attention to the decompression and the spinous process was removed and complete lumbar laminectomies, hemi- facetectomies, and foraminotomies were performed at L4-5 with inferior part of the spinous process at L3-4 being removed with bilateral hemilaminotomies and undercutting of L3-4 being performed.. The patient had significant spinal stenosis and this required more work than would be required for a simple exposure of the disc for posterior lumbar interbody fusion. Much more generous decompression was undertaken in order to adequately decompress the neural elements and address the patient's leg pain. The yellow ligament was removed to expose the underlying dura and nerve roots, and generous foraminotomies were performed to adequately decompress the neural elements. Both the exiting and traversing nerve roots were decompressed on both sides until a coronary dilator passed easily along the nerve roots. Once the decompression was complete, I turned my  attention to the posterior  lower lumbar interbody fusion. The epidural venous vasculature was coagulated and cut sharply. Disc space was incised and the initial discectomy was performed with pituitary rongeurs. The disc space was distracted with sequential distractors to a height of 10 mm. We then used a series of scrapers and shavers to prepare the endplates for fusion. The midline was prepared with Epstein curettes. Once the complete discectomy was finished, we packed an appropriate sized peek interbody cage with local autograft and morcellized allograft, gently retracted the nerve root, and tapped the cage into position at L4-5.  The midline between the cages was packed with morselized autograft and allograft. We then turned our attention to the placement of the lower pedicle screws. The pedicle screw entry zones were identified utilizing surface landmarks and fluoroscopy. I drilled into each pedicle utilizing the hand drill and EMG monitoring, and tapped each pedicle with the appropriate tap. We palpated with a ball probe to assure no break in the cortex. We then placed 5.5 x 35 mm cortical screws into the pedicles bilaterally at L5. We then decorticated the transverse processes and laid a mixture of morcellized autograft and allograft out over these to perform intertransverse arthrodesis at L4-5. We then placed lordotic rods into the multiaxial screw heads of the pedicle screws and locked these in position with the locking caps and anti-torque device. We then checked our construct with AP and lateral fluoroscopy. I then performed a left hemi-laminectomy, medial facetectomy, and foraminotomies at L1-2 after confirming our level with lateral fluoroscopy. A used a high-speed drill and the Kerrison punches to perform this hemilaminectomy. I undercut the spinous process and performed a sublaminar decompression. Undercut the lateral recess. There was calcified disc protrusion on the left. Operated to assure adequate decompression. Irrigated  with copious amounts of bacitracin-containing saline solution. Placed a medium Hemovac drain through separate stab incision. Inspected the nerve roots once again to assure adequate decompression, lined to the dura with Gelfoam, and closed the muscle and the fascia with 0 Vicryl. Closed the subcutaneous tissues with 2-0 Vicryl and subcuticular tissues with 3-0 Vicryl. The skin was closed with benzoin and Steri-Strips. Dressing was then applied, the patient was awakened from general anesthesia and transported to the recovery room in stable condition. At the end of the procedure all sponge, needle and instrument counts were correct.   PLAN OF CARE: Admit to inpatient   PATIENT DISPOSITION:  PACU - hemodynamically stable.   Delay start of Pharmacological VTE agent (>24hrs) due to surgical blood loss or risk of bleeding:  yes

## 2015-06-17 NOTE — Transfer of Care (Signed)
Immediate Anesthesia Transfer of Care Note  Patient: [name]  Procedure(s) Performed: Procedure(s): LUMBAR FOUR-LUMBAR FIVE MAXIMUM ACCESS (MAS) POSTERIOR LUMBAR INTERBODY FUSION (PLIF) WITH LUMBAR ONE-TWO, LUMBAR FOUR-FIVE LAMINECTOMY  (N/A)  Patient Location: PACU  Anesthesia Type:General  Level of Consciousness: awake, oriented and patient cooperative  Airway & Oxygen Therapy: Patient Spontanous Breathing and Patient connected to nasal cannula oxygen  Post-op Assessment: Report given to RN, Post -op Vital signs reviewed and stable and Patient moving all extremities  Post vital signs: Reviewed and stable  Last Vitals:  Filed Vitals:   06/17/15 1121  BP: 149/82  Pulse: 77  Temp: 37.3 C  Resp: 18    Complications: No apparent anesthesia complications

## 2015-06-17 NOTE — Anesthesia Preprocedure Evaluation (Addendum)
Anesthesia Evaluation  Patient identified by MRN, date of birth, ID band Patient awake    Reviewed: Allergy & Precautions, NPO status , Patient's Chart, lab work & pertinent test results  Airway Mallampati: II  TM Distance: >3 FB Neck ROM: Full    Dental  (+) Dental Advisory Given   Pulmonary sleep apnea and Continuous Positive Airway Pressure Ventilation ,    breath sounds clear to auscultation       Cardiovascular hypertension, Pt. on medications  Rhythm:Regular Rate:Normal     Neuro/Psych Spondylolisthesis    GI/Hepatic negative GI ROS, Neg liver ROS,   Endo/Other  Morbid obesity  Renal/GU negative Renal ROS     Musculoskeletal  (+) Arthritis ,   Abdominal   Peds  Hematology negative hematology ROS (+)   Anesthesia Other Findings   Reproductive/Obstetrics                            Lab Results  Component Value Date   WBC 8.3 06/10/2015   HGB 14.3 06/10/2015   HCT 44.3 06/10/2015   MCV 87.9 06/10/2015   PLT 235 06/10/2015   Lab Results  Component Value Date   CREATININE 0.87 06/10/2015   BUN 14 06/10/2015   NA 140 06/10/2015   K 4.6 06/10/2015   CL 102 06/10/2015   CO2 27 06/10/2015    Anesthesia Physical Anesthesia Plan  ASA: III  Anesthesia Plan: General   Post-op Pain Management:    Induction: Intravenous  Airway Management Planned: Oral ETT  Additional Equipment:   Intra-op Plan:   Post-operative Plan: Extubation in OR  Informed Consent: I have reviewed the patients History and Physical, chart, labs and discussed the procedure including the risks, benefits and alternatives for the proposed anesthesia with the patient or authorized representative who has indicated his/her understanding and acceptance.   Dental advisory given  Plan Discussed with: CRNA  Anesthesia Plan Comments:         Anesthesia Quick Evaluation

## 2015-06-17 NOTE — H&P (Signed)
Subjective: Patient is a 60 y.o. male admitted for PLIF. Onset of symptoms was several months ago, gradually worsening since that time.  The pain is rated severe, and is located at the across the lower back and radiates to bilateral legs. The pain is described as aching and occurs all day. The symptoms have been progressive. Symptoms are exacerbated by exercise. MRI or CT showed spondylolisthesis L4-5 with spinal stenosis L1-2 and L3-4.   Past Medical History  Diagnosis Date  . Hypertension   . Sleep apnea   . Hyperlipemia   . OSA on CPAP     settings 14  . Arthritis     Past Surgical History  Procedure Laterality Date  . Joint replacement      bilateral knee replacements  . Hip surgery      rt hip replacement  . Colonoscopy    . Total hip arthroplasty  05/01/2011    Procedure: TOTAL HIP ARTHROPLASTY;  Surgeon: Nestor Lewandowsky;  Location: [facility] OR;  Service: Orthopedics;  Laterality: Left;  DEPUY  . Knee surgery    . Back surgery    . Cervical spine surgery  1999    Dr Newell Coral  . Tonsillectomy      Prior to Admission medications   Medication Sig Start Date End Date Taking? Authorizing Provider  aspirin 325 MG tablet Take 325 mg by mouth daily.   Yes Historical Provider, MD  atorvastatin (LIPITOR) 40 MG tablet Take 40 mg by mouth daily.     Yes Historical Provider, MD  Cholecalciferol (VITAMIN D-3 PO) Take 2,000 Units by mouth daily.   Yes Historical Provider, MD  co-enzyme Q-10 30 MG capsule Take 30 mg by mouth daily.   Yes Historical Provider, MD  diphenhydramine-acetaminophen (TYLENOL PM) 25-500 MG TABS Take 2 tablets by mouth at bedtime as needed.   Yes Historical Provider, MD  docusate sodium (COLACE) 100 MG capsule Take 100 mg by mouth 2 (two) times daily.   Yes Historical Provider, MD  ketoprofen (ORUDIS) 75 MG capsule Take 75 mg by mouth 2 (two) times daily with a meal. 05/10/15  Yes Historical Provider, MD  losartan (COZAAR) 25 MG tablet Take 25 mg by mouth daily.   Yes  Historical Provider, MD  methocarbamol (ROBAXIN) 750 MG tablet Take 750 mg by mouth every 8 (eight) hours. 05/15/15  Yes Historical Provider, MD  morphine (MS CONTIN) 30 MG 12 hr tablet Take 30 mg by mouth 2 (two) times daily. 06/01/15  Yes Historical Provider, MD  Multiple Vitamins-Minerals (MULTIVITAMINS THER. W/MINERALS) TABS Take 1 tablet by mouth daily.     Yes Historical Provider, MD  oxyCODONE-acetaminophen (PERCOCET) 10-325 MG tablet Take 1 tablet by mouth 4 (four) times daily. 05/24/15  Yes Historical Provider, MD  sildenafil (VIAGRA) 100 MG tablet Take 100 mg by mouth as needed for erectile dysfunction.    Historical Provider, MD   No Known Allergies  Social History  Substance Use Topics  . Smoking status: Never Smoker   . Smokeless tobacco: Not on file  . Alcohol Use: No    History reviewed. No pertinent family history.   Review of Systems  Positive ROS: neg  All other systems have been reviewed and were otherwise negative with the exception of those mentioned in the HPI and as above.  Objective: Vital signs in last 24 hours: Weight:  [136.986 kg (302 lb)] 136.986 kg (302 lb) (03/02 1049)  General Appearance: Alert, cooperative, no distress, appears stated age Head: Normocephalic, without  obvious abnormality, atraumatic Eyes: PERRL, conjunctiva/corneas clear, EOM's intact    Neck: Supple, symmetrical, trachea midline Back: Symmetric, no curvature, ROM normal, no CVA tenderness Lungs:  respirations unlabored Heart: Regular rate and rhythm Abdomen: Soft, non-tender Extremities: Extremities normal, atraumatic, no cyanosis or edema Pulses: 2+ and symmetric all extremities Skin: Skin color, texture, turgor normal, no rashes or lesions  NEUROLOGIC:   Mental status: Alert and oriented x4,  no aphasia, good attention span, fund of knowledge, and memory Motor Exam - grossly normal Sensory Exam - grossly normal Reflexes: 1+ Coordination - grossly normal Gait - grossly  normal Balance - grossly normal Cranial Nerves: I: smell Not tested  II: visual acuity  OS: nl    OD: nl  II: visual fields Full to confrontation  II: pupils Equal, round, reactive to light  III,VII: ptosis None  III,IV,VI: extraocular muscles  Full ROM  V: mastication Normal  V: facial light touch sensation  Normal  V,VII: corneal reflex  Present  VII: facial muscle function - upper  Normal  VII: facial muscle function - lower Normal  VIII: hearing Not tested  IX: soft palate elevation  Normal  IX,X: gag reflex Present  XI: trapezius strength  5/5  XI: sternocleidomastoid strength 5/5  XI: neck flexion strength  5/5  XII: tongue strength  Normal    Data Review Lab Results  Component Value Date   WBC 8.3 06/10/2015   HGB 14.3 06/10/2015   HCT 44.3 06/10/2015   MCV 87.9 06/10/2015   PLT 235 06/10/2015   Lab Results  Component Value Date   NA 140 06/10/2015   K 4.6 06/10/2015   CL 102 06/10/2015   CO2 27 06/10/2015   BUN 14 06/10/2015   CREATININE 0.87 06/10/2015   GLUCOSE 110* 06/10/2015   Lab Results  Component Value Date   INR 0.95 06/10/2015    Assessment/Plan: Patient admitted for lumbar decompression and instrumented fusion for spinal stenosis and instability. Patient has failed a reasonable attempt at conservative therapy.  I explained the condition and procedure to the patient and answered any questions.  Patient wishes to proceed with procedure as planned. Understands risks/ benefits and typical outcomes of procedure.   Kenae Lindquist,Price S 06/17/2015 11:16 AM

## 2015-06-17 NOTE — Anesthesia Procedure Notes (Signed)
Procedure Name: Intubation Date/Time: 06/17/2015 11:28 AM Performed by: Charm Barges, Kyce R Pre-anesthesia Checklist: Patient identified, Emergency Drugs available, Suction available, Patient being monitored and Timeout performed Patient Re-evaluated:Patient Re-evaluated prior to inductionOxygen Delivery Method: Circle system utilized Preoxygenation: Pre-oxygenation with 100% oxygen Intubation Type: IV induction Ventilation: Mask ventilation without difficulty Laryngoscope Size: Mac and 4 Grade View: Grade II Tube type: Oral Tube size: 8.0 mm Number of attempts: 1 Airway Equipment and Method: Stylet Placement Confirmation: ETT inserted through vocal cords under direct vision,  positive ETCO2 and breath sounds checked- equal and bilateral Secured at: 23 cm Tube secured with: Tape Dental Injury: Teeth and Oropharynx as per pre-operative assessment

## 2015-06-18 ENCOUNTER — Encounter (HOSPITAL_COMMUNITY): Payer: Self-pay | Admitting: Neurological Surgery

## 2015-06-18 MED ORDER — OXYCODONE HCL 5 MG PO TABS
10.0000 mg | ORAL_TABLET | ORAL | Status: DC | PRN
  Administered 2015-06-18 – 2015-06-20 (×6): 10 mg via ORAL
  Filled 2015-06-18 (×6): qty 2

## 2015-06-18 NOTE — Care Management Note (Signed)
Case Management Note  Patient Details  Name: Pearson GrippeDavid L Siegman MRN: [id number] Date of Birth: [birth year]  Subjective/Objective:                    Action/Plan: Patient was admitted for a lumbar laminectomy with fusion. Lives at home with spouse. Will follow for discharge needs pending PT/OT evals and physician orders.  Expected Discharge Date:                  Expected Discharge Plan:     In-House Referral:     Discharge planning Services     Post Acute Care Choice:    Choice offered to:     DME Arranged:    DME Agency:     HH Arranged:    HH Agency:     Status of Service:  In process, will continue to follow  Medicare Important Message Given:    Date Medicare IM Given:    Medicare IM give by:    Date Additional Medicare IM Given:    Additional Medicare Important Message give by:     If discussed at Long Length of Stay Meetings, dates discussed:    Additional CommentsAnda Kraft:  Tarika Mckethan C, RN 06/18/2015, 12:16 PM [phone]

## 2015-06-18 NOTE — Progress Notes (Signed)
Patient does not want foley removed at this time. He wants his pain to be under control so he can stand up to void.

## 2015-06-18 NOTE — Progress Notes (Signed)
Patient ID: [name], male   DOB: [birth year], 60 y.o.   MRN: [id number] Subjective: Patient reports back soreness,no leg pain or NTW  Objective: Vital signs in last 24 hours: Temp:  [97.4 F (36.3 C)-99.1 F (37.3 C)] 98.8 F (37.1 C) (03/03 0609) Pulse Rate:  [77-102] 91 (03/03 0609) Resp:  [14-20] 18 (03/03 0809) BP: (116-154)/(64-92) 116/64 mmHg (03/03 0609) SpO2:  [6 %-99 %] 98 % (03/03 0809) FiO2 (%):  [0 %] 0 % (03/03 0809) Weight:  [136.986 kg (302 lb)] 136.986 kg (302 lb) (03/02 1049)  Intake/Output from previous day: 03/02 0701 - 03/03 0700 In: 3113.9 [I.V.:2563.9; Blood:200; IV Piggyback:350] Out: 2515 [Urine:1440; Drains:375; Blood:700] Intake/Output this shift:    Neurologic: Grossly normal  Lab Results: Lab Results  Component Value Date   WBC 8.3 06/10/2015   HGB 14.3 06/10/2015   HCT 44.3 06/10/2015   MCV 87.9 06/10/2015   PLT 235 06/10/2015   Lab Results  Component Value Date   INR 0.95 06/10/2015   BMET Lab Results  Component Value Date   NA 140 06/10/2015   K 4.6 06/10/2015   CL 102 06/10/2015   CO2 27 06/10/2015   GLUCOSE 110* 06/10/2015   BUN 14 06/10/2015   CREATININE 0.87 06/10/2015   CALCIUM 9.6 06/10/2015    Studies/Results: Dg Lumbar Spine 2-3 Views  06/17/2015  CLINICAL DATA:  Operative imaging of the lumbar spine during L4-L5 posterior fusion. 128.9 seconds of fluoroscopy. EXAM: LUMBAR SPINE - 2-3 VIEW; DG C-ARM 61-120 MIN COMPARISON:  None. FINDINGS: Two submitted images show bilateral pedicle screws interconnecting rods at the L4-L5 levels, well-positioned in well-seated. There is a well-centered radiolucent disc spacer partly maintaining good L4-L5 disc space height. IMPRESSION: Operative imaging for L4-L5 posterior lumbar fusion as detailed. Electronically Signed   By: Amie Portlandavid  Nunez M.D.   On: 06/17/2015 16:08   Dg C-arm 61-120 Min  06/17/2015  CLINICAL DATA:  Operative imaging of the lumbar spine during L4-L5 posterior fusion.  128.9 seconds of fluoroscopy. EXAM: LUMBAR SPINE - 2-3 VIEW; DG C-ARM 61-120 MIN COMPARISON:  None. FINDINGS: Two submitted images show bilateral pedicle screws interconnecting rods at the L4-L5 levels, well-positioned in well-seated. There is a well-centered radiolucent disc spacer partly maintaining good L4-L5 disc space height. IMPRESSION: Operative imaging for L4-L5 posterior lumbar fusion as detailed. Electronically Signed   By: Amie Portlandavid  Nunez M.D.   On: 06/17/2015 16:08    Assessment/Plan: Very sore, mobilize as tolerated   LOS: 1 day    [name],[name] 06/18/2015, 9:55 AM

## 2015-06-18 NOTE — Evaluation (Signed)
Physical Therapy Evaluation Patient Details Name: Steven GrippeDavid L Nunez MRN: [id number] DOB: 1955-07-27 Today's Date: 06/18/2015   History of Present Illness  Pt is a 60 y/o male who presents s/p L4-L5 PLIF on 06/17/15.  Clinical Impression  Pt admitted with above diagnosis. Pt currently with functional limitations due to the deficits listed below (see PT Problem List). At the time of PT eval pt was able to perform transfers and minimal ambulation with min guard to min assist for balance support and safety. Pt very anxious about mobility and required increased time. Pt will benefit from skilled PT to increase their independence and safety with mobility to allow discharge to the venue listed below.       Follow Up Recommendations Outpatient PT;Supervision for mobility/OOB    Equipment Recommendations  Rolling walker with 5" wheels;3in1 (PT)    Recommendations for Other Services       Precautions / Restrictions Precautions Precautions: Fall;Back Precaution Booklet Issued: No Precaution Comments: Reviewed precautions verbally however pt would benefit from review of handout Required Braces or Orthoses: Spinal Brace Spinal Brace: Lumbar corset;Applied in sitting position Restrictions Weight Bearing Restrictions: No      Mobility  Bed Mobility Overal bed mobility: Needs Assistance Bed Mobility: Rolling;Sidelying to Sit Rolling: Supervision Sidelying to sit: Min assist       General bed mobility comments: Assist for trunk elevation to full sitting position. Step-by-step cues provided for log roll technique.  Transfers Overall transfer level: Needs assistance Equipment used: Rolling walker (2 wheeled) Transfers: Sit to/from Stand Sit to Stand: Min guard;From elevated surface         General transfer comment: HOB elevated for comfort. Pt was able to power-up to full standing position without assistance, however hands-on support was provided for safety.    Ambulation/Gait Ambulation/Gait assistance: Min guard Ambulation Distance (Feet): 5 Feet Assistive device: Rolling walker (2 wheeled) Gait Pattern/deviations: Step-through pattern;Decreased stride length;Trunk flexed Gait velocity: Decreased Gait velocity interpretation: Below normal speed for age/gender General Gait Details: VC's for improved posture. Pt was able to take a few steps from bed to chair without physical assistance, however hands-on guarding was provided for safety. Pt declined further gait training due to pain.   Stairs            Wheelchair Mobility    Modified Rankin (Stroke Patients Only)       Balance Overall balance assessment: Needs assistance Sitting-balance support: Feet supported;No upper extremity supported Sitting balance-Leahy Scale: Fair     Standing balance support: Bilateral upper extremity supported;During functional activity Standing balance-Leahy Scale: Poor                               Pertinent Vitals/Pain Pain Assessment: 0-10 Pain Score: 8  Pain Location: Back Pain Descriptors / Indicators: Operative site guarding;Aching Pain Intervention(s): Limited activity within patient's tolerance;Monitored during session;Repositioned    Home Living Family/patient expects to be discharged to:: Private residence Living Arrangements: Spouse/significant other;Children Available Help at Discharge: Family;Available 24 hours/day Type of Home: House Home Access: Stairs to enter Entrance Stairs-Rails: Left Entrance Stairs-Number of Steps: 3 Home Layout: One level Home Equipment: Toilet riser;Shower seat;Crutches;Walker - standard;Cane - quad;Cane - single point      Prior Function Level of Independence: Independent               Hand Dominance   Dominant Hand: Right    Extremity/Trunk Assessment   Upper Extremity Assessment: Overall  WFL for tasks assessed           Lower Extremity Assessment: Generalized  weakness      Cervical / Trunk Assessment: Normal  Communication   Communication: No difficulties  Cognition Arousal/Alertness: Awake/alert Behavior During Therapy: WFL for tasks assessed/performed Overall Cognitive Status: Within Functional Limits for tasks assessed                      General Comments      Exercises        Assessment/Plan    PT Assessment Patient needs continued PT services  PT Diagnosis Difficulty walking;Acute pain   PT Problem List Decreased strength;Decreased range of motion;Decreased activity tolerance;Decreased balance;Decreased mobility;Decreased knowledge of use of DME;Decreased safety awareness;Decreased knowledge of precautions;Pain  PT Treatment Interventions DME instruction;Gait training;Stair training;Functional mobility training;Therapeutic activities;Therapeutic exercise;Neuromuscular re-education;Patient/family education   PT Goals (Current goals can be found in the Care Plan section) Acute Rehab PT Goals Patient Stated Goal: Return home PT Goal Formulation: With patient Time For Goal Achievement: 06/25/15 Potential to Achieve Goals: Good    Frequency Min 5X/week   Barriers to discharge        Co-evaluation               End of Session Equipment Utilized During Treatment: Back brace Activity Tolerance: Patient limited by pain Patient left: in chair;with call bell/phone within reach;with chair alarm set Nurse Communication: Mobility status         Time: 7829-5621 PT Time Calculation (min) (ACUTE ONLY): 40 min   Charges:   PT Evaluation $PT Eval Moderate Complexity: 1 Procedure PT Treatments $Gait Training: 23-37 mins   PT G Codes:        Conni Slipper 06/26/15, 12:58 PM   Conni Slipper, PT, DPT Acute Rehabilitation Services Pager: [phone]

## 2015-06-18 NOTE — Progress Notes (Signed)
Occupational Therapy Evaluation Patient Details Name: [name] MRN: [id number] DOB: 03-05-1956 Today's Date: 06/18/2015    History of Present Illness Pt is a 60 y/o male who presents s/p L4-L5 PLIF on 06/17/15.   Clinical Impression   PTA, Pt independent with ADL and mobility. Pt progressing well this pm. Ambulated @ 150 ft with minguard A @ RW level. Began education regarding use of compensatory strategies/AE and DME to maximize independence with ADL and functional mobility for ADL. Pt checking to see if he can borrow a RW. Has other DME. Will follow acutely to address ADL and facilitate safe D/C home with initial 24/7 S by wife.  Encouraged pt to ambulate with nsg this pm using RW.     Follow Up Recommendations  No OT follow up;Supervision/Assistance - 24 hour (initially)    Equipment Recommendations  Other (comment) (RW - wide - pt over 300#)    Recommendations for Other Services       Precautions / Restrictions Precautions Precautions: Fall;Back Precaution Booklet Issued: No Precaution Comments: Reviewed precautions verbally however pt would benefit from review of handout Required Braces or Orthoses: Spinal Brace Spinal Brace: Lumbar corset;Applied in sitting position Restrictions Weight Bearing Restrictions: No      Mobility Bed Mobility Overal bed mobility: Needs Assistance Bed Mobility: Rolling;Sit to Sidelying Rolling: Supervision (educated on changing positions in side lying)       Sit to sidelying: Min assist General bed mobility comments: assist to help lift RLE onto bed  Transfers Overall transfer level: Needs assistance Equipment used: Rolling walker (2 wheeled) Transfers: Sit to/from Stand Sit to Stand: Min guard         General transfer comment: from recliner. vc to scoot hips to EOB    Balance Overall balance assessment: Needs assistance   Sitting balance-Leahy Scale: Fair       Standing balance-Leahy Scale: Poor                               ADL Overall ADL's : Needs assistance/impaired     Grooming: Set up;Supervision/safety;Sitting   Upper Body Bathing: Sitting;Supervision/ safety;Set up   Lower Body Bathing: Moderate assistance;Sit to/from stand   Upper Body Dressing : Minimal assistance (donning brace)   Lower Body Dressing: Moderate assistance;Sit to/from stand   Toilet Transfer: Minimal assistance   Toileting- Clothing Manipulation and Hygiene: Moderate assistance       Functional mobility during ADLs: Minimal assistance;Rolling walker;Cueing for safety General ADL Comments: Began educating pt regarding compensatory techniques for ADL. Pt has reacher at home. Will educate pt on useof AE for LB ADL. Educated on use of tongs to assist with hygiene after toileting.  wife will be off work for 1 week.                      Pertinent Vitals/Pain Pain Assessment: 0-10 Pain Score: 7  Pain Location: back Pain Descriptors / Indicators: Aching;Discomfort;Operative site guarding Pain Intervention(s): Limited activity within patient's tolerance;Repositioned     Hand Dominance Right   Extremity/Trunk Assessment         Cervical / Trunk Assessment Cervical / Trunk Assessment: Normal   Communication Communication Communication: No difficulties   Cognition Arousal/Alertness: Awake/alert Behavior During Therapy: WFL for tasks assessed/performed Overall Cognitive Status: Within Functional Limits for tasks assessed  General Comments   Pt very appreciative. "I never thought I would be doing this today"    Exercises       Shoulder Instructions      Home Living Family/patient expects to be discharged to:: Private residence Living Arrangements: Spouse/significant other;Children Available Help at Discharge: Family;Available 24 hours/day Type of Home: House Home Access: Stairs to enter [employer] of Steps: 3 Entrance Stairs-Rails: Left Home  Layout: One level     Bathroom Shower/Tub: Chief Strategy Officer: Handicapped height Bathroom Accessibility: Yes How Accessible: Accessible via walker Home Equipment: Toilet riser;Shower seat;Crutches;Walker - standard;Cane - quad;Cane - single point          Prior Functioning/Environment Level of Independence: Independent             OT Diagnosis: Generalized weakness;Acute pain   OT Problem List: Decreased strength;Decreased activity tolerance;Impaired balance (sitting and/or standing);Decreased safety awareness;Decreased knowledge of use of DME or AE;Decreased knowledge of precautions;Obesity;Pain   OT Treatment/Interventions: Self-care/ADL training;DME and/or AE instruction;Therapeutic activities;Patient/family education;Balance training    OT Goals(Current goals can be found in the care plan section) Acute Rehab OT Goals Patient Stated Goal: Return home OT Goal Formulation: With patient Time For Goal Achievement: 06/25/15 Potential to Achieve Goals: Good ADL Goals Pt Will Perform Lower Body Bathing: with min assist;with caregiver independent in assisting;with adaptive equipment;sit to/from stand Pt Will Perform Lower Body Dressing: with min assist;with adaptive equipment;sit to/from stand;with caregiver independent in assisting Pt Will Perform Toileting - Clothing Manipulation and hygiene: with min assist;with caregiver independent in assisting;with adaptive equipment;sitting/lateral leans Pt Will Perform Tub/Shower Transfer: with min guard assist;ambulating;tub bench;rolling walker;with caregiver independent in assisting Additional ADL Goal #1: Pt will independently verbalize 3/3 back precautions for ADL  OT Frequency: Min 3X/week   Barriers to D/C:            Co-evaluation              End of Session Equipment Utilized During Treatment: Gait belt;Rolling walker;Back brace Nurse Communication: Mobility status;Precautions;Other (comment) (need to  ambulate this pm)  Activity Tolerance: Patient tolerated treatment well Patient left: in bed;with call bell/phone within reach;with SCD's reapplied   Time: 1415-1445 OT Time Calculation (min): 30 min Charges:  OT General Charges $OT Visit: 1 Procedure OT Evaluation $OT Eval Moderate Complexity: 1 Procedure OT Treatments $Self Care/Home Management : 8-22 mins G-Codes:    Sheritta Deeg,HILLARY 06-23-2015, 3:01 PM   [facility], OTR/L  [phone] 06/23/2015

## 2015-06-19 NOTE — Progress Notes (Signed)
Physical Therapy Treatment Patient Details Name: [name] MRN: [id number] DOB: 1955-10-13 Today's Date: 06/19/2015    History of Present Illness Pt is a 60 y/o male who presents s/p L4-L5 PLIF on 06/17/15.    PT Comments    Pt progressing well with mobility, much improved from yesterday. Patient needs to practice stairs next session.    Follow Up Recommendations  Outpatient PT;Supervision for mobility/OOB     Equipment Recommendations  Rolling walker with 5" wheels;3in1 (PT)    Recommendations for Other Services       Precautions / Restrictions Precautions Precautions: Fall;Back Precaution Booklet Issued: Yes (comment) Precaution Comments: Able to state 3/3 back precautions without cueing Required Braces or Orthoses: Spinal Brace Spinal Brace: Lumbar corset;Applied in sitting position Restrictions Weight Bearing Restrictions: No    Mobility  Bed Mobility               General bed mobility comments: pt up in recliner. Did not want to return to bed  Transfers Overall transfer level: Needs assistance Equipment used: Rolling walker (2 wheeled) Transfers: Sit to/from Stand Sit to Stand: Supervision Stand pivot transfers: Supervision       General transfer comment: Peformed with safe technique  Ambulation/Gait Ambulation/Gait assistance: Supervision Ambulation Distance (Feet): 140 Feet Assistive device: Rolling walker (2 wheeled) Gait Pattern/deviations: Step-through pattern Gait velocity: Decreased   General Gait Details: Much improved gait from yesterday   Stairs            Wheelchair Mobility    Modified Rankin (Stroke Patients Only)       Balance                                    Cognition Arousal/Alertness: Awake/alert Behavior During Therapy: WFL for tasks assessed/performed Overall Cognitive Status: Within Functional Limits for tasks assessed                      Exercises      General Comments  General comments (skin integrity, edema, etc.): No family present.       Pertinent Vitals/Pain Pain Assessment: 0-10 Pain Score: 4  Pain Location: back Pain Intervention(s): Limited activity within patient's tolerance;Monitored during session    Home Living                      Prior Function            PT Goals (current goals can now be found in the care plan section) Acute Rehab PT Goals Patient Stated Goal: Return home Progress towards PT goals: Progressing toward goals    Frequency  Min 5X/week    PT Plan Current plan remains appropriate    Co-evaluation             End of Session Equipment Utilized During Treatment: Back brace;Gait belt Activity Tolerance: Patient tolerated treatment well Patient left: in chair;with call bell/phone within reach Per COTA who discussed with RN it is ok to leave chair alarm off.      Time: 4098-11910958-1012 PT Time Calculation (min) (ACUTE ONLY): 14 min  Charges:  $Gait Training: 8-22 mins                    G Codes:      Donnella ShamSawulski, Falecia Vannatter J 06/19/2015, 10:19 AM  Lavona MoundMark Yaroslav Gombos, PT  [phone] 06/19/2015

## 2015-06-19 NOTE — Progress Notes (Signed)
Occupational Therapy Treatment Patient Details Name: [name] MRN: [id number] DOB: 1955/05/22 Today's Date: 06/19/2015    History of present illness Pt is a 61 y/o male who presents s/p L4-L5 PLIF on 06/17/15.   OT comments  Reviewed all education and precautions with pt.  Has hx. Of multiple sx. Declining need for review of most acute stated goals.  Reports wife available to assist as needed with LB ADLS.  Will alert OTR/L to sign off.    Follow Up Recommendations  No OT follow up;Supervision/Assistance - 24 hour    Equipment Recommendations  Other (comment)    Recommendations for Other Services      Precautions / Restrictions Precautions Precautions: Fall;Back Required Braces or Orthoses: Spinal Brace Spinal Brace: Lumbar corset;Applied in sitting position       Mobility Bed Mobility               General bed mobility comments: in recliner upon arrival into room  Transfers Overall transfer level: Needs assistance Equipment used: Rolling walker (2 wheeled) Transfers: Sit to/from [facility] Sit to Stand: Supervision Stand pivot transfers: Supervision            Balance                                   ADL Overall ADL's : Needs assistance/impaired     Grooming: Wash/dry hands;Standing;Modified independent (applied contact lenses while in standing)         Lower Body Bathing Details (indicate cue type and reason): declined need for return demo or review, sites hx of multiple sx. and states wife will assist as needed       Lower Body Dressing Details (indicate cue type and reason): declined need for return demo or review, sites hx of multiple sx. and states wife will assist as needed   Toilet Transfer Details (indicate cue type and reason): pt. with current scrotum issues, is having to stand and use male urinal, declined need for review.  Reviewed techniques for peri-care after BM.  Able to reach with minimal twist.   States his wife can help if needed.         Tub/Shower Transfer Details (indicate cue type and reason): declined tub transfer, has a bench with a back and sits and slides across, again siting multiple sx. and does not need review Functional mobility during ADLs: Modified independent;Supervision/safety;Rolling walker General ADL Comments: pt. required liittle education and review secondary to hx. of multiple sx. and states he has all DME except rw.  eager for d/c home      Vision                     Perception     Praxis      Cognition   Behavior During Therapy: [facility] for tasks assessed/performed Overall Cognitive Status: Within Functional Limits for tasks assessed                       Extremity/Trunk Assessment               Exercises     Shoulder Instructions       General Comments      Pertinent Vitals/ Pain       Pain Assessment: No/denies pain  Home Living  Prior Functioning/Environment              Frequency Min 3X/week     Progress Toward Goals  OT Goals(current goals can now be found in the care plan section)  Progress towards OT goals: Goals met/education completed, patient discharged from [place] Discharge plan remains appropriate    Co-evaluation                 End of Session Equipment Utilized During Treatment: Gait belt;Rolling walker;Back brace   Activity Tolerance Patient tolerated treatment well   Patient Left in chair;with call bell/phone within reach   Nurse Communication Other (comment) (pt. wants clearance to stand without assistance for urinal use)        Time: 1915-5027 OT Time Calculation (min): 20 min  Charges: OT General Charges $OT Visit: 1 Procedure OT Treatments $Self Care/Home Management : 8-22 mins  Janice Coffin, COTA/L 06/19/2015, 9:49 AM

## 2015-06-19 NOTE — Plan of Care (Signed)
Problem: Acute Rehab OT Goals (only OT should resolve) Goal: Pt. Will Perform Lower Body Bathing Outcome: Not Applicable Date Met:  68/59/92 Reports wife will assist Goal: Pt. Will Perform Lower Body Dressing Outcome: Not Applicable Date Met:  34/14/43 Reports wife will assist Goal: Pt. Will Perform Tub/Shower Transfer Outcome: Not Met (add Reason) Pt. Declined need for this goal as he has DME at home from previous sx. And did not need the review

## 2015-06-19 NOTE — Progress Notes (Signed)
No issues overnight. Pt reports improvement in pain. Ambulating well.  EXAM:  BP 123/70 mmHg  Pulse 83  Temp(Src) 99.8 F (37.7 C) (Oral)  Resp 17  Ht 5\' 11"  (1.803 m)  Wt 136.986 kg (302 lb)  BMI 42.14 kg/m2  SpO2 93%  Awake, alert, oriented  Speech fluent, appropriate  CN grossly intact  5/5 BUE/BLE  Wound c/d/i  IMPRESSION:  60 y.o. male POD# 2 s/p fusion, doing well  PLAN: - Will d/c PCA today - If pain well controlled on orals, likely home tomorrow

## 2015-06-20 LAB — GLUCOSE, CAPILLARY
GLUCOSE-CAPILLARY: 115 mg/dL — AB (ref 65–99)
GLUCOSE-CAPILLARY: 120 mg/dL — AB (ref 65–99)

## 2015-06-20 MED ORDER — OXYCODONE-ACETAMINOPHEN 10-325 MG PO TABS: 1.0000 | ORAL_TABLET | Freq: Four times a day (QID) | ORAL | Status: DC | PRN

## 2015-06-20 NOTE — Progress Notes (Signed)
17 mg PCA morphine 1mg /mL wasted in sharps with Ashok CordiaMarissa, RN

## 2015-06-20 NOTE — Progress Notes (Signed)
Discharge orders received. RN discussed discharge instructions including wound care, follow up appointment, discharge medications, directions. Patient and wife vocalized understanding of both. Neuro assessment is unchanged. Pt states he has rolling walker and 3 in 1 at home, refuses our equipment. IV removed. Patient's wife is getting patient's belongings together, to call when ready for wheelchair escort to car.

## 2015-06-20 NOTE — Care Management Note (Signed)
Case Management Note  Patient Details  Name: Pearson GrippeDavid L Purdom MRN: [id number] Date of Birth: [birth year]  Subjective/Objective:                  lumbar laminectomy L4-5 with fusion  Action/Plan: CM spoke to patient and spouse at the bedside. CM offered to set patient up for outpatient PT and patient said [facility] and would prefer to call on his own after his followup with surgeon in 1.5 weeks. CM spoke to patient about DME needs and he said that he has a 3N1 and walker at home. He said it is not a RW but does not feel he will need to use it extensively. CM offered to order RW for him to deliver to bedside and he declined and said that if he changes his mind, he knows where [facility] agency is and can pick one up. Spouse to be with patient at home to assist. No further CM needs communicated at this time. CM remains available should additional needs arise.   Expected Discharge Date:  06/20/15              Expected Discharge Plan:  Home/Self Care  In-House Referral:     Discharge planning Services  CM Consult  Post Acute Care Choice:    Choice offered to:  Patient  DME Arranged:    DME Agency:     HH Arranged:    HH Agency:     Status of Service:  Completed, signed off  Medicare Important Message Given:    Date Medicare IM Given:    Medicare IM give by:    Date Additional Medicare IM Given:    Additional Medicare Important Message give by:     If discussed at Long Length of Stay Meetings, dates discussed:    Additional Comments:  Darcel SmallingAnna C Kentrel Clevenger, RN 06/20/2015, 10:17 AM

## 2015-06-20 NOTE — Discharge Summary (Signed)
Physician Discharge Summary  Patient ID: [name] MRN: [id number] DOB/AGE: 11-13-1955 60 y.o.  Admit date: 06/17/2015 Discharge date: 06/20/2015  Admission Diagnoses:Lumbar spinal stenosis L4-5 with grade 1 dynamic spondylolisthesis, 2. Spinal stenosis L3-4, 3. Spinal stenosis L1 -2, 4. Back and leg pain    Discharge Diagnoses: Lumbar spinal stenosis L4-5 with grade 1 dynamic spondylolisthesis, 2. Spinal stenosis L3-4, 3. Spinal stenosis L1 -2, 4. Back and leg pain   Active Problems:   S/P lumbar spinal fusion   Discharged Condition: good  Hospital Course: [name] was admitted and taken to the operating room for an uncomplicated lumbar fusion. Post op he has done very well. His wound is clean, dry, and without signs of infection. He is ambulating, voiding, and tolerating a regular diet without problems.   Consults: None  Significant Diagnostic Studies: none  Treatments: surgery: Decompressive lumbar laminectomy L4-5 requiring more work than would be required for a simple exposure of the disk for PLIF in order to adequately decompress the neural elements and address the spinal stenosis ; decompressive lumbar laminectomy, medial facetectomy and foraminotomy bilaterally L1-2 and L3-4, note these levels are separate from the plif level 2. Posterior lumbar interbody fusion L4-5 using PEEK interbody cages packed with morcellized allograft and autograft 3. Posterior fixation L4-5 using cortical pedicle screws.   4. Intertransverse arthrodesis L45 using morcellized autograft and allograft.   Discharge Exam: Blood pressure 133/85, pulse 79, temperature 98.4 F (36.9 C), temperature source Oral, resp. rate 16, height 5\' 11"  (1.803 m), weight 302 lb (136.986 kg), SpO2 99 %. General appearance: alert, cooperative, appears stated age and no distress Neurologic: Alert and oriented X 3, normal strength and tone. Normal symmetric reflexes. Normal coordination and gait  Disposition:  01-Home or Self Care     Medication List    TAKE these medications        aspirin 325 MG tablet  Take 325 mg by mouth daily.     atorvastatin 40 MG tablet  Commonly known as:  LIPITOR  Take 40 mg by mouth daily.     co-enzyme Q-10 30 MG capsule  Take 30 mg by mouth daily.     diphenhydramine-acetaminophen 25-500 MG Tabs tablet  Commonly known as:  TYLENOL PM  Take 2 tablets by mouth at bedtime as needed.     docusate sodium 100 MG capsule  Commonly known as:  COLACE  Take 100 mg by mouth 2 (two) times daily.     ketoprofen 75 MG capsule  Commonly known as:  ORUDIS  Take 75 mg by mouth 2 (two) times daily with a meal.     losartan 25 MG tablet  Commonly known as:  COZAAR  Take 25 mg by mouth daily.     methocarbamol 750 MG tablet  Commonly known as:  ROBAXIN  Take 750 mg by mouth every 8 (eight) hours.     morphine 30 MG 12 hr tablet  Commonly known as:  MS CONTIN  Take 30 mg by mouth 2 (two) times daily.     multivitamins ther. w/minerals Tabs tablet  Take 1 tablet by mouth daily.     oxyCODONE-acetaminophen 10-325 MG tablet  Commonly known as:  PERCOCET  Take 1 tablet by mouth every 6 (six) hours as needed for pain.     sildenafil 100 MG tablet  Commonly known as:  VIAGRA  Take 100 mg by mouth as needed for erectile dysfunction.     VITAMIN D-3 PO  Take 2,000  Units by mouth daily.         Signed: Pamlea Finder L 06/20/2015, 9:56 AM

## 2015-06-22 MED FILL — Heparin Sodium (Porcine) Inj 1000 Unit/ML: INTRAMUSCULAR | Qty: 30 | Status: AC

## 2015-06-22 MED FILL — Sodium Chloride IV Soln 0.9%: INTRAVENOUS | Qty: 2000 | Status: AC

## 2015-06-23 NOTE — Anesthesia Postprocedure Evaluation (Signed)
Anesthesia Post Note  Patient: Pearson GrippeDavid L Lantzy  Procedure(s) Performed: Procedure(s) (LRB): LUMBAR FOUR-LUMBAR FIVE MAXIMUM ACCESS (MAS) POSTERIOR LUMBAR INTERBODY FUSION (PLIF) WITH LUMBAR ONE-TWO, LUMBAR FOUR-FIVE LAMINECTOMY  (N/A)  Patient location during evaluation: PACU Anesthesia Type: General Level of consciousness: awake and alert Pain management: pain level controlled Vital Signs Assessment: post-procedure vital signs reviewed and stable Respiratory status: spontaneous breathing, nonlabored ventilation, respiratory function stable and patient connected to nasal cannula oxygen Cardiovascular status: blood pressure returned to baseline and stable Postop Assessment: no signs of nausea or vomiting Anesthetic complications: no    Last Vitals:  Filed Vitals:   06/20/15 0504 06/20/15 0653  BP: 133/85   Pulse: 79   Temp: 36.9 C 36.9 C  Resp: 16     Last Pain:  Filed Vitals:   06/20/15 1003  PainSc: 4                  Kennieth RadFitzgerald, Rana Adorno E

## 2015-07-28 ENCOUNTER — Other Ambulatory Visit: Payer: Self-pay | Admitting: Urology

## 2015-08-16 ENCOUNTER — Encounter (HOSPITAL_BASED_OUTPATIENT_CLINIC_OR_DEPARTMENT_OTHER): Payer: Self-pay | Admitting: *Deleted

## 2015-08-16 NOTE — Progress Notes (Signed)
NPO AFTER MN.  ARRIVE AT 0700.  NEEDS ISTAT.  CURRENT IN CHART AND EPIC.  WILL TAKE AM DOS LOSARTAN AND LIPITOR W/ SIPS OF WATER.

## 2015-08-25 ENCOUNTER — Encounter (HOSPITAL_BASED_OUTPATIENT_CLINIC_OR_DEPARTMENT_OTHER)
Admission: RE | Disposition: A | Discharge status: Home / Self Care | Payer: Self-pay | Source: Ambulatory Visit | Attending: Urology

## 2015-08-25 ENCOUNTER — Ambulatory Visit (HOSPITAL_BASED_OUTPATIENT_CLINIC_OR_DEPARTMENT_OTHER)
Admission: RE | Admit: 2015-08-25 | Discharge: 2015-08-25 | Disposition: A | Discharge status: Home / Self Care | Payer: BLUE CROSS/BLUE SHIELD | Source: Ambulatory Visit | Attending: Urology | Admitting: Urology

## 2015-08-25 ENCOUNTER — Encounter (HOSPITAL_BASED_OUTPATIENT_CLINIC_OR_DEPARTMENT_OTHER): Payer: Self-pay | Admitting: *Deleted

## 2015-08-25 ENCOUNTER — Ambulatory Visit (HOSPITAL_BASED_OUTPATIENT_CLINIC_OR_DEPARTMENT_OTHER): Payer: BLUE CROSS/BLUE SHIELD | Admitting: Anesthesiology

## 2015-08-25 DIAGNOSIS — N433 Hydrocele, unspecified: Secondary | ICD-10-CM | POA: Diagnosis not present

## 2015-08-25 DIAGNOSIS — G4730 Sleep apnea, unspecified: Secondary | ICD-10-CM | POA: Insufficient documentation

## 2015-08-25 DIAGNOSIS — N4341 Spermatocele of epididymis, single: Secondary | ICD-10-CM | POA: Diagnosis not present

## 2015-08-25 DIAGNOSIS — Z6841 Body Mass Index (BMI) 40.0 and over, adult: Secondary | ICD-10-CM | POA: Insufficient documentation

## 2015-08-25 DIAGNOSIS — I10 Essential (primary) hypertension: Secondary | ICD-10-CM | POA: Insufficient documentation

## 2015-08-25 DIAGNOSIS — Z79899 Other long term (current) drug therapy: Secondary | ICD-10-CM | POA: Diagnosis not present

## 2015-08-25 DIAGNOSIS — N5089 Other specified disorders of the male genital organs: Secondary | ICD-10-CM | POA: Diagnosis present

## 2015-08-25 HISTORY — PX: SPERMATOCELECTOMY: SHX2420

## 2015-08-25 HISTORY — PX: SCROTAL EXPLORATION: SHX2386

## 2015-08-25 HISTORY — DX: Hydrocele, unspecified: N43.3

## 2015-08-25 HISTORY — DX: Presence of spectacles and contact lenses: Z97.3

## 2015-08-25 HISTORY — PX: HYDROCELE EXCISION: SHX482

## 2015-08-25 LAB — POCT I-STAT, CHEM 8
BUN: 19 mg/dL (ref 6–20)
CALCIUM ION: 1.15 mmol/L (ref 1.13–1.30)
Chloride: 101 mmol/L (ref 101–111)
Creatinine, Ser: 0.8 mg/dL (ref 0.61–1.24)
GLUCOSE: 103 mg/dL — AB (ref 65–99)
HCT: 43 % (ref 39.0–52.0)
HEMOGLOBIN: 14.6 g/dL (ref 13.0–17.0)
Potassium: 3.9 mmol/L (ref 3.5–5.1)
Sodium: 141 mmol/L (ref 135–145)
TCO2: 28 mmol/L (ref 0–100)

## 2015-08-25 SURGERY — EXPLORATION, SCROTUM
Anesthesia: General | Laterality: Right

## 2015-08-25 MED ORDER — FENTANYL CITRATE (PF) 100 MCG/2ML IJ SOLN
INTRAMUSCULAR | Status: AC
  Filled 2015-08-25: qty 2

## 2015-08-25 MED ORDER — LIDOCAINE HCL (CARDIAC) 20 MG/ML IV SOLN
INTRAVENOUS | Status: DC | PRN
  Administered 2015-08-25: 100 mg via INTRAVENOUS

## 2015-08-25 MED ORDER — BUPIVACAINE HCL (PF) 0.25 % IJ SOLN
INTRAMUSCULAR | Status: DC | PRN
  Administered 2015-08-25: 5 mL

## 2015-08-25 MED ORDER — ONDANSETRON HCL 4 MG/2ML IJ SOLN
INTRAMUSCULAR | Status: DC | PRN
  Administered 2015-08-25: 4 mg via INTRAVENOUS

## 2015-08-25 MED ORDER — CEFAZOLIN SODIUM-DEXTROSE 2-4 GM/100ML-% IV SOLN
2.0000 g | INTRAVENOUS | Status: DC
  Filled 2015-08-25: qty 100

## 2015-08-25 MED ORDER — MIDAZOLAM HCL 5 MG/5ML IJ SOLN
INTRAMUSCULAR | Status: DC | PRN
  Administered 2015-08-25: 2 mg via INTRAVENOUS

## 2015-08-25 MED ORDER — DEXTROSE 5 % IV SOLN
3.0000 g | INTRAVENOUS | Status: AC
  Administered 2015-08-25: 3 g via INTRAVENOUS
  Filled 2015-08-25: qty 3000

## 2015-08-25 MED ORDER — EPHEDRINE SULFATE 50 MG/ML IJ SOLN
INTRAMUSCULAR | Status: DC | PRN
  Administered 2015-08-25: 10 mg via INTRAVENOUS
  Administered 2015-08-25 (×2): 15 mg via INTRAVENOUS
  Administered 2015-08-25: 10 mg via INTRAVENOUS

## 2015-08-25 MED ORDER — KETOROLAC TROMETHAMINE 30 MG/ML IJ SOLN
INTRAMUSCULAR | Status: DC | PRN
  Administered 2015-08-25: 30 mg via INTRAVENOUS

## 2015-08-25 MED ORDER — CEFAZOLIN SODIUM-DEXTROSE 2-4 GM/100ML-% IV SOLN
INTRAVENOUS | Status: AC
  Filled 2015-08-25: qty 100

## 2015-08-25 MED ORDER — CEFAZOLIN SODIUM 1-5 GM-% IV SOLN
INTRAVENOUS | Status: AC
  Filled 2015-08-25: qty 50

## 2015-08-25 MED ORDER — MIDAZOLAM HCL 2 MG/2ML IJ SOLN
INTRAMUSCULAR | Status: AC
  Filled 2015-08-25: qty 2

## 2015-08-25 MED ORDER — FENTANYL CITRATE (PF) 100 MCG/2ML IJ SOLN
INTRAMUSCULAR | Status: DC | PRN
  Administered 2015-08-25 (×2): 25 ug via INTRAVENOUS

## 2015-08-25 MED ORDER — DEXAMETHASONE SODIUM PHOSPHATE 4 MG/ML IJ SOLN
INTRAMUSCULAR | Status: DC | PRN
  Administered 2015-08-25: 10 mg via INTRAVENOUS

## 2015-08-25 MED ORDER — PROMETHAZINE HCL 25 MG/ML IJ SOLN
6.2500 mg | INTRAMUSCULAR | Status: DC | PRN
  Filled 2015-08-25: qty 1

## 2015-08-25 MED ORDER — PROPOFOL 500 MG/50ML IV EMUL
INTRAVENOUS | Status: AC
  Filled 2015-08-25: qty 50

## 2015-08-25 MED ORDER — METOCLOPRAMIDE HCL 5 MG/ML IJ SOLN
INTRAMUSCULAR | Status: AC
  Filled 2015-08-25: qty 2

## 2015-08-25 MED ORDER — KETOROLAC TROMETHAMINE 30 MG/ML IJ SOLN
INTRAMUSCULAR | Status: AC
  Filled 2015-08-25: qty 1

## 2015-08-25 MED ORDER — FENTANYL CITRATE (PF) 100 MCG/2ML IJ SOLN
25.0000 ug | INTRAMUSCULAR | Status: DC | PRN
  Filled 2015-08-25: qty 1

## 2015-08-25 MED ORDER — LACTATED RINGERS IV SOLN
INTRAVENOUS | Status: DC
  Administered 2015-08-25 (×2): via INTRAVENOUS
  Filled 2015-08-25: qty 1000

## 2015-08-25 MED ORDER — PROPOFOL 10 MG/ML IV BOLUS
INTRAVENOUS | Status: DC | PRN
  Administered 2015-08-25: 300 mg via INTRAVENOUS

## 2015-08-25 MED ORDER — EPHEDRINE SULFATE 50 MG/ML IJ SOLN
INTRAMUSCULAR | Status: AC
  Filled 2015-08-25: qty 1

## 2015-08-25 MED ORDER — METOCLOPRAMIDE HCL 5 MG/ML IJ SOLN
INTRAMUSCULAR | Status: DC | PRN
  Administered 2015-08-25: 10 mg via INTRAVENOUS

## 2015-08-25 MED ORDER — DEXAMETHASONE SODIUM PHOSPHATE 10 MG/ML IJ SOLN
INTRAMUSCULAR | Status: AC
  Filled 2015-08-25: qty 1

## 2015-08-25 MED ORDER — ARTIFICIAL TEARS OP OINT
TOPICAL_OINTMENT | OPHTHALMIC | Status: AC
  Filled 2015-08-25: qty 3.5

## 2015-08-25 MED ORDER — OXYCODONE-ACETAMINOPHEN 5-325 MG PO TABS: 1.0000 | ORAL_TABLET | Freq: Four times a day (QID) | ORAL | Status: DC | PRN

## 2015-08-25 MED ORDER — LIDOCAINE HCL (CARDIAC) 20 MG/ML IV SOLN
INTRAVENOUS | Status: AC
  Filled 2015-08-25: qty 5

## 2015-08-25 MED ORDER — ONDANSETRON HCL 4 MG/2ML IJ SOLN
INTRAMUSCULAR | Status: AC
  Filled 2015-08-25: qty 2

## 2015-08-25 SURGICAL SUPPLY — 57 items
BENZOIN TINCTURE PRP APPL 2/3 (GAUZE/BANDAGES/DRESSINGS) ×3 IMPLANT
BLADE CLIPPER SURG (BLADE) ×3 IMPLANT
BLADE SURG 10 STRL SS (BLADE) IMPLANT
BLADE SURG 15 STRL LF DISP TIS (BLADE) ×2 IMPLANT
BLADE SURG 15 STRL SS (BLADE) ×1
BNDG GAUZE ELAST 4 BULKY (GAUZE/BANDAGES/DRESSINGS) ×3 IMPLANT
CANISTER SUCTION 1200CC (MISCELLANEOUS) IMPLANT
CANISTER SUCTION 2500CC (MISCELLANEOUS) ×3 IMPLANT
CLEANER CAUTERY TIP 5X5 PAD (MISCELLANEOUS) ×2 IMPLANT
CLOTH BEACON ORANGE TIMEOUT ST (SAFETY) ×3 IMPLANT
COVER BACK TABLE 60X90IN (DRAPES) ×3 IMPLANT
COVER MAYO STAND STRL (DRAPES) ×3 IMPLANT
DISSECTOR ROUND CHERRY 3/8 STR (MISCELLANEOUS) ×3 IMPLANT
DRAIN PENROSE 18X1/4 LTX STRL (WOUND CARE) IMPLANT
DRAPE LAPAROTOMY 100X72 PEDS (DRAPES) ×3 IMPLANT
DRSG KUZMA FLUFF (GAUZE/BANDAGES/DRESSINGS) ×3 IMPLANT
DRSG TEGADERM 2-3/8X2-3/4 SM (GAUZE/BANDAGES/DRESSINGS) ×3 IMPLANT
DRSG TEGADERM 4X4.75 (GAUZE/BANDAGES/DRESSINGS) ×3 IMPLANT
DRSG TELFA 3X8 NADH (GAUZE/BANDAGES/DRESSINGS) ×3 IMPLANT
ELECT NEEDLE TIP 2.8 STRL (NEEDLE) ×3 IMPLANT
ELECT REM PT RETURN 9FT ADLT (ELECTROSURGICAL) ×3
ELECTRODE REM PT RTRN 9FT ADLT (ELECTROSURGICAL) ×2 IMPLANT
GLOVE BIO SURGEON STRL SZ7.5 (GLOVE) ×3 IMPLANT
GOWN STRL REUS W/ TWL LRG LVL3 (GOWN DISPOSABLE) ×4 IMPLANT
GOWN STRL REUS W/TWL LRG LVL3 (GOWN DISPOSABLE) ×2
KIT ROOM TURNOVER WOR (KITS) ×3 IMPLANT
LOOP VESSEL MAXI BLUE (MISCELLANEOUS) IMPLANT
MANIFOLD NEPTUNE II (INSTRUMENTS) IMPLANT
NEEDLE HYPO 22GX1.5 SAFETY (NEEDLE) ×3 IMPLANT
NEEDLE HYPO 25X1 1.5 SAFETY (NEEDLE) ×3 IMPLANT
NS IRRIG 500ML POUR BTL (IV SOLUTION) ×3 IMPLANT
PACK BASIN DAY SURGERY FS (CUSTOM PROCEDURE TRAY) ×3 IMPLANT
PAD CLEANER CAUTERY TIP 5X5 (MISCELLANEOUS) ×1
PENCIL BUTTON HOLSTER BLD 10FT (ELECTRODE) ×3 IMPLANT
STRIP CLOSURE SKIN 1/2X4 (GAUZE/BANDAGES/DRESSINGS) IMPLANT
SUPPORT SCROTAL LG STRP (MISCELLANEOUS) ×3 IMPLANT
SUT SILK 3 0 SH 30 (SUTURE) IMPLANT
SUT VIC AB 2-0 CT1 27 (SUTURE) ×1
SUT VIC AB 2-0 CT1 TAPERPNT 27 (SUTURE) ×2 IMPLANT
SUT VIC AB 3-0 CT1 36 (SUTURE) IMPLANT
SUT VIC AB 3-0 SH 27 (SUTURE) ×1
SUT VIC AB 3-0 SH 27X BRD (SUTURE) ×2 IMPLANT
SUT VIC AB 4-0 SH 27 (SUTURE) ×1
SUT VIC AB 4-0 SH 27XANBCTRL (SUTURE) ×2 IMPLANT
SUT VIC AB 5-0 P-3 18X BRD (SUTURE) IMPLANT
SUT VIC AB 5-0 P3 18 (SUTURE)
SUT VICRYL 0 TIES 12 18 (SUTURE) ×3 IMPLANT
SUT VICRYL 4-0 PS2 18IN ABS (SUTURE) ×3 IMPLANT
SUT VICRYL AB 2 0 TIE (SUTURE) ×2 IMPLANT
SUT VICRYL AB 2 0 TIES (SUTURE) ×1
SYR BULB IRRIGATION 50ML (SYRINGE) ×3 IMPLANT
SYR CONTROL 10ML LL (SYRINGE) ×3 IMPLANT
TOWEL OR 17X24 6PK STRL BLUE (TOWEL DISPOSABLE) ×6 IMPLANT
TRAY DSU PREP LF (CUSTOM PROCEDURE TRAY) ×3 IMPLANT
TUBE CONNECTING 12X1/4 (SUCTIONS) ×3 IMPLANT
WATER STERILE IRR 500ML POUR (IV SOLUTION) IMPLANT
YANKAUER SUCT BULB TIP NO VENT (SUCTIONS) ×3 IMPLANT

## 2015-08-25 NOTE — Anesthesia Preprocedure Evaluation (Addendum)
Anesthesia Evaluation  Patient identified by MRN, date of birth, ID band Patient awake    Reviewed: Allergy & Precautions, NPO status , Patient's Chart, lab work & pertinent test results  Airway Mallampati: II  TM Distance: >3 FB Neck ROM: Full    Dental no notable dental hx.    Pulmonary sleep apnea and Continuous Positive Airway Pressure Ventilation ,    Pulmonary exam normal breath sounds clear to auscultation       Cardiovascular hypertension, Pt. on medications Normal cardiovascular exam Rhythm:Regular Rate:Normal  ECHO 2008: EF 55-60%   Neuro/Psych negative neurological ROS  negative psych ROS   GI/Hepatic negative GI ROS, Neg liver ROS,   Endo/Other  Morbid obesity  Renal/GU negative Renal ROS  negative genitourinary   Musculoskeletal  (+) Arthritis ,   Abdominal (+) + obese,   Peds negative pediatric ROS (+)  Hematology negative hematology ROS (+)   Anesthesia Other Findings   Reproductive/Obstetrics negative OB ROS                            Anesthesia Physical Anesthesia Plan  ASA: III  Anesthesia Plan: General   Post-op Pain Management:    Induction: Intravenous  Airway Management Planned: LMA  Additional Equipment:   Intra-op Plan:   Post-operative Plan: Extubation in OR  Informed Consent: I have reviewed the patients History and Physical, chart, labs and discussed the procedure including the risks, benefits and alternatives for the proposed anesthesia with the patient or authorized representative who has indicated his/her understanding and acceptance.   Dental advisory given  Plan Discussed with: CRNA  Anesthesia Plan Comments:         Anesthesia Quick Evaluation

## 2015-08-25 NOTE — Anesthesia Procedure Notes (Signed)
Procedure Name: LMA Insertion Date/Time: 08/25/2015 8:36 AM Performed by: Norva PavlovALLAWAY, Carlie Solorzano G Pre-anesthesia Checklist: Patient identified, Emergency Drugs available, Suction available and Patient being monitored Patient Re-evaluated:Patient Re-evaluated prior to inductionOxygen Delivery Method: Circle System Utilized Preoxygenation: Pre-oxygenation with 100% oxygen Intubation Type: IV induction Ventilation: Mask ventilation without difficulty LMA: LMA inserted LMA Size: 5.0 Number of attempts: 1 Airway Equipment and Method: bite block Placement Confirmation: positive ETCO2 Tube secured with: Tape Dental Injury: Teeth and Oropharynx as per pre-operative assessment

## 2015-08-25 NOTE — Interval H&P Note (Signed)
History and Physical Interval Note:  08/25/2015 8:26 AM  [name]  has presented today for surgery, with the diagnosis of RIGHT HYDROCELE, POSSIBLE SPERMATOCELE  The various methods of treatment have been discussed with the patient and family. After consideration of risks, benefits and other options for treatment, the patient has consented to  Procedure(s): SCROTUM EXPLORATION (N/A) RIGHT HYDROCELE REPAIR (Right) POSSIBLE SPERMATOCELE EXCISION (N/A) as a surgical intervention .  The patient's history has been reviewed, patient examined, no change in status, stable for surgery.  I have reviewed the patient's chart and labs.  Questions were answered to the patient's satisfaction.     Aracely Rickett,Jamen S

## 2015-08-25 NOTE — Anesthesia Postprocedure Evaluation (Signed)
Anesthesia Post Note  Patient: [name]  Procedure(s) Performed: Procedure(s) (LRB): SCROTUM EXPLORATION (N/A) RIGHT HYDROCELE REPAIR (Right) SPERMATOCELE EXCISION (N/A)  Patient location during evaluation: PACU Anesthesia Type: General Level of consciousness: awake and alert Pain management: pain level controlled Vital Signs Assessment: post-procedure vital signs reviewed and stable Respiratory status: spontaneous breathing, nonlabored ventilation, respiratory function stable and patient connected to nasal cannula oxygen Cardiovascular status: blood pressure returned to baseline and stable Postop Assessment: no signs of nausea or vomiting Anesthetic complications: no    Last Vitals:  Filed Vitals:   08/25/15 1000 08/25/15 1015  BP: 123/72 130/73  Pulse: 93 88  Temp:    Resp: 19 21    Last Pain: There were no vitals filed for this visit.               Knoxx Boeding J

## 2015-08-25 NOTE — H&P (Signed)
Reason for visit  Right scrotal exploration with repair of hydrocele and possible excision of spermatocele   History of Present Illness [name] was originally evaluated as a consultation request from Dr Gweneth DimitriWendy McNeill for further assessment of some scrotal enlargement. [name] is currently 60 years of age and without significant urologic history. He did have some voiding dysfunction and erectile dysfunction that started when he developed cauda equina syndrome about 15 years ago and for a while he was seeing Dr Mickel Crowon Sural. His situation improved and he really did not require any ongoing urologic followup. He has had some continued erectile dysfunction for which he takes Viagra. For a couple of years now he has noticed some enlargement of his right hemiscrotum. He has really not had any pain with this. He did undergo CT imaging of his pelvis which revealed no evidence of hernia, but he did have a fairly large amount of fluid within the scrotum, consistent with either a hydrocele or epididymal cyst. He really has no voiding complaints.    Past Medical History Problems  1. History of Arthritis 2. History of hypercholesterolemia (Z86.39) 3. History of hypertension (Z86.79)  Surgical History Problems  1. History of Back Surgery 2. History of Knee Replacement 3. History of Neurological Surgery Laminectomy 4. History of Total Hip Replacement  Current Meds 1. Aspirin 325 MG Oral Tablet;  Therapy: (Recorded:01Mar2016) to Recorded 2. CoQ10 CAPS;  Therapy: (Recorded:01Mar2016) to Recorded 3. CPAP;  Therapy: (Recorded:01Mar2016) to Recorded 4. Hydrocodone-Acetaminophen 5-325 MG Oral Tablet;  Therapy: (Recorded:01Mar2016) to Recorded 5. Lipitor 40 MG Oral Tablet;  Therapy: (Recorded:01Mar2016) to Recorded 6. Meloxicam 15 MG Oral Tablet;  Therapy: (Recorded:01Mar2016) to Recorded 7. Multi-Day TABS;  Therapy: (Recorded:01Mar2016) to Recorded 8. TraMADol HCl - 50 MG Oral Tablet;  Therapy:  (Recorded:01Mar2016) to Recorded 9. Tylenol PM Extra Strength TABS;  Therapy: (Recorded:01Mar2016) to Recorded 10. Viagra 100 MG Oral Tablet;   Therapy: (Recorded:01Mar2016) to Recorded 11. Vitamin D3 1000 UNIT Oral Capsule;   Therapy: (Recorded:01Mar2016) to Recorded  Allergies Medication  1. No Known Drug Allergies  Family History Problems  1. Family history of Family Health Status Number Of Children  Social History Problems    Marital History - Currently Married   Denied: History of Tobacco Use  Review of Systems Genitourinary, constitutional, skin, eye, otolaryngeal, hematologic/lymphatic, cardiovascular, pulmonary, endocrine, musculoskeletal, gastrointestinal, neurological and psychiatric system(s) were reviewed and pertinent findings if present are noted and are otherwise negative.  Genitourinary: erectile dysfunction and scrotal swelling, but no testicular pain and no scrotal pain.  Musculoskeletal: back pain.    Vitals   Height: 5 ft 11 in Weight: 294 lb  BMI Calculated: 41 BSA Calculated: 2.48 Blood Pressure: 162 / 88 Temperature: 97.3 F Heart Rate: 76  Physical Exam Constitutional: Well nourished and well developed . No acute distress.  ENT:. The ears and nose are normal in appearance.  Neck: The appearance of the neck is normal and no neck mass is present.  Pulmonary: No respiratory distress and normal respiratory rhythm and effort.  Cardiovascular: Heart rate and rhythm are normal . No peripheral edema.  Abdomen: The abdomen is soft and nontender. No masses are palpated. No CVA tenderness. No hernias are palpable. No hepatosplenomegaly noted.  Rectal: Rectal exam demonstrates normal sphincter tone, no tenderness and no masses. The prostate has no nodularity and is not tender. The left seminal vesicle is nonpalpable. The right seminal vesicle is nonpalpable. The perineum is normal on inspection.  Genitourinary: Examination of the penis  demonstrates no  discharge, no masses, no lesions and a normal meatus. The scrotum is without lesions. Examination of the right scrotum demonstrates a hydrocele. The right epididymis is palpably normal and non-tender. The left epididymis is palpably normal and non-tender. The right testis is nonpalpable, non-tender and without masses. The left testis is non-tender and without masses.  Lymphatics: The femoral and inguinal nodes are not enlarged or tender.  Skin: Normal skin turgor, no visible rash and no visible skin lesions.  Neuro/Psych:. Mood and affect are appropriate.    Scrotal ultrasonography: Full measurements are recorded on the worksheet. Both testes themselves were symmetric. They showed normal homogenous echo patterns and there was no evidence of any inner testicular significant process. Above the right testis, was a very large cystic mass. This was consistent most with a large spermatocele or hydrocele of the cord. The fluid did not completely surround the testicle but it is possible that this remains a hydrocele. In any event it is unilocular. It is large. There is no obvious solid component and is certainly consistent with a fluid-filled cyst. No other concerning pathology was appreciated. \   Assessment:  [name] does have a large simple appearing cystic fluid collection. Probable large spermatocele/cystic degeneration of the epididymis above the right testis. We still cannot completely rule out hydrocele. Treatment would be similar. This would include scrotal exploration with excision of the large cystic spermatocele or correction of the hydrocele depending on the exact pathology present at the time of exploration. He understands this would be elective and would be based on his symptoms. This is large enough that it is bothering him and he is interested in having surgical correction once he gets through and has recovered sufficiently from his back surgery. I have asked him to contact me when he is ready  to proceed with scheduling for the surgery. We did discuss surgery today in detail.

## 2015-08-25 NOTE — Op Note (Signed)
Preoperative diagnosis: Right scrotal enlargement Postoperative diagnosis: Right large spermatocele and small hydrocele  Procedure: Scrotal exploration with excision of large right spermatocele and surgical correction of right hydrocele   Surgeon: Valetta Fulleravid S. Edward Guthmiller M.D.  Anesthesia: Gen.  Indications: 60 year old male who is had some scrotal enlargement for several years.     This is become progressively more bothersome to him. He was interested in surgical correction. Ultrasound revealed a large cystic mass within the right hemiscrotum but it was difficult to determine if this was a large spermatocele or hydrocele. He presents now for exploration with surgical correction.     Technique and findings: Patient was brought the operating room where he had successful induction of general anesthesia. He was placed in supine position and prepped and draped in usual manner. Appropriate surgical timeout was performed. The right hemiscrotal compartment was entered. After blunt dissected technique the testicle and cystic mass were extracted from the right hemiscrotum. This very large cystic mass did not completely encircled the testicle and therefore was consistent with a very large spermatocele. There was a smaller hydrocele component as well.   Utilizing a combination of blunt and sharp dissected technique along with needlepoint cautery we were able to carefully excise the very large 8-10 cm spermatocele intact. This was sent for pathologic confirmation although was completely thin-walled and benign in appearance. The small amount of redundant hydrocele sac was then excised and the redundant tunica vaginalis was reefed utilizing some 4-0 Vicryl suture. The testis was returned to the right hemiscrotum making sure that there was no torsion or twisting of the spermatic cord. A spermatic cord block was also performed. The scrotal compartment was copiously irrigated. The scrotum was closed with some running Vicryl suture  in multiple layers. Tegaderm dressing was applied. No obvious Occasions occurred and patient was brought to PACU in stable condition.

## 2015-08-25 NOTE — Transfer of Care (Signed)
Last Vitals:  Filed Vitals:   08/25/15 0714 08/25/15 0934  BP: 153/71 128/73  Pulse: 73 97  Temp: 37.3 C 36.6 C  Resp: 18 15    Last Pain: There were no vitals filed for this visit.    Patients Stated Pain Goal: 4 (08/25/15 [id number])   Immediate Anesthesia Transfer of Care Note  Patient: [name]  Procedure(s) Performed: Procedure(s) (LRB): SCROTUM EXPLORATION (N/A) RIGHT HYDROCELE REPAIR (Right) SPERMATOCELE EXCISION (N/A)  Patient Location: PACU  Anesthesia Type: General  Level of Consciousness: awake, alert  and oriented  Airway & Oxygen Therapy: Patient Spontanous Breathing and Patient connected to face mask oxygen  Post-op Assessment: Report given to PACU RN and Post -op Vital signs reviewed and stable  Post vital signs: Reviewed and stable  Complications: No apparent anesthesia complications

## 2015-08-25 NOTE — Discharge Instructions (Addendum)
Scrotal surgery postoperative instructions ° °Wound: ° °In most cases your incision will have absorbable sutures that will dissolve within the first 10-20 days. Some will fall out even earlier. Expect some redness as the sutures dissolved but this should occur only around the sutures. If there is generalized redness, especially with increasing pain or swelling, let us know. The scrotum will very likely get "black and blue" as the blood in the tissues spread. Sometimes the whole scrotum will turn colors. The black and blue is followed by a yellow and brown color. In time, all the discoloration will go away. In some cases some firm swelling in the area of the testicle may persist for up to 4-6 weeks after the surgery and is considered normal in most cases. ° °Diet: ° °You may return to your normal diet within 24 hours following your surgery. You may note some mild nausea and possibly vomiting the first 6-8 hours following surgery. This is usually due to the side effects of anesthesia, and will disappear quite soon. I would suggest clear liquids and a very light meal the first evening following your surgery. ° °Activity: ° °Your physical activity should be restricted the first 48 hours. During that time you should remain relatively inactive, moving about only when necessary. During the first 7-10 days following surgery he should avoid lifting any heavy objects (anything greater than 15 pounds), and avoid strenuous exercise. If you work, ask us specifically about your restrictions, both for work and home. We will write a note to your employer if needed. ° °You should plan to wear a tight pair of jockey shorts or an athletic supporter for the first 4-5 days, even to sleep. This will keep the scrotum immobilized to some degree and keep the swelling down. ° °Ice packs should be placed on and off over the scrotum for the first 48 hours. Frozen peas or corn in a ZipLock bag can be frozen, used and re-frozen. Fifteen minutes  on and 15 minutes off is a reasonable schedule. The ice is a good pain reliever and keeps the swelling down. ° °Hygiene: ° °You may shower 48 hours after your surgery. Tub bathing should be restricted until the seventh day. ° ° ° °Medication: ° °You will be sent home with some type of pain medication. In many cases you will be sent home with a narcotic pain pill (Vicodin or Tylox). If the pain is not too bad, you may take either Tylenol (acetaminophen) or Advil (ibuprofen) which contain no narcotic agents, and might be tolerated a little better, with fewer side effects. If the pain medication you are sent home with does not control the pain, you will have to let us know. Some narcotic pain medications cannot be given or refilled by a phone call to a pharmacy. ° °Problems you should report to us: ° °· Fever of 101.0 degrees Fahrenheit or greater. °· Moderate or severe swelling under the skin incision or involving the scrotum. °· Drug reaction such as hives, a rash, nausea or vomiting. ° ° ° ° °Post Anesthesia Home Care Instructions ° °Activity: °Get plenty of rest for the remainder of the day. A responsible adult should stay with you for 24 hours following the procedure.  °For the next 24 hours, DO NOT: °-Drive a car °-Operate machinery °-Drink alcoholic beverages °-Take any medication unless instructed by your physician °-Make any legal decisions or sign important papers. ° °Meals: °Start with liquid foods such as gelatin or soup. Progress to regular   foods as tolerated. Avoid greasy, spicy, heavy foods. If nausea and/or vomiting occur, drink only clear liquids until the nausea and/or vomiting subsides. Call your physician if vomiting continues. ° °Special Instructions/Symptoms: °Your throat may feel dry or sore from the anesthesia or the breathing tube placed in your throat during surgery. If this causes discomfort, gargle with warm salt water. The discomfort should disappear within 24 hours. ° °If you had a  scopolamine patch placed behind your ear for the management of post- operative nausea and/or vomiting: ° °1. The medication in the patch is effective for 72 hours, after which it should be removed.  Wrap patch in a tissue and discard in the trash. Wash hands thoroughly with soap and water. °2. You may remove the patch earlier than 72 hours if you experience unpleasant side effects which may include dry mouth, dizziness or visual disturbances. °3. Avoid touching the patch. Wash your hands with soap and water after contact with the patch. °  ° °

## 2015-08-26 ENCOUNTER — Encounter (HOSPITAL_BASED_OUTPATIENT_CLINIC_OR_DEPARTMENT_OTHER): Payer: Self-pay | Admitting: Urology

## 2015-09-27 ENCOUNTER — Other Ambulatory Visit: Payer: Self-pay | Admitting: Gastroenterology

## 2016-04-05 ENCOUNTER — Other Ambulatory Visit: Payer: Self-pay | Admitting: Anesthesiology

## 2016-04-05 DIAGNOSIS — M5416 Radiculopathy, lumbar region: Secondary | ICD-10-CM

## 2016-04-23 ENCOUNTER — Ambulatory Visit
Admission: RE | Admit: 2016-04-23 | Discharge: 2016-04-23 | Disposition: A | Discharge status: Home / Self Care | Payer: BLUE CROSS/BLUE SHIELD | Source: Ambulatory Visit | Attending: Anesthesiology | Admitting: Anesthesiology

## 2016-04-23 DIAGNOSIS — M5416 Radiculopathy, lumbar region: Secondary | ICD-10-CM

## 2016-04-23 MED ORDER — GADOBENATE DIMEGLUMINE 529 MG/ML IV SOLN
20.0000 mL | Freq: Once | INTRAVENOUS | Status: AC | PRN
  Administered 2016-04-23: 20 mL via INTRAVENOUS

## 2016-05-09 ENCOUNTER — Other Ambulatory Visit: Payer: Self-pay | Admitting: Neurological Surgery

## 2016-05-09 DIAGNOSIS — M48062 Spinal stenosis, lumbar region with neurogenic claudication: Secondary | ICD-10-CM

## 2016-05-19 ENCOUNTER — Ambulatory Visit
Admission: RE | Admit: 2016-05-19 | Discharge: 2016-05-19 | Disposition: A | Discharge status: Home / Self Care | Payer: BLUE CROSS/BLUE SHIELD | Source: Ambulatory Visit | Attending: Neurological Surgery | Admitting: Neurological Surgery

## 2016-05-19 VITALS — BP 113/66 | HR 71

## 2016-05-19 DIAGNOSIS — M48062 Spinal stenosis, lumbar region with neurogenic claudication: Secondary | ICD-10-CM

## 2016-05-19 DIAGNOSIS — Z981 Arthrodesis status: Secondary | ICD-10-CM

## 2016-05-19 MED ORDER — IOPAMIDOL (ISOVUE-M 200) INJECTION 41%
15.0000 mL | Freq: Once | INTRAMUSCULAR | Status: AC
  Administered 2016-05-19: 15 mL via INTRATHECAL

## 2016-05-19 MED ORDER — ONDANSETRON HCL 4 MG/2ML IJ SOLN: 4.0000 mg | Freq: Four times a day (QID) | INTRAMUSCULAR | Status: DC | PRN

## 2016-05-19 MED ORDER — DIAZEPAM 5 MG PO TABS
10.0000 mg | ORAL_TABLET | Freq: Once | ORAL | Status: AC
  Administered 2016-05-19: 10 mg via ORAL

## 2016-05-19 NOTE — Discharge Instructions (Signed)

## 2016-11-30 ENCOUNTER — Encounter: Payer: BLUE CROSS/BLUE SHIELD | Attending: Psychology | Admitting: Psychology

## 2016-11-30 DIAGNOSIS — I10 Essential (primary) hypertension: Secondary | ICD-10-CM | POA: Insufficient documentation

## 2016-11-30 DIAGNOSIS — M1990 Unspecified osteoarthritis, unspecified site: Secondary | ICD-10-CM | POA: Insufficient documentation

## 2016-11-30 DIAGNOSIS — G894 Chronic pain syndrome: Secondary | ICD-10-CM | POA: Insufficient documentation

## 2016-11-30 DIAGNOSIS — E785 Hyperlipidemia, unspecified: Secondary | ICD-10-CM | POA: Insufficient documentation

## 2016-11-30 DIAGNOSIS — M545 Low back pain: Secondary | ICD-10-CM | POA: Diagnosis not present

## 2016-11-30 DIAGNOSIS — G4733 Obstructive sleep apnea (adult) (pediatric): Secondary | ICD-10-CM | POA: Diagnosis not present

## 2016-12-14 ENCOUNTER — Encounter: Payer: Self-pay | Admitting: Psychology

## 2016-12-14 ENCOUNTER — Encounter (HOSPITAL_BASED_OUTPATIENT_CLINIC_OR_DEPARTMENT_OTHER): Payer: BLUE CROSS/BLUE SHIELD | Admitting: Psychology

## 2016-12-14 DIAGNOSIS — G894 Chronic pain syndrome: Secondary | ICD-10-CM | POA: Diagnosis not present
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
## 2017-01-08 ENCOUNTER — Ambulatory Visit: Payer: BLUE CROSS/BLUE SHIELD | Admitting: Psychology

## 2017-01-09 ENCOUNTER — Ambulatory Visit: Payer: BLUE CROSS/BLUE SHIELD | Admitting: Psychology

## 2017-01-24 ENCOUNTER — Other Ambulatory Visit: Payer: Self-pay | Admitting: Neurological Surgery

## 2017-01-24 DIAGNOSIS — M546 Pain in thoracic spine: Secondary | ICD-10-CM

## 2017-01-28 ENCOUNTER — Ambulatory Visit
Admission: RE | Admit: 2017-01-28 | Discharge: 2017-01-28 | Disposition: A | Discharge status: Home / Self Care | Payer: BLUE CROSS/BLUE SHIELD | Source: Ambulatory Visit | Attending: Neurological Surgery | Admitting: Neurological Surgery

## 2017-01-28 DIAGNOSIS — M546 Pain in thoracic spine: Secondary | ICD-10-CM

## 2017-01-30 ENCOUNTER — Other Ambulatory Visit: Payer: Self-pay | Admitting: Neurological Surgery

## 2017-01-30 DIAGNOSIS — M542 Cervicalgia: Secondary | ICD-10-CM

## 2017-02-07 ENCOUNTER — Ambulatory Visit
Admission: RE | Admit: 2017-02-07 | Discharge: 2017-02-07 | Disposition: A | Discharge status: Home / Self Care | Payer: BLUE CROSS/BLUE SHIELD | Source: Ambulatory Visit | Attending: Neurological Surgery | Admitting: Neurological Surgery

## 2017-02-07 DIAGNOSIS — M542 Cervicalgia: Secondary | ICD-10-CM

## 2017-02-09 ENCOUNTER — Ambulatory Visit
Admission: RE | Admit: 2017-02-09 | Discharge: 2017-02-09 | Disposition: A | Discharge status: Home / Self Care | Payer: BLUE CROSS/BLUE SHIELD | Source: Ambulatory Visit | Attending: Neurological Surgery | Admitting: Neurological Surgery

## 2017-02-18 IMAGING — NM NM BONE 3 PHASE
3 series · 18 of 18 positions shown · non-contrast
Comparison: None.

CLINICAL DATA: Right hip and groin pain.

EXAM:
NUCLEAR MEDICINE 3-PHASE BONE SCAN
TECHNIQUE: Radionuclide angiographic images, immediate static blood pool
images, and 3-hour delayed static images were obtained of the pelvis
after intravenous injection of radiopharmaceutical.
RADIOPHARMACEUTICALS:  25.0 Technetium 99 MDP

[Series 1: wbr_bone_60 flow · 2.07mm/px · 6 of 48 frames shown (1 of 2)]
[frame 5/48]
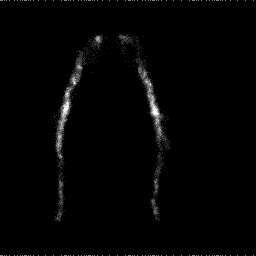
[frame 13/48  full-range]
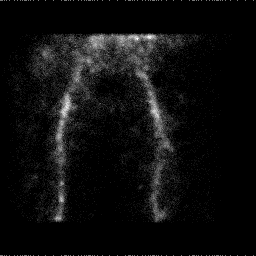
[frame 21/48  full-range]
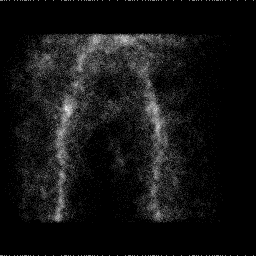
[frame 29/48  full-range]
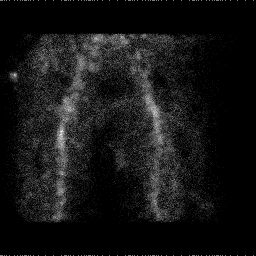
[frame 37/48  full-range]
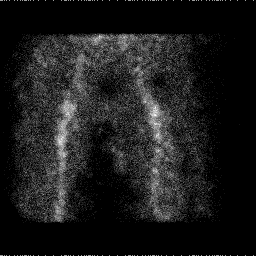
[frame 45/48  full-range]
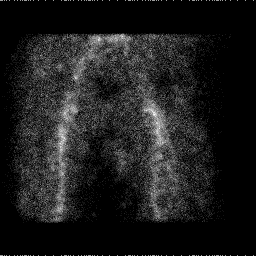

[Series 1: flow · 2.07mm/px · 6 of 48 frames shown]
[frame 5/48  full-range]
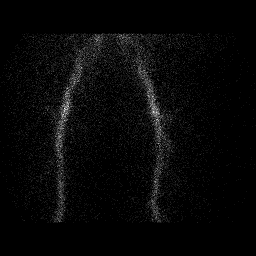
[frame 13/48  full-range]
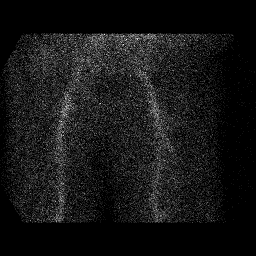
[frame 21/48  full-range]
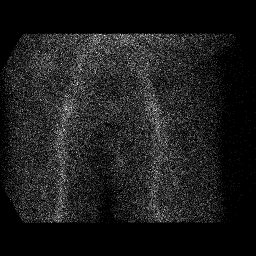
[frame 29/48  full-range]
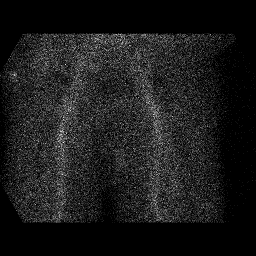
[frame 37/48  full-range]
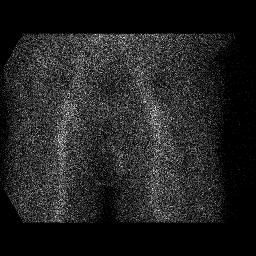
[frame 45/48  full-range]
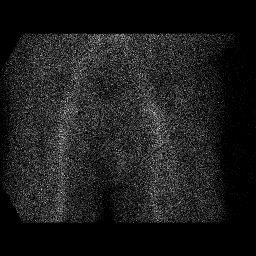

[Series 1: wbr_bone_60 flow · 2.07mm/px · 6 of 48 frames shown (2 of 2)]
[frame 5/48]
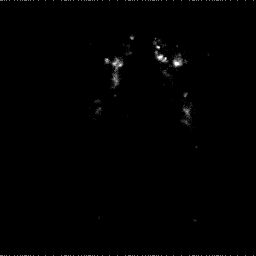
[frame 13/48  full-range]
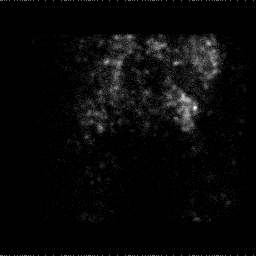
[frame 21/48  full-range]
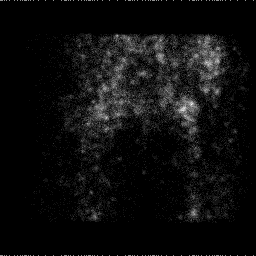
[frame 29/48  full-range]
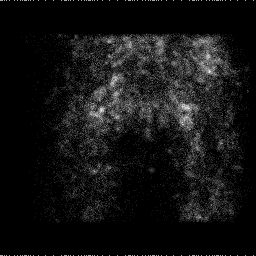
[frame 37/48  full-range]
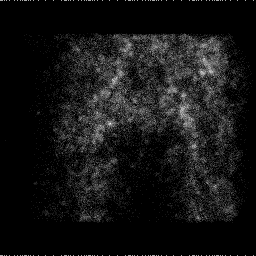
[frame 45/48  full-range]
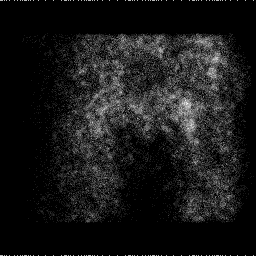

[18 of 18 positions shown; findings below may reference images not displayed]

FINDINGS: Vascular phase: On the flow phase portion of the examination there
is normal perfusion to both hips.

Blood pool phase: There is no abnormal blood pool activity
identified.

Delayed phase: Symmetric radiotracer uptake to both hips identified.
A small focus of asymmetric uptake localizes to the lesser
trochanter of the left hip.
IMPRESSION: 1. No evidence for right hip arthroplasty device loosening or
infection.

## 2017-04-02 ENCOUNTER — Other Ambulatory Visit: Payer: Self-pay | Admitting: Neurological Surgery

## 2017-04-23 ENCOUNTER — Other Ambulatory Visit: Payer: Self-pay | Admitting: Neurological Surgery

## 2017-04-27 ENCOUNTER — Encounter (HOSPITAL_COMMUNITY): Payer: Self-pay

## 2017-04-27 ENCOUNTER — Encounter (HOSPITAL_COMMUNITY)
Admission: RE | Admit: 2017-04-27 | Discharge: 2017-04-27 | Disposition: A | Discharge status: Home / Self Care | Payer: BLUE CROSS/BLUE SHIELD | Source: Ambulatory Visit | Attending: Neurological Surgery | Admitting: Neurological Surgery

## 2017-04-27 ENCOUNTER — Other Ambulatory Visit: Payer: Self-pay

## 2017-04-27 ENCOUNTER — Other Ambulatory Visit: Payer: Self-pay | Admitting: Neurological Surgery

## 2017-04-27 DIAGNOSIS — Z01812 Encounter for preprocedural laboratory examination: Secondary | ICD-10-CM | POA: Diagnosis not present

## 2017-04-27 DIAGNOSIS — M1990 Unspecified osteoarthritis, unspecified site: Secondary | ICD-10-CM | POA: Insufficient documentation

## 2017-04-27 DIAGNOSIS — Z01810 Encounter for preprocedural cardiovascular examination: Secondary | ICD-10-CM | POA: Diagnosis present

## 2017-04-27 DIAGNOSIS — G4730 Sleep apnea, unspecified: Secondary | ICD-10-CM | POA: Insufficient documentation

## 2017-04-27 DIAGNOSIS — M4802 Spinal stenosis, cervical region: Secondary | ICD-10-CM | POA: Insufficient documentation

## 2017-04-27 DIAGNOSIS — I10 Essential (primary) hypertension: Secondary | ICD-10-CM | POA: Insufficient documentation

## 2017-04-27 LAB — CBC
HEMATOCRIT: 42.5 % (ref 39.0–52.0)
HEMOGLOBIN: 14.2 g/dL (ref 13.0–17.0)
MCH: 28.8 pg (ref 26.0–34.0)
MCHC: 33.4 g/dL (ref 30.0–36.0)
MCV: 86.2 fL (ref 78.0–100.0)
Platelets: 229 10*3/uL (ref 150–400)
RBC: 4.93 MIL/uL (ref 4.22–5.81)
RDW: 13.7 % (ref 11.5–15.5)
WBC: 10.9 10*3/uL — ABNORMAL HIGH (ref 4.0–10.5)

## 2017-04-27 LAB — BASIC METABOLIC PANEL
ANION GAP: 7 (ref 5–15)
BUN: 16 mg/dL (ref 6–20)
CALCIUM: 9.2 mg/dL (ref 8.9–10.3)
CO2: 27 mmol/L (ref 22–32)
Chloride: 104 mmol/L (ref 101–111)
Creatinine, Ser: 0.82 mg/dL (ref 0.61–1.24)
GFR calc Af Amer: >60 mL/min (ref >60)
Glucose, Bld: 123 mg/dL — ABNORMAL HIGH (ref 65–99)
POTASSIUM: 4.2 mmol/L (ref 3.5–5.1)
SODIUM: 138 mmol/L (ref 135–145)

## 2017-04-27 LAB — TYPE AND SCREEN
ABO/RH(D): A POS
Antibody Screen: NEGATIVE

## 2017-04-27 LAB — SURGICAL PCR SCREEN
MRSA, PCR: NEGATIVE
Staphylococcus aureus: NEGATIVE

## 2017-04-27 NOTE — Progress Notes (Signed)
PCP - Gweneth DimitriWendy McNeill Cardiologist - denies  EKG - 04/27/2017 Stress Test - 06/23/10 ECHO - 2008  Sleep Study - requested from Dr. Particia Lathersbourne  Aspirin Instructions: Last dose 04/25/17   Patient denies shortness of breath, fever, cough and chest pain at PAT appointment   Patient verbalized understanding of instructions that were given to them at the PAT appointment. Patient was also instructed that they will need to review over the PAT instructions again at home before surgery.

## 2017-04-27 NOTE — Pre-Procedure Instructions (Signed)
Steven GrippeDavid L Nunez  04/27/2017      [facility] - [place], [place] - [address] [place] [id number] Phone: [phone] Fax: [phone]    Your procedure is scheduled on Wednesday January 16.  [facility] 6:45 [facility].M.  Call this number if you have problems the morning of surgery:  [phone]   Remember:  Do not eat food or drink liquids after midnight.  Take these medicines the morning of surgery with A SIP OF WATER:   Methocarbamol (robaxin) if needed Oxycodone if needed Afrin nasal spray  7 days prior to surgery STOP taking any celebrex (celecoxib), Aleve, Naproxen, Ibuprofen, Motrin, Advil, Goody's, BC's, all herbal medications, fish oil, and all vitamins  **FOLLOW your surgeon's instructions on stopping Aspirin. If no instructions were given, then please call your surgeon's office.**    Do not wear jewelry, make-up or nail polish.  Do not wear lotions, powders, or perfumes, or deodorant.  Do not shave 48 hours prior to surgery.  Men may shave face and neck.  Do not bring valuables to the hospital.  [facility] is not responsible for any belongings or valuables.  Contacts, dentures or bridgework may not be worn into surgery.  Leave your suitcase in the car.  After surgery it may be brought to your room.  For patients admitted to the hospital, discharge time will be determined by your treatment team.  Patients discharged the day of surgery will not be allowed to drive home.   Special instructions:    [place]- Preparing For Surgery  Before surgery, you can play an important role. Because skin is not sterile, your skin needs to be as free of germs as possible. You can reduce the number of germs on your skin by washing with CHG (chlorahexidine gluconate) Soap before surgery.  CHG is an antiseptic cleaner which kills germs and bonds with the skin to continue killing germs even  after washing.  Please do not use if you have an allergy to CHG or antibacterial soaps. If your skin becomes reddened/irritated stop using the CHG.  Do not shave (including legs and underarms) for at least 48 hours prior to first CHG shower. It is OK to shave your face.  Please follow these instructions carefully.   1. Shower the NIGHT BEFORE SURGERY and the MORNING OF SURGERY with CHG.   2. If you chose to wash your hair, wash your hair first as usual with your normal shampoo.  3. After you shampoo, rinse your hair and body thoroughly to remove the shampoo.  4. Use CHG as you would any other liquid soap. You can apply CHG directly to the skin and wash gently with a scrungie or a clean washcloth.   5. Apply the CHG Soap to your body ONLY FROM THE NECK DOWN.  Do not use on open wounds or open sores. Avoid contact with your eyes, ears, mouth and genitals (private parts). Wash Face and genitals (private parts)  with your normal soap.  6. Wash thoroughly, paying special attention to the area where your surgery will be performed.  7. Thoroughly rinse your body with warm water from the neck down.  8. DO NOT shower/wash with your normal soap after using and rinsing off the CHG Soap.  9. Pat yourself dry with a CLEAN TOWEL.  10. Wear CLEAN PAJAMAS to bed the night before surgery, wear comfortable clothes the morning of surgery  11.  Place CLEAN SHEETS on your bed the night of your first shower and DO NOT SLEEP WITH PETS.    Day of Surgery: Do not apply any deodorants/lotions. Please wear clean clothes to the hospital/surgery center.      Please read over the following fact sheets that you were given. Coughing and Deep Breathing, MRSA Information and Surgical Site Infection Prevention

## 2017-05-01 MED ORDER — DEXAMETHASONE SODIUM PHOSPHATE 10 MG/ML IJ SOLN
10.0000 mg | INTRAMUSCULAR | Status: DC
  Filled 2017-05-01: qty 1

## 2017-05-01 MED ORDER — DEXTROSE 5 % IV SOLN
3.0000 g | INTRAVENOUS | Status: AC
  Administered 2017-05-02: 3 g via INTRAVENOUS
  Filled 2017-05-01: qty 3

## 2017-05-02 ENCOUNTER — Inpatient Hospital Stay (HOSPITAL_COMMUNITY)
Admission: RE | Admit: 2017-05-02 | Discharge: 2017-05-03 | DRG: 472 | Disposition: A | Discharge status: Home / Self Care | Payer: BLUE CROSS/BLUE SHIELD | Source: Ambulatory Visit | Attending: Neurological Surgery | Admitting: Neurological Surgery

## 2017-05-02 ENCOUNTER — Inpatient Hospital Stay (HOSPITAL_COMMUNITY): Payer: BLUE CROSS/BLUE SHIELD

## 2017-05-02 ENCOUNTER — Encounter (HOSPITAL_COMMUNITY)
Admission: RE | Disposition: A | Discharge status: Home / Self Care | Payer: Self-pay | Source: Ambulatory Visit | Attending: Neurological Surgery

## 2017-05-02 ENCOUNTER — Inpatient Hospital Stay (HOSPITAL_COMMUNITY): Payer: BLUE CROSS/BLUE SHIELD | Admitting: Certified Registered"

## 2017-05-02 ENCOUNTER — Encounter (HOSPITAL_COMMUNITY): Payer: Self-pay | Admitting: *Deleted

## 2017-05-02 DIAGNOSIS — E785 Hyperlipidemia, unspecified: Secondary | ICD-10-CM | POA: Diagnosis present

## 2017-05-02 DIAGNOSIS — M50222 Other cervical disc displacement at C5-C6 level: Secondary | ICD-10-CM | POA: Diagnosis present

## 2017-05-02 DIAGNOSIS — Z96653 Presence of artificial knee joint, bilateral: Secondary | ICD-10-CM | POA: Diagnosis present

## 2017-05-02 DIAGNOSIS — G4733 Obstructive sleep apnea (adult) (pediatric): Secondary | ICD-10-CM | POA: Diagnosis present

## 2017-05-02 DIAGNOSIS — Z96643 Presence of artificial hip joint, bilateral: Secondary | ICD-10-CM | POA: Diagnosis present

## 2017-05-02 DIAGNOSIS — Z7982 Long term (current) use of aspirin: Secondary | ICD-10-CM

## 2017-05-02 DIAGNOSIS — M542 Cervicalgia: Secondary | ICD-10-CM | POA: Diagnosis present

## 2017-05-02 DIAGNOSIS — M4802 Spinal stenosis, cervical region: Secondary | ICD-10-CM | POA: Diagnosis present

## 2017-05-02 DIAGNOSIS — Z79899 Other long term (current) drug therapy: Secondary | ICD-10-CM

## 2017-05-02 DIAGNOSIS — G5602 Carpal tunnel syndrome, left upper limb: Secondary | ICD-10-CM | POA: Diagnosis present

## 2017-05-02 DIAGNOSIS — Z981 Arthrodesis status: Secondary | ICD-10-CM

## 2017-05-02 DIAGNOSIS — Z6841 Body Mass Index (BMI) 40.0 and over, adult: Secondary | ICD-10-CM | POA: Diagnosis not present

## 2017-05-02 DIAGNOSIS — I10 Essential (primary) hypertension: Secondary | ICD-10-CM | POA: Diagnosis present

## 2017-05-02 DIAGNOSIS — Z419 Encounter for procedure for purposes other than remedying health state, unspecified: Secondary | ICD-10-CM

## 2017-05-02 HISTORY — PX: POSTERIOR CERVICAL FUSION/FORAMINOTOMY: SHX5038

## 2017-05-02 HISTORY — PX: CARPAL TUNNEL RELEASE: SHX101

## 2017-05-02 SURGERY — POSTERIOR CERVICAL FUSION/FORAMINOTOMY LEVEL 3
Anesthesia: General

## 2017-05-02 MED ORDER — ALBUMIN HUMAN 5 % IV SOLN
INTRAVENOUS | Status: DC | PRN
  Administered 2017-05-02: 12:00:00 via INTRAVENOUS

## 2017-05-02 MED ORDER — PROPOFOL 10 MG/ML IV BOLUS
INTRAVENOUS | Status: DC | PRN
  Administered 2017-05-02: 200 mg via INTRAVENOUS

## 2017-05-02 MED ORDER — HEMOSTATIC AGENTS (NO CHARGE) OPTIME
TOPICAL | Status: DC | PRN
  Administered 2017-05-02: 1 via TOPICAL

## 2017-05-02 MED ORDER — PHENYLEPHRINE 40 MCG/ML (10ML) SYRINGE FOR IV PUSH (FOR BLOOD PRESSURE SUPPORT)
PREFILLED_SYRINGE | INTRAVENOUS | Status: DC | PRN
  Administered 2017-05-02: 40 ug via INTRAVENOUS
  Administered 2017-05-02: 80 ug via INTRAVENOUS

## 2017-05-02 MED ORDER — LIDOCAINE 2% (20 MG/ML) 5 ML SYRINGE
INTRAMUSCULAR | Status: DC | PRN
  Administered 2017-05-02: 100 mg via INTRAVENOUS

## 2017-05-02 MED ORDER — METHOCARBAMOL 500 MG PO TABS
ORAL_TABLET | ORAL | Status: AC
  Filled 2017-05-02: qty 1

## 2017-05-02 MED ORDER — DEXAMETHASONE SODIUM PHOSPHATE 10 MG/ML IJ SOLN
INTRAMUSCULAR | Status: AC
  Filled 2017-05-02: qty 1

## 2017-05-02 MED ORDER — VANCOMYCIN HCL 1000 MG IV SOLR
INTRAVENOUS | Status: AC
  Filled 2017-05-02: qty 1000

## 2017-05-02 MED ORDER — HYDROMORPHONE HCL 1 MG/ML IJ SOLN
INTRAMUSCULAR | Status: AC
  Administered 2017-05-02: 0.5 mg via INTRAVENOUS
  Filled 2017-05-02: qty 1

## 2017-05-02 MED ORDER — PROMETHAZINE HCL 25 MG/ML IJ SOLN: 6.2500 mg | INTRAMUSCULAR | Status: DC | PRN

## 2017-05-02 MED ORDER — HYDROMORPHONE HCL 1 MG/ML IJ SOLN
INTRAMUSCULAR | Status: AC
  Filled 2017-05-02: qty 0.5

## 2017-05-02 MED ORDER — EPHEDRINE SULFATE-NACL 50-0.9 MG/10ML-% IV SOSY
PREFILLED_SYRINGE | INTRAVENOUS | Status: DC | PRN
  Administered 2017-05-02 (×2): 5 mg via INTRAVENOUS

## 2017-05-02 MED ORDER — ONDANSETRON HCL 4 MG/2ML IJ SOLN
INTRAMUSCULAR | Status: AC
  Filled 2017-05-02: qty 2

## 2017-05-02 MED ORDER — LACTATED RINGERS IV SOLN
INTRAVENOUS | Status: DC | PRN
  Administered 2017-05-02 (×2): via INTRAVENOUS

## 2017-05-02 MED ORDER — ROCURONIUM BROMIDE 10 MG/ML (PF) SYRINGE
PREFILLED_SYRINGE | INTRAVENOUS | Status: DC | PRN
  Administered 2017-05-02: 60 mg via INTRAVENOUS
  Administered 2017-05-02: 20 mg via INTRAVENOUS
  Administered 2017-05-02: 40 mg via INTRAVENOUS
  Administered 2017-05-02: 20 mg via INTRAVENOUS

## 2017-05-02 MED ORDER — LACTATED RINGERS IV SOLN
INTRAVENOUS | Status: DC
  Administered 2017-05-02: 08:00:00 via INTRAVENOUS

## 2017-05-02 MED ORDER — THROMBIN (RECOMBINANT) 5000 UNITS EX SOLR
CUTANEOUS | Status: AC
  Filled 2017-05-02: qty 5000

## 2017-05-02 MED ORDER — ONDANSETRON HCL 4 MG/2ML IJ SOLN: 4.0000 mg | Freq: Four times a day (QID) | INTRAMUSCULAR | Status: DC | PRN

## 2017-05-02 MED ORDER — MIDAZOLAM HCL 2 MG/2ML IJ SOLN
INTRAMUSCULAR | Status: DC | PRN
  Administered 2017-05-02: 2 mg via INTRAVENOUS

## 2017-05-02 MED ORDER — CELECOXIB 200 MG PO CAPS
200.0000 mg | ORAL_CAPSULE | Freq: Two times a day (BID) | ORAL | Status: DC
  Administered 2017-05-02 – 2017-05-03 (×3): 200 mg via ORAL
  Filled 2017-05-02 (×3): qty 1

## 2017-05-02 MED ORDER — CHLORHEXIDINE GLUCONATE CLOTH 2 % EX PADS
6.0000 | MEDICATED_PAD | Freq: Once | CUTANEOUS | Status: DC
  Administered 2017-05-03: 6 via TOPICAL

## 2017-05-02 MED ORDER — HYDROMORPHONE HCL 1 MG/ML IJ SOLN
INTRAMUSCULAR | Status: DC | PRN
  Administered 2017-05-02: 0.5 mg via INTRAVENOUS

## 2017-05-02 MED ORDER — SODIUM CHLORIDE 0.9 % IR SOLN
Status: DC | PRN
  Administered 2017-05-02: 09:00:00

## 2017-05-02 MED ORDER — VANCOMYCIN HCL 1000 MG IV SOLR
INTRAVENOUS | Status: DC | PRN
  Administered 2017-05-02: 1000 mg

## 2017-05-02 MED ORDER — CEFAZOLIN SODIUM-DEXTROSE 2-4 GM/100ML-% IV SOLN
2.0000 g | Freq: Three times a day (TID) | INTRAVENOUS | Status: AC
  Administered 2017-05-02 – 2017-05-03 (×2): 2 g via INTRAVENOUS
  Filled 2017-05-02 (×2): qty 100

## 2017-05-02 MED ORDER — METHOCARBAMOL 1000 MG/10ML IJ SOLN
500.0000 mg | Freq: Four times a day (QID) | INTRAVENOUS | Status: DC | PRN
  Filled 2017-05-02: qty 5

## 2017-05-02 MED ORDER — BACITRACIN ZINC 500 UNIT/GM EX OINT
TOPICAL_OINTMENT | CUTANEOUS | Status: DC | PRN
  Administered 2017-05-02: 1 via TOPICAL

## 2017-05-02 MED ORDER — METHOCARBAMOL 500 MG PO TABS
500.0000 mg | ORAL_TABLET | Freq: Four times a day (QID) | ORAL | Status: DC | PRN
  Administered 2017-05-02 – 2017-05-03 (×3): 500 mg via ORAL
  Filled 2017-05-02 (×2): qty 1

## 2017-05-02 MED ORDER — KETAMINE HCL-SODIUM CHLORIDE 100-0.9 MG/10ML-% IV SOSY
PREFILLED_SYRINGE | INTRAVENOUS | Status: AC
  Filled 2017-05-02: qty 10

## 2017-05-02 MED ORDER — SUGAMMADEX SODIUM 200 MG/2ML IV SOLN
INTRAVENOUS | Status: DC | PRN
  Administered 2017-05-02: 300 mg via INTRAVENOUS

## 2017-05-02 MED ORDER — ACETAMINOPHEN 650 MG RE SUPP
650.0000 mg | RECTAL | Status: DC | PRN
Start: 2017-05-02 — End: 2017-05-03

## 2017-05-02 MED ORDER — DEXAMETHASONE SODIUM PHOSPHATE 10 MG/ML IJ SOLN
INTRAMUSCULAR | Status: DC | PRN
  Administered 2017-05-02: 10 mg via INTRAVENOUS

## 2017-05-02 MED ORDER — PHENYLEPHRINE 40 MCG/ML (10ML) SYRINGE FOR IV PUSH (FOR BLOOD PRESSURE SUPPORT)
PREFILLED_SYRINGE | INTRAVENOUS | Status: AC
  Filled 2017-05-02: qty 10

## 2017-05-02 MED ORDER — OXYCODONE HCL 5 MG/5ML PO SOLN
5.0000 mg | Freq: Once | ORAL | Status: DC | PRN
Start: 2017-05-02 — End: 2017-05-02

## 2017-05-02 MED ORDER — PROPOFOL 10 MG/ML IV BOLUS
INTRAVENOUS | Status: AC
  Filled 2017-05-02: qty 20

## 2017-05-02 MED ORDER — EPHEDRINE 5 MG/ML INJ
INTRAVENOUS | Status: AC
  Filled 2017-05-02: qty 10

## 2017-05-02 MED ORDER — OXYCODONE HCL 5 MG PO TABS: 5.0000 mg | ORAL_TABLET | Freq: Once | ORAL | Status: DC | PRN

## 2017-05-02 MED ORDER — ACETAMINOPHEN 325 MG PO TABS
650.0000 mg | ORAL_TABLET | ORAL | Status: DC | PRN
Start: 2017-05-02 — End: 2017-05-03

## 2017-05-02 MED ORDER — POTASSIUM CHLORIDE IN NACL 20-0.9 MEQ/L-% IV SOLN: INTRAVENOUS | Status: DC

## 2017-05-02 MED ORDER — KETAMINE HCL 10 MG/ML IJ SOLN
INTRAMUSCULAR | Status: DC | PRN
  Administered 2017-05-02: 10 mg via INTRAVENOUS
  Administered 2017-05-02 (×2): 25 mg via INTRAVENOUS

## 2017-05-02 MED ORDER — SUGAMMADEX SODIUM 500 MG/5ML IV SOLN
INTRAVENOUS | Status: AC
  Filled 2017-05-02: qty 5

## 2017-05-02 MED ORDER — OXYCODONE-ACETAMINOPHEN 5-325 MG PO TABS
1.0000 | ORAL_TABLET | ORAL | Status: DC | PRN
  Administered 2017-05-02 – 2017-05-03 (×4): 1 via ORAL
  Filled 2017-05-02 (×4): qty 1

## 2017-05-02 MED ORDER — ROCURONIUM BROMIDE 10 MG/ML (PF) SYRINGE
PREFILLED_SYRINGE | INTRAVENOUS | Status: AC
  Filled 2017-05-02: qty 10

## 2017-05-02 MED ORDER — HYDROMORPHONE HCL 1 MG/ML IJ SOLN
1.0000 mg | INTRAMUSCULAR | Status: DC | PRN
  Administered 2017-05-02 – 2017-05-03 (×2): 1 mg via INTRAVENOUS
  Filled 2017-05-02 (×2): qty 1

## 2017-05-02 MED ORDER — OXYCODONE HCL 5 MG PO TABS
5.0000 mg | ORAL_TABLET | ORAL | Status: DC | PRN
Start: 2017-05-02 — End: 2017-05-03
  Administered 2017-05-02 – 2017-05-03 (×4): 5 mg via ORAL
  Filled 2017-05-02 (×4): qty 1

## 2017-05-02 MED ORDER — OXYCODONE-ACETAMINOPHEN 10-325 MG PO TABS: 1.0000 | ORAL_TABLET | ORAL | Status: DC | PRN

## 2017-05-02 MED ORDER — DEXTROSE 5 % IV SOLN
INTRAVENOUS | Status: DC | PRN
  Administered 2017-05-02: 30 ug/min via INTRAVENOUS

## 2017-05-02 MED ORDER — THROMBIN (RECOMBINANT) 20000 UNITS EX SOLR
CUTANEOUS | Status: AC
  Filled 2017-05-02: qty 20000

## 2017-05-02 MED ORDER — BUPIVACAINE HCL (PF) 0.25 % IJ SOLN
INTRAMUSCULAR | Status: AC
  Filled 2017-05-02: qty 30

## 2017-05-02 MED ORDER — FENTANYL CITRATE (PF) 250 MCG/5ML IJ SOLN
INTRAMUSCULAR | Status: AC
  Filled 2017-05-02: qty 5

## 2017-05-02 MED ORDER — FENTANYL CITRATE (PF) 250 MCG/5ML IJ SOLN
INTRAMUSCULAR | Status: DC | PRN
  Administered 2017-05-02: 50 ug via INTRAVENOUS
  Administered 2017-05-02: 150 ug via INTRAVENOUS
  Administered 2017-05-02: 50 ug via INTRAVENOUS

## 2017-05-02 MED ORDER — MIDAZOLAM HCL 2 MG/2ML IJ SOLN
INTRAMUSCULAR | Status: AC
  Filled 2017-05-02: qty 2

## 2017-05-02 MED ORDER — MENTHOL 3 MG MT LOZG: 1.0000 | LOZENGE | OROMUCOSAL | Status: DC | PRN

## 2017-05-02 MED ORDER — THROMBIN (RECOMBINANT) 5000 UNITS EX SOLR
OROMUCOSAL | Status: DC | PRN
  Administered 2017-05-02: 09:00:00 via TOPICAL

## 2017-05-02 MED ORDER — SODIUM CHLORIDE 0.9% FLUSH
3.0000 mL | Freq: Two times a day (BID) | INTRAVENOUS | Status: DC
  Administered 2017-05-02 (×2): 3 mL via INTRAVENOUS

## 2017-05-02 MED ORDER — SENNA 8.6 MG PO TABS
1.0000 | ORAL_TABLET | Freq: Two times a day (BID) | ORAL | Status: DC
  Administered 2017-05-02 – 2017-05-03 (×3): 8.6 mg via ORAL
  Filled 2017-05-02 (×3): qty 1

## 2017-05-02 MED ORDER — SODIUM CHLORIDE 0.9% FLUSH: 3.0000 mL | INTRAVENOUS | Status: DC | PRN

## 2017-05-02 MED ORDER — BACITRACIN ZINC 500 UNIT/GM EX OINT
TOPICAL_OINTMENT | CUTANEOUS | Status: AC
  Filled 2017-05-02: qty 28.35

## 2017-05-02 MED ORDER — ONDANSETRON HCL 4 MG PO TABS: 4.0000 mg | ORAL_TABLET | Freq: Four times a day (QID) | ORAL | Status: DC | PRN

## 2017-05-02 MED ORDER — LIDOCAINE 2% (20 MG/ML) 5 ML SYRINGE
INTRAMUSCULAR | Status: AC
  Filled 2017-05-02: qty 5

## 2017-05-02 MED ORDER — PHENOL 1.4 % MT LIQD: 1.0000 | OROMUCOSAL | Status: DC | PRN

## 2017-05-02 MED ORDER — HYDROMORPHONE HCL 1 MG/ML IJ SOLN
0.2500 mg | INTRAMUSCULAR | Status: DC | PRN
  Administered 2017-05-02 (×4): 0.5 mg via INTRAVENOUS

## 2017-05-02 MED ORDER — OXYCODONE-ACETAMINOPHEN 5-325 MG PO TABS
ORAL_TABLET | ORAL | Status: AC
  Administered 2017-05-02: 2
  Filled 2017-05-02: qty 2

## 2017-05-02 MED ORDER — LOSARTAN POTASSIUM 50 MG PO TABS
100.0000 mg | ORAL_TABLET | Freq: Every day | ORAL | Status: DC
  Administered 2017-05-02 – 2017-05-03 (×2): 100 mg via ORAL
  Filled 2017-05-02 (×2): qty 2

## 2017-05-02 MED ORDER — ONDANSETRON HCL 4 MG/2ML IJ SOLN
INTRAMUSCULAR | Status: DC | PRN
  Administered 2017-05-02: 4 mg via INTRAVENOUS

## 2017-05-02 MED ORDER — ARTIFICIAL TEARS OPHTHALMIC OINT
TOPICAL_OINTMENT | OPHTHALMIC | Status: AC
  Filled 2017-05-02: qty 3.5

## 2017-05-02 MED ORDER — 0.9 % SODIUM CHLORIDE (POUR BTL) OPTIME
TOPICAL | Status: DC | PRN
  Administered 2017-05-02: 1000 mL

## 2017-05-02 MED ORDER — BUPIVACAINE HCL (PF) 0.25 % IJ SOLN
INTRAMUSCULAR | Status: DC | PRN
  Administered 2017-05-02: 7 mL
  Administered 2017-05-02: 6 mL

## 2017-05-02 SURGICAL SUPPLY — 77 items
BAG DECANTER FOR FLEXI CONT (MISCELLANEOUS) ×3 IMPLANT
BANDAGE ACE 4X5 VEL STRL LF (GAUZE/BANDAGES/DRESSINGS) ×3 IMPLANT
BENZOIN TINCTURE PRP APPL 2/3 (GAUZE/BANDAGES/DRESSINGS) ×3 IMPLANT
BIT DRILL 3.5 SHORT (BIT) ×3 IMPLANT
BLADE CLIPPER SURG (BLADE) IMPLANT
BLADE SURG 15 STRL LF DISP TIS (BLADE) ×2 IMPLANT
BLADE SURG 15 STRL SS (BLADE) ×1
BNDG GAUZE ELAST 4 BULKY (GAUZE/BANDAGES/DRESSINGS) ×3 IMPLANT
BUR MATCHSTICK NEURO 3.0 LAGG (BURR) ×3 IMPLANT
CABLE BIPOLOR RESECTION CORD (MISCELLANEOUS) ×3 IMPLANT
CANISTER SUCT 3000ML PPV (MISCELLANEOUS) ×6 IMPLANT
CAP LOCKING (Cap) ×12 IMPLANT
CARTRIDGE OIL MAESTRO DRILL (MISCELLANEOUS) ×4 IMPLANT
DIFFUSER DRILL AIR PNEUMATIC (MISCELLANEOUS) ×6 IMPLANT
DRAPE C-ARM 42X72 X-RAY (DRAPES) ×6 IMPLANT
DRAPE EXTREMITY T 121X128X90 (DRAPE) ×3 IMPLANT
DRAPE HALF SHEET 40X57 (DRAPES) ×3 IMPLANT
DRAPE LAPAROTOMY 100X72 PEDS (DRAPES) ×3 IMPLANT
DRAPE MICROSCOPE LEICA (MISCELLANEOUS) ×3 IMPLANT
DRAPE POUCH INSTRU U-SHP 10X18 (DRAPES) ×3 IMPLANT
DRSG ADAPTIC 3X8 NADH LF (GAUZE/BANDAGES/DRESSINGS) ×3 IMPLANT
DRSG OPSITE POSTOP 4X6 (GAUZE/BANDAGES/DRESSINGS) ×3 IMPLANT
DURAPREP 26ML APPLICATOR (WOUND CARE) ×6 IMPLANT
ELECT BLADE 4.0 EZ CLEAN MEGAD (MISCELLANEOUS) ×3
ELECT REM PT RETURN 9FT ADLT (ELECTROSURGICAL) ×3
ELECTRODE BLDE 4.0 EZ CLN MEGD (MISCELLANEOUS) ×2 IMPLANT
ELECTRODE REM PT RTRN 9FT ADLT (ELECTROSURGICAL) ×2 IMPLANT
EVACUATOR 1/8 PVC DRAIN (DRAIN) ×3 IMPLANT
GAUZE SPONGE 4X4 12PLY STRL (GAUZE/BANDAGES/DRESSINGS) ×3 IMPLANT
GAUZE SPONGE 4X4 16PLY XRAY LF (GAUZE/BANDAGES/DRESSINGS) ×3 IMPLANT
GLOVE BIO SURGEON STRL SZ7 (GLOVE) ×3 IMPLANT
GLOVE BIO SURGEON STRL SZ8 (GLOVE) ×6 IMPLANT
GLOVE BIOGEL PI IND STRL 7.0 (GLOVE) ×2 IMPLANT
GLOVE BIOGEL PI INDICATOR 7.0 (GLOVE) ×1
GLOVE ECLIPSE 6.5 STRL STRAW (GLOVE) ×3 IMPLANT
GLOVE INDICATOR 7.0 STRL GRN (GLOVE) ×3 IMPLANT
GLOVE INDICATOR 7.5 STRL GRN (GLOVE) ×3 IMPLANT
GOWN STRL REUS W/ TWL LRG LVL3 (GOWN DISPOSABLE) ×2 IMPLANT
GOWN STRL REUS W/ TWL XL LVL3 (GOWN DISPOSABLE) ×4 IMPLANT
GOWN STRL REUS W/TWL 2XL LVL3 (GOWN DISPOSABLE) ×3 IMPLANT
GOWN STRL REUS W/TWL LRG LVL3 (GOWN DISPOSABLE) ×1
GOWN STRL REUS W/TWL XL LVL3 (GOWN DISPOSABLE) ×2
HEMOSTAT POWDER KIT SURGIFOAM (HEMOSTASIS) ×3 IMPLANT
IMPL QUARTEX 3.5X14MM (Neuro Prosthesis/Implant) ×12 IMPLANT
IMPLANT QUARTEX 3.5X14MM (Neuro Prosthesis/Implant) ×18 IMPLANT
KIT BASIN OR (CUSTOM PROCEDURE TRAY) ×3 IMPLANT
KIT ROOM TURNOVER OR (KITS) ×3 IMPLANT
LOCKING CAP (Cap) ×18 IMPLANT
MILL MEDIUM DISP (BLADE) ×3 IMPLANT
NEEDLE HYPO 22GX1.5 SAFETY (NEEDLE) ×3 IMPLANT
NEEDLE HYPO 25X1 1.5 SAFETY (NEEDLE) ×3 IMPLANT
NEEDLE SPNL 20GX3.5 QUINCKE YW (NEEDLE) IMPLANT
NS IRRIG 1000ML POUR BTL (IV SOLUTION) ×6 IMPLANT
OIL CARTRIDGE MAESTRO DRILL (MISCELLANEOUS) ×6
PACK LAMINECTOMY NEURO (CUSTOM PROCEDURE TRAY) ×3 IMPLANT
PACK SURGICAL SETUP 50X90 (CUSTOM PROCEDURE TRAY) ×3 IMPLANT
PAD ARMBOARD 7.5X6 YLW CONV (MISCELLANEOUS) ×6 IMPLANT
PIN MAYFIELD SKULL DISP (PIN) ×3 IMPLANT
ROD SPINE STR 4.0X80 (Rod) ×3 IMPLANT
RUBBERBAND STERILE (MISCELLANEOUS) ×6 IMPLANT
SPONGE LAP 4X18 X RAY DECT (DISPOSABLE) IMPLANT
SPONGE SURGIFOAM ABS GEL 100 (HEMOSTASIS) ×3 IMPLANT
STOCKINETTE 4X48 STRL (DRAPES) ×3 IMPLANT
STRIP CLOSURE SKIN 1/2X4 (GAUZE/BANDAGES/DRESSINGS) ×3 IMPLANT
SUT ETHILON 4 0 PS 2 18 (SUTURE) ×3 IMPLANT
SUT VIC AB 0 CT1 18XCR BRD8 (SUTURE) ×4 IMPLANT
SUT VIC AB 0 CT1 8-18 (SUTURE) ×2
SUT VIC AB 2-0 CP2 18 (SUTURE) ×3 IMPLANT
SUT VIC AB 3-0 SH 8-18 (SUTURE) ×9 IMPLANT
SWABSTICK BENZOIN STERILE (MISCELLANEOUS) ×3 IMPLANT
SYR BULB 3OZ (MISCELLANEOUS) ×3 IMPLANT
SYR CONTROL 10ML LL (SYRINGE) ×3 IMPLANT
TOWEL GREEN STERILE (TOWEL DISPOSABLE) ×6 IMPLANT
TOWEL GREEN STERILE FF (TOWEL DISPOSABLE) ×6 IMPLANT
TUBE CONNECTING 12X1/4 (SUCTIONS) ×3 IMPLANT
UNDERPAD 30X30 (UNDERPADS AND DIAPERS) ×3 IMPLANT
WATER STERILE IRR 1000ML POUR (IV SOLUTION) ×6 IMPLANT

## 2017-05-02 NOTE — Anesthesia Postprocedure Evaluation (Signed)
Anesthesia Post Note  Patient: [name]  Procedure(s) Performed: Cervical three-six Posterior cervical fusion with lateral mass fixation, Cervical three-four laminectomy (N/A ) LEFT CARPAL TUNNEL RELEASE (Left )     Patient location during evaluation: PACU Anesthesia Type: General Level of consciousness: awake and alert Pain management: pain level controlled Vital Signs Assessment: post-procedure vital signs reviewed and stable Respiratory status: spontaneous breathing, nonlabored ventilation, respiratory function stable and patient connected to nasal cannula oxygen Cardiovascular status: blood pressure returned to baseline and stable Postop Assessment: no apparent nausea or vomiting Anesthetic complications: no    Last Vitals:  Vitals:   05/02/17 1415 05/02/17 1511  BP: (!) 158/92 (!) 163/88  Pulse: (!) 111 (!) 117  Resp: 17 18  Temp:  36.8 C  SpO2: 94% 97%    Last Pain:  Vitals:   05/02/17 1430  TempSrc:   PainSc: 3                  Leonela Kivi P Sharnetta Gielow

## 2017-05-02 NOTE — Anesthesia Procedure Notes (Signed)
Procedure Name: Intubation Date/Time: 05/02/2017 9:37 AM Performed by: Izola Priceockfield, Letcher Schweikert Walton Jr., CRNA Pre-anesthesia Checklist: Patient identified, Emergency Drugs available, Suction available and Patient being monitored Patient Re-evaluated:Patient Re-evaluated prior to induction Oxygen Delivery Method: Circle system utilized Preoxygenation: Pre-oxygenation with 100% oxygen Induction Type: IV induction Ventilation: Mask ventilation without difficulty and Oral airway inserted - appropriate to patient size Laryngoscope Size: Glidescope and 4 Grade View: Grade I Tube type: Oral Tube size: 8.0 mm Number of attempts: 1 Airway Equipment and Method: Stylet and Oral airway Placement Confirmation: ETT inserted through vocal cords under direct vision,  positive ETCO2 and breath sounds checked- equal and bilateral Secured at: 23 cm Tube secured with: Tape Dental Injury: Teeth and Oropharynx as per pre-operative assessment

## 2017-05-02 NOTE — Anesthesia Preprocedure Evaluation (Addendum)
Anesthesia Evaluation  Patient identified by MRN, date of birth, ID band Patient awake    Reviewed: Allergy & Precautions, NPO status , Patient's Chart, lab work & pertinent test results  Airway Mallampati: III  TM Distance: >3 FB Neck ROM: Full    Dental no notable dental hx.    Pulmonary sleep apnea and Continuous Positive Airway Pressure Ventilation ,    Pulmonary exam normal breath sounds clear to auscultation       Cardiovascular hypertension, Pt. on medications Normal cardiovascular exam Rhythm:Regular Rate:Normal  ECG: NSR, rate 79  ECHO 2008: EF 55-60%   Neuro/Psych negative neurological ROS  negative psych ROS   GI/Hepatic negative GI ROS, Neg liver ROS,   Endo/Other  Morbid obesity  Renal/GU negative Renal ROS     Musculoskeletal  (+) Arthritis , Chronic lower back pain   Abdominal (+) + obese,   Peds  Hematology HLD   Anesthesia Other Findings Spinal stenosis Cervical region  Reproductive/Obstetrics                            Anesthesia Physical  Anesthesia Plan  ASA: III  Anesthesia Plan: General   Post-op Pain Management:    Induction: Intravenous  PONV Risk Score and Plan: 2 and Ondansetron, Dexamethasone, Midazolam and Treatment may vary due to age or medical condition  Airway Management Planned: Oral ETT and Video Laryngoscope Planned  Additional Equipment:   Intra-op Plan:   Post-operative Plan: Extubation in OR  Informed Consent: I have reviewed the patients History and Physical, chart, labs and discussed the procedure including the risks, benefits and alternatives for the proposed anesthesia with the patient or authorized representative who has indicated his/her understanding and acceptance.   Dental advisory given  Plan Discussed with: CRNA  Anesthesia Plan Comments:        Anesthesia Quick Evaluation

## 2017-05-02 NOTE — Evaluation (Signed)
Physical Therapy Evaluation Patient Details Name: [name] MRN: [id number] DOB: 06-20-1955 Today's Date: 05/02/2017   History of Present Illness  Patient is a 62 yo male s/p CL/ PCF C3-6. Left Carpal Tunnel Release  Clinical Impression  Orders received for PT evaluation. Patient demonstrates deficits in functional mobility as indicated below. Will benefit from continued skilled PT to address deficits and maximize function. Will see as indicated and progress as tolerated.      Follow Up Recommendations No PT follow up    Equipment Recommendations  None recommended by PT    Recommendations for Other Services       Precautions / Restrictions Precautions Precautions: Cervical Precaution Comments: verbally reviewed hand out with patient      Mobility  Bed Mobility Overal bed mobility: Needs Assistance Bed Mobility: Sit to Supine       Sit to supine: Supervision   General bed mobility comments: supervision for safety with cues for positioning and technique  Transfers Overall transfer level: Needs assistance   Transfers: Sit to/from Stand Sit to Stand: Supervision         General transfer comment: supervision for safety with power up standing  Ambulation/Gait Ambulation/Gait assistance: Min guard Ambulation Distance (Feet): 180 Feet Assistive device: None Gait Pattern/deviations: Step-through pattern;Decreased stride length;Drifts right/left Gait velocity: decreased Gait velocity interpretation: Below normal speed for age/gender General Gait Details: some instability noted with ambulation but overall mobilizing well  Stairs            Wheelchair Mobility    Modified Rankin (Stroke Patients Only)       Balance Overall balance assessment: Needs assistance Sitting-balance support: Feet supported Sitting balance-Leahy Scale: Good     Standing balance support: During functional activity Standing balance-Leahy Scale: Fair                                Pertinent Vitals/Pain Pain Assessment: Faces Faces Pain Scale: Hurts little more Pain Location: posterior neck pain at surgical site Pain Descriptors / Indicators: Sore Pain Intervention(s): Monitored during session    Home Living Family/patient expects to be discharged to:: Private residence Living Arrangements: Spouse/significant other Available Help at Discharge: Family Type of Home: House Home Access: Stairs to enter Entrance Stairs-Rails: Left Entrance Stairs-Number of Steps: 3 Home Layout: One level Home Equipment: Environmental consultant - 2 wheels;Cane - single point;Crutches;Bedside commode;Cane - quad;Shower seat      Prior Function Level of Independence: Independent               Hand Dominance   Dominant Hand: Right    Extremity/Trunk Assessment   Upper Extremity Assessment Upper Extremity Assessment: LUE deficits/detail LUE Deficits / Details: left carpal tunnel release    Lower Extremity Assessment Lower Extremity Assessment: Overall WFL for tasks assessed    Cervical / Trunk Assessment Cervical / Trunk Assessment: (s/p cervical spine surgery)  Communication   Communication: No difficulties  Cognition Arousal/Alertness: Awake/alert Behavior During Therapy: WFL for tasks assessed/performed Overall Cognitive Status: Within Functional Limits for tasks assessed                                        General Comments      Exercises     Assessment/Plan    PT Assessment Patient needs continued PT services  PT Problem List Decreased balance;Decreased mobility;Obesity;Pain  PT Treatment Interventions DME instruction;Gait training;Stair training;Functional mobility training;Therapeutic activities;Therapeutic exercise;Balance training;Patient/family education    PT Goals (Current goals can be found in the Care Plan section)  Acute Rehab PT Goals Patient Stated Goal: to go home PT Goal Formulation: With patient Time  For Goal Achievement: 05/16/17 Potential to Achieve Goals: Good    Frequency Min 5X/week   Barriers to discharge        Co-evaluation               AM-PAC PT "6 Clicks" Daily Activity  Outcome Measure Difficulty turning over in bed (including adjusting bedclothes, sheets and blankets)?: A Little Difficulty moving from lying on back to sitting on the side of the bed? : A Little Difficulty sitting down on and standing up from a chair with arms (e.g., wheelchair, bedside commode, etc,.)?: A Little Help needed moving to and from a bed to chair (including a wheelchair)?: A Little Help needed walking in hospital room?: A Little Help needed climbing 3-5 steps with a railing? : A Lot 6 Click Score: 17    End of Session Equipment Utilized During Treatment: Oxygen Activity Tolerance: Patient tolerated treatment well Patient left: in bed;with call bell/phone within reach;with SCD's reapplied Nurse Communication: Mobility status PT Visit Diagnosis: Pain Pain - Right/Left: Left Pain - part of body: Hand    Time: [id number] PT Time Calculation (min) (ACUTE ONLY): 17 min   Charges:   PT Evaluation $PT Eval Moderate Complexity: 1 Mod     PT G Codes:        Charlotte Crumbevon Aleenah Homen, PT DPT  Board Certified Neurologic Specialist [phone]   Fabio AsaDevon J Izaiyah Kleinman 05/02/2017, 3:43 PM

## 2017-05-02 NOTE — H&P (Signed)
Subjective:   Patient is a 62 y.o. male admitted for l CTS and cervical stenosis. The patient first presented to me with complaints of neck pain and numbness of the arm(s). Onset of symptoms was several months ago. The pain is described as aching and occurs all day. The pain is rated moderate, and is located in the neck and radiates to the arms. The symptoms have been progressive. Symptoms are exacerbated by extending head backwards, and are relieved by none.  Previous work up includes MRI of cervical spine, results: spinal stenosis. EMGs showed L CTS  Past Medical History:  Diagnosis Date  . Arthritis    back  . Chronic lower back pain   . Hyperlipemia   . Hypertension   . OSA on CPAP    settings 14  . Right hydrocele   . Wears contact lenses     Past Surgical History:  Procedure Laterality Date  . ANTERIOR CERVICAL DECOMP/DISCECTOMY FUSION  1999  . CARDIOVASCULAR STRESS TEST  06-23-2010   normal lexiscan nuclear study/  normal LV function and wall motion, ef 60%  . COLONOSCOPY    . HYDROCELE EXCISION Right 08/25/2015   Procedure: RIGHT HYDROCELE REPAIR;  Surgeon: Barron Alvine, MD;  Location: [facility];  Service: Urology;  Laterality: Right;  . KNEE ARTHROSCOPY Bilateral   . KNEE ARTHROSCOPY W/ SYNOVECTOMY Left 01-29-2007  &  re-do 02-05-2007  . MAXIMUM ACCESS (MAS)POSTERIOR LUMBAR INTERBODY FUSION (PLIF) 2 LEVEL N/A 06/17/2015   Procedure: LUMBAR FOUR-LUMBAR FIVE MAXIMUM ACCESS (MAS) POSTERIOR LUMBAR INTERBODY FUSION (PLIF) WITH LUMBAR ONE-TWO, LUMBAR FOUR-FIVE LAMINECTOMY ;  Surgeon: Tia Alert, MD;  Location: [facility];  Service: Neurosurgery;  Laterality: N/A;  . SCROTAL EXPLORATION N/A 08/25/2015   Procedure: SCROTUM EXPLORATION;  Surgeon: Barron Alvine, MD;  Location: [facility];  Service: Urology;  Laterality: N/A;  . SPERMATOCELECTOMY N/A 08/25/2015   Procedure: SPERMATOCELE EXCISION;  Surgeon: Barron Alvine, MD;  Location: [facility];  Service: Urology;  Laterality: N/A;  . TONSILLECTOMY  as child  . TOTAL HIP ARTHROPLASTY  05/01/2011   Procedure: TOTAL HIP ARTHROPLASTY;  Surgeon: Nestor Lewandowsky;  Location: [facility];  Service: Orthopedics;  Laterality: Left;  DEPUY  . TOTAL HIP ARTHROPLASTY Right 1995  . TOTAL KNEE ARTHROPLASTY Bilateral right 04-13-2008/  left 03-18-2007  . TRANSTHORACIC ECHOCARDIOGRAM  11-20-2006   mild LVH, 55-60%/  mild AV sclerosis without stenosis/  trace MR    No Known Allergies  Social History   Tobacco Use  . Smoking status: Never Smoker  . Smokeless tobacco: Never Used  Substance Use Topics  . Alcohol use: No    Alcohol/week: 0.0 oz    History reviewed. No pertinent family history. Prior to Admission medications   Medication Sig Start Date End Date Taking? Authorizing Provider  aspirin 325 MG EC tablet Take 325 mg by mouth daily.   Yes [provider]  atorvastatin (LIPITOR) 40 MG tablet Take 40 mg by mouth every morning.    Yes [provider]  celecoxib (CELEBREX) 200 MG capsule Take 400 mg by mouth daily. Takes 2 tablets   Yes [provider]  Cholecalciferol 2000 units CAPS Take 4,000 Units by mouth daily. Takes 2 tablets   Yes [provider]  Coenzyme Q10 (COQ10) 100 MG CAPS Take 200 mg by mouth daily. Takes 2 tablets   Yes [provider]  diphenhydramine-acetaminophen (TYLENOL PM) 25-500 MG TABS Take 2 tablets by mouth at  bedtime.    Yes [provider]  docusate sodium (COLACE) 100 MG capsule Take 300 mg by mouth daily. Takes 3 tablets   Yes [provider]  losartan (COZAAR) 100 MG tablet Take 100 mg by mouth daily.   Yes [provider]  methocarbamol (ROBAXIN) 750 MG tablet Take 325 mg by mouth every 8 (eight) hours. Takes 0.5 tablet 05/15/15  Yes [provider]  Multiple Vitamins-Minerals (MULTIVITAMINS THER. W/MINERALS) TABS Take 1 tablet by mouth daily.     Yes [provider]   oxyCODONE-acetaminophen (PERCOCET) 10-325 MG tablet Take 1 tablet by mouth every 6 (six) hours as needed for pain. 06/20/15  Yes Coletta Memos, MD  oxymetazoline (AFRIN) 0.05 % nasal spray Place 1 spray into both nostrils at bedtime as needed for congestion.   Yes [provider]  sildenafil (VIAGRA) 100 MG tablet Take 100 mg by mouth as needed for erectile dysfunction.   Yes [provider]  oxyCODONE-acetaminophen (ROXICET) 5-325 MG tablet Take 1-2 tablets by mouth every 6 (six) hours as needed. Patient not taking: Reported on 04/23/2017 08/25/15   Barron Alvine, MD     Review of Systems  Positive ROS: neg  All other systems have been reviewed and were otherwise negative with the exception of those mentioned in the HPI and as above.  Objective: Vital signs in last 24 hours: Temp:  [98.3 F (36.8 C)] 98.3 F (36.8 C) (01/16 0748) Pulse Rate:  [81] 81 (01/16 0748) Resp:  [20] 20 (01/16 0748) BP: (187)/(92) 187/92 (01/16 0748) SpO2:  [98 %] 98 % (01/16 0748) Weight:  [140.6 kg (310 lb)] 140.6 kg (310 lb) (01/16 0748)  General Appearance: Alert, cooperative, no distress, appears stated age Head: Normocephalic, without obvious abnormality, atraumatic Eyes: PERRL, conjunctiva/corneas clear, EOM's intact      Neck: Supple, symmetrical, trachea midline, Back: Symmetric, no curvature, ROM normal, no CVA tenderness Lungs:  respirations unlabored Heart: Regular rate and rhythm Abdomen: Soft, non-tender Extremities: Extremities normal, atraumatic, no cyanosis or edema Pulses: 2+ and symmetric all extremities Skin: Skin color, texture, turgor normal, no rashes or lesions  NEUROLOGIC:  Mental status: Alert and oriented x4, no aphasia, good attention span, fund of knowledge and memory  Motor Exam - grossly normal Sensory Exam - grossly normal Reflexes: trace Coordination - grossly normal Gait - grossly normal Balance - grossly normal Cranial Nerves: I: smell Not tested   II: visual acuity  OS: nl    OD: nl  II: visual fields Full to confrontation  II: pupils Equal, round, reactive to light  III,VII: ptosis None  III,IV,VI: extraocular muscles  Full ROM  V: mastication Normal  V: facial light touch sensation  Normal  V,VII: corneal reflex  Present  VII: facial muscle function - upper  Normal  VII: facial muscle function - lower Normal  VIII: hearing Not tested  IX: soft palate elevation  Normal  IX,X: gag reflex Present  XI: trapezius strength  5/5  XI: sternocleidomastoid strength 5/5  XI: neck flexion strength  5/5  XII: tongue strength  Normal    Data Review Lab Results  Component Value Date   WBC 10.9 (H) 04/27/2017   HGB 14.2 04/27/2017   HCT 42.5 04/27/2017   MCV 86.2 04/27/2017   PLT 229 04/27/2017   Lab Results  Component Value Date   NA 138 04/27/2017   K 4.2 04/27/2017   CL 104 04/27/2017   CO2 27 04/27/2017   BUN 16 04/27/2017  CREATININE 0.82 04/27/2017   GLUCOSE 123 (H) 04/27/2017   Lab Results  Component Value Date   INR 0.95 06/10/2015    Assessment:   Cervical neck pain with herniated nucleus pulposus/ spondylosis/ stenosis at C3-5, pseudoarthrosis C5-6. Patient has failed conservative therapy. Planned surgery : CL/ PCF C3-6. L CTR  Plan:   I explained the condition and procedure to the patient and answered any questions.  Patient wishes to proceed with procedure as planned. Understands risks/ benefits/ and expected or typical outcomes.  Cande Mastropietro,Itai S 05/02/2017 9:17 AM

## 2017-05-02 NOTE — Transfer of Care (Signed)
Immediate Anesthesia Transfer of Care Note  Patient: [name]  Procedure(s) Performed: Cervical three-six Posterior cervical fusion with lateral mass fixation, Cervical three-four laminectomy (N/A ) LEFT CARPAL TUNNEL RELEASE (Left )  Patient Location: PACU  Anesthesia Type:General  Level of Consciousness: awake, alert  and oriented  Airway & Oxygen Therapy: Patient Spontanous Breathing and Patient connected to face mask oxygen  Post-op Assessment: Report given to RN and Post -op Vital signs reviewed and stable  Post vital signs: Reviewed and stable  Last Vitals:  Vitals:   05/02/17 0748 05/02/17 1300  BP: (!) 187/92   Pulse: 81   Resp: 20   Temp: 36.8 C 36.9 C  SpO2: 98%     Last Pain:  Vitals:   05/02/17 0749  TempSrc:   PainSc: 5          Complications: No apparent anesthesia complications

## 2017-05-02 NOTE — Op Note (Signed)
05/02/2017  12:55 PM  PATIENT:  [name]  62 y.o. male  PRE-OPERATIVE DIAGNOSIS:  Cervical spinal stenosis C3-4 C4-5, pseudoarthrosis C5-6, left carpal tunnel syndrome, neck and arm pain  POST-OPERATIVE DIAGNOSIS:  same  PROCEDURE:  1. Decompressive cervical laminectomy and medial facetectomy and foraminotomy C3-4 C4-5, 2. Posterior cervical fusion C3-C6 utilizing locally harvested morselized autologous bone graft, 3. Segmental lateral mass fixation C3-C6 utilizing globus lateral mass screws, 4. Left carpal tunnel release  SURGEON:  Marikay Alar, MD  ASSISTANTS: Dr Franky Macho  ANESTHESIA:   General  EBL: 600 ml  Total I/O In: 1250 [I.V.:1000; IV Piggyback:250] Out: 600 [Blood:600]  BLOOD ADMINISTERED: none  DRAINS: med hemovac  SPECIMEN:  none  INDICATION FOR PROCEDURE: This patient presented with left hand numbness, neck and arm pain, numbness in the arms. Imaging showed spinal stenosis C3-4 C4-5 and probable pseudoarthrosis C5-6 after previous 2 level ACDF done elsewhere. Nerve conduction study showed a severe left carpal tunnel syndrome The patient tried conservative measures without relief. Pain was debilitating. Recommended posterior cervical decompression and fusion C3-C6, and left carpal tunnel release. Patient understood the risks, benefits, and alternatives and potential outcomes and wished to proceed.  PROCEDURE DETAILS: The patient was brought to the operating room. Generalized endotracheal anesthesia was induced. The patient's left arm was extended on an arm board and prepped sequentially from the fingertips to the elbow utilizing Betadine scrub and DuraPrep. The hand and arm were then draped in usual sterile fashion. 7 mL of local anesthesia was injected and a small palmar incision was made in the distal wrist creased into the palm in line with the middle finger. I dissected down through the palmar fascia to identify the transverse carpal ligament. The transverse carpal  ligament was opened with a 15 blade scalpel to identify the underlying median nerve. I then spread between the ligament and the nerve and completely transected the ligament both proximally and distally until the nerve was free. I then palpated both proximally and distally with a hemostat. I irrigated with saline solution containing bacitracin. I got all bleeding points. Once meticulous position was achieved I closed the palmar fascia with a single 3-0 Vicryl. I closed the subcutaneous tissues with 2-0 Vicryl. I then closed the skin with interrupted 4-0 Ethilon vertical mattress sutures. The wound was then cleaned and dressed and the hand was wrapped in a Kerlix and Ace bandage. The patient was then rolled back onto the stretcher to appear for posterior cervical decompression and fusion. The patient was affixed a 3 point [place]field headrest and rolled into the prone position on chest rolls. All pressure points were padded. The posterior cervical region was cleaned with Betadine scrub and prepped with DuraPrep and then draped in the usual sterile fashion. 7 cc of local anesthesia was injected and a dorsal midline incision made in the posterior cervical region and carried down to the cervical fascia. The fascia was opened and the paraspinous musculature was taken down to expose C3-C6. Intraoperative fluoroscopy confirmed my level and then the dissection was carried out over the lateral facets. I localized the midpoint of each lateral mass and marked a region 1 mm medial to the midpoint of the lateral mass, and then drilled in an upward and outward direction into the safe zone of each lateral mass. I drilled to a depth of 14 mm and then checked my drill hole with a ball probe. I then placed a 14 mm lateral mass screws into the safe zone of each  lateral mass until they were 2 fingers tight from C3-C6 on the right and C3 to C5 on the left. I then gently decompressed the central canal with the 1 and 2 mm Kerrison punch from  the top of C5 to midportion of C3. Medial facetectomies were performed, and foraminotomies were performed at C4-5. Once the decompression was complete the dura was full and capacious and I could see the spinal cord pulsatile through the dura. I then decorticated the lateral masses and the facet joints and packed them with local autograft to perform arthrodesis from C3-C6. I then placed rods into the multiaxial screw heads of the screws and locked these into position with the locking caps and anti-torque device. I then checked the final construct with AP/Lat fluoroscopy. I irrigated with saline solution containing bacitracin. I placed a medium Hemovac drain through separate stab incision, and lined the dura with Gelfoam. After hemostasis was achieved I closed the muscle and the fascia with 0 Vicryl, subcutaneous tissue with 2-0 Vicryl, and the subcuticular tissue with 3-0 Vicryl. The skin was closed with Dermabond, benzoin and Steri-Strips. A sterile dressing was applied, the patient was turned to the supine position and taken out of the headrest, awakened from general anesthesia and transferred to the recovery room in stable condition. At the end of the procedure all sponge, needle and instrument counts were correct.    PLAN OF CARE: Admit to inpatient   PATIENT DISPOSITION:  PACU - hemodynamically stable.   Delay start of Pharmacological VTE agent (>24hrs) due to surgical blood loss or risk of bleeding:  yes

## 2017-05-03 ENCOUNTER — Other Ambulatory Visit: Payer: Self-pay

## 2017-05-03 MED ORDER — OXYCODONE-ACETAMINOPHEN 5-325 MG PO TABS: 1.0000 | ORAL_TABLET | ORAL | 0 refills | Status: DC | PRN

## 2017-05-03 MED ORDER — METHOCARBAMOL 500 MG PO TABS: 500.0000 mg | ORAL_TABLET | Freq: Four times a day (QID) | ORAL | 0 refills | Status: DC | PRN

## 2017-05-03 MED ORDER — LUBIPROSTONE 8 MCG PO CAPS: 8.0000 ug | ORAL_CAPSULE | Freq: Two times a day (BID) | ORAL | 0 refills | Status: DC

## 2017-05-03 NOTE — Discharge Instructions (Signed)
jon

## 2017-05-03 NOTE — Discharge Summary (Signed)
Physician Discharge Summary  Patient ID: [name] MRN: [id number] DOB/AGE: [birth year] 62 y.o.  Admit date: 05/02/2017 Discharge date: 05/03/2017  Admission Diagnoses: Cervical spinal stenosis C3-4 C4-5, pseudoarthrosis C5-6, left carpal tunnel syndrome, neck and arm pain  Discharge Diagnoses: same as admitting   Discharged Condition: good  Hospital Course: The patient was admitted on 05/02/2017 and taken to the operating room where the patient underwent Decompressive cervical laminectomy and medial facetectomy and foraminotomy C3-4 C4-5, 2. Posterior cervical fusion C3-C6, Segmental lateral mass fixation C3-C6, left carpal tunnel release. The patient tolerated the procedure well and was taken to the recovery room and then to the floor in stable condition. The hospital course was routine. There were no complications. The wound remained clean dry and intact. Pt had appropriate neck soreness. No complaints of arm pain or new N/T/W. The patient remained afebrile with stable vital signs, and tolerated a regular diet. The patient continued to increase activities, and pain was well controlled with oral pain medications.   Consults: None  Significant Diagnostic Studies:  Results for orders placed or performed during the hospital encounter of 04/27/17  Surgical pcr screen  Result Value Ref Range   MRSA, PCR NEGATIVE NEGATIVE   Staphylococcus aureus NEGATIVE NEGATIVE  Basic metabolic panel  Result Value Ref Range   Sodium 138 135 - 145 mmol/L   Potassium 4.2 3.5 - 5.1 mmol/L   Chloride 104 101 - 111 mmol/L   CO2 27 22 - 32 mmol/L   Glucose, Bld 123 (H) 65 - 99 mg/dL   BUN 16 6 - 20 mg/dL   Creatinine, Ser 4.09 0.61 - 1.24 mg/dL   Calcium 9.2 8.9 - 81.1 mg/dL   GFR calc non Af Amer >60 >60 mL/min   GFR calc Af Amer >60 >60 mL/min   Anion gap 7 5 - 15  CBC  Result Value Ref Range   WBC 10.9 (H) 4.0 - 10.5 K/uL   RBC 4.93 4.22 - 5.81 MIL/uL   Hemoglobin 14.2 13.0 - 17.0 g/dL   HCT  91.4 78.2 - 95.6 %   MCV 86.2 78.0 - 100.0 fL   MCH 28.8 26.0 - 34.0 pg   MCHC 33.4 30.0 - 36.0 g/dL   RDW 21.3 08.6 - 57.8 %   Platelets 229 150 - 400 K/uL  Type and screen  Result Value Ref Range   ABO/RH(D) A POS    Antibody Screen NEG    Sample Expiration 05/11/2017    Extend sample reason NO TRANSFUSIONS OR PREGNANCY IN THE PAST 3 MONTHS     Dg Cervical Spine 2-3 Views  Result Date: 05/02/2017 CLINICAL DATA:  Status post surgical fusion. EXAM: DG C-ARM 61-120 MIN; CERVICAL SPINE - 2-3 VIEW FLUOROSCOPY TIME:  15 seconds. COMPARISON:  Radiographs of March 07, 2017. FINDINGS: Single intraoperative fluoroscopic image of the cervical spine demonstrates the patient be status post surgical posterior fusion of C3-4. Good alignment of the vertebral bodies is noted. IMPRESSION: Status post surgical posterior fusion of C3-4. Electronically Signed   By: Lupita Raider, M.D.   On: 05/02/2017 14:19   Dg C-arm 1-60 Min  Result Date: 05/02/2017 CLINICAL DATA:  Status post surgical fusion. EXAM: DG C-ARM 61-120 MIN; CERVICAL SPINE - 2-3 VIEW FLUOROSCOPY TIME:  15 seconds. COMPARISON:  Radiographs of March 07, 2017. FINDINGS: Single intraoperative fluoroscopic image of the cervical spine demonstrates the patient be status post surgical posterior fusion of C3-4. Good alignment of the vertebral bodies is noted. IMPRESSION:  Status post surgical posterior fusion of C3-4. Electronically Signed   By: Lupita Raider, M.D.   On: 05/02/2017 14:19    Antibiotics:  Anti-infectives (From admission, onward)   Start     Dose/Rate Route Frequency Ordered Stop   05/02/17 1730  ceFAZolin (ANCEF) IVPB 2g/100 mL premix     2 g 200 mL/hr over 30 Minutes Intravenous Every 8 hours 05/02/17 1316 05/03/17 0237   05/02/17 1115  vancomycin (VANCOCIN) powder  Status:  Discontinued       As needed 05/02/17 1116 05/02/17 1255   05/02/17 0830  bacitracin 50,000 Units in sodium chloride irrigation 0.9 % 500 mL irrigation   Status:  Discontinued       As needed 05/02/17 0955 05/02/17 1255   05/02/17 0730  ceFAZolin (ANCEF) 3 g in dextrose 5 % 50 mL IVPB     3 g 130 mL/hr over 30 Minutes Intravenous To [facility] 05/01/17 1248 05/02/17 0942      Discharge Exam: Blood pressure 109/72, pulse 88, temperature 98.3 F (36.8 C), temperature source Oral, resp. rate 20, height 5' 11.5" (1.816 m), weight (!) 140.6 kg (310 lb), SpO2 94 %. Neurologic: Grossly normal Ambulating and voiding well  Discharge Medications:   Allergies as of 05/03/2017   No Known Allergies     Medication List    TAKE these medications   aspirin 325 MG EC tablet Take 325 mg by mouth daily.   atorvastatin 40 MG tablet Commonly known as:  LIPITOR Take 40 mg by mouth every morning.   celecoxib 200 MG capsule Commonly known as:  CELEBREX Take 400 mg by mouth daily. Takes 2 tablets   Cholecalciferol 2000 units Caps Take 4,000 Units by mouth daily. Takes 2 tablets   CoQ10 100 MG Caps Take 200 mg by mouth daily. Takes 2 tablets   diphenhydramine-acetaminophen 25-500 MG Tabs tablet Commonly known as:  TYLENOL PM Take 2 tablets by mouth at bedtime.   docusate sodium 100 MG capsule Commonly known as:  COLACE Take 300 mg by mouth daily. Takes 3 tablets   losartan 100 MG tablet Commonly known as:  COZAAR Take 100 mg by mouth daily.   lubiprostone 8 MCG capsule Commonly known as:  AMITIZA Take 1 capsule (8 mcg total) by mouth 2 (two) times daily with a meal.   methocarbamol 750 MG tablet Commonly known as:  ROBAXIN Take 325 mg by mouth every 8 (eight) hours. Takes 0.5 tablet What changed:  Another medication with the same name was added. Make sure you understand how and when to take each.   methocarbamol 500 MG tablet Commonly known as:  ROBAXIN Take 1 tablet (500 mg total) by mouth every 6 (six) hours as needed for muscle spasms. What changed:  You were already taking a medication with the same name, and this  prescription was added. Make sure you understand how and when to take each.   multivitamins ther. w/minerals Tabs tablet Take 1 tablet by mouth daily.   oxyCODONE-acetaminophen 10-325 MG tablet Commonly known as:  PERCOCET Take 1 tablet by mouth every 6 (six) hours as needed for pain. What changed:  Another medication with the same name was added. Make sure you understand how and when to take each.   oxyCODONE-acetaminophen 5-325 MG tablet Commonly known as:  ROXICET Take 1-2 tablets by mouth every 6 (six) hours as needed. What changed:  Another medication with the same name was added. Make sure you understand how and when  to take each.   oxyCODONE-acetaminophen 5-325 MG tablet Commonly known as:  PERCOCET/ROXICET Take 1 tablet by mouth every 4 (four) hours as needed for severe pain. What changed:  You were already taking a medication with the same name, and this prescription was added. Make sure you understand how and when to take each.   oxymetazoline 0.05 % nasal spray Commonly known as:  AFRIN Place 1 spray into both nostrils at bedtime as needed for congestion.   sildenafil 100 MG tablet Commonly known as:  VIAGRA Take 100 mg by mouth as needed for erectile dysfunction.            Durable Medical Equipment  (From admission, onward)        Start     Ordered   05/02/17 1451  DME Walker rolling  Once    Question:  Patient needs a walker to treat with the following condition  Answer:  S/P lumbar fusion   05/02/17 1450   05/02/17 1451  DME 3 n 1  Once     05/02/17 1450      Disposition: home   Final Dx: same as admitting  Discharge Instructions     Remove dressing in 72 hours   Complete by:  As directed    Call MD for:  difficulty breathing, headache or visual disturbances   Complete by:  As directed    Call MD for:  extreme fatigue   Complete by:  As directed    Call MD for:  hives   Complete by:  As directed    Call MD for:  persistant dizziness or  light-headedness   Complete by:  As directed    Call MD for:  persistant nausea and vomiting   Complete by:  As directed    Call MD for:  redness, tenderness, or signs of infection (pain, swelling, redness, odor or green/yellow discharge around incision site)   Complete by:  As directed    Call MD for:  severe uncontrolled pain   Complete by:  As directed    Call MD for:  temperature >100.4   Complete by:  As directed    Diet - low sodium heart healthy   Complete by:  As directed    Driving Restrictions   Complete by:  As directed    No driving 2 weeks   Increase activity slowly   Complete by:  As directed    Lifting restrictions   Complete by:  As directed    No lifting more than 8 lbs         Signed: Tiana LoftKimberly Hannah Ashante Yellin 05/03/2017, 7:17 AM

## 2017-05-03 NOTE — Progress Notes (Signed)
Patient is discharged from room 3C03 at this time. Alert and in stable condition. IV site d/c'[d and instructions read to patient and spouse with understanding verbalized. Left unit via wheelchair with all belongings at side. 

## 2017-05-03 NOTE — Evaluation (Signed)
Occupational Therapy Evaluation and Discharge Patient Details Name: Steven GrippeDavid L Easterday MRN: [id number] DOB: 01/13/1956 Today's Date: 05/03/2017    History of Present Illness Patient is a 62 yo male s/p CL/ PCF C3-6. Left Carpal Tunnel Release   Clinical Impression   This 62 yo male admitted and underwent above presents to acute OT with all education completed, we will D/C from acute OT.    Follow Up Recommendations  No OT follow up;Supervision/Assistance - 24 hour    Equipment Recommendations  None recommended by OT       Precautions / Restrictions Precautions Precautions: Cervical Precaution Booklet Issued: Yes (comment) Precaution Comments: Reviewed precautions during functional mobility Restrictions Weight Bearing Restrictions: Yes LUE Weight Bearing: Weight bear through elbow only Other Position/Activity Restrictions: No pushing, pulling, lifting with LUE      Mobility Bed Mobility Overal bed mobility: Modified Independent Bed Mobility: Supine to Sit;Sit to Supine     Supine to sit: HOB elevated Sit to supine: HOB elevated       Balance Overall balance assessment: Needs assistance Sitting-balance support: Feet supported Sitting balance-Leahy Scale: Good                             ADL either performed or assessed with clinical judgement   ADL                                         General ADL Comments: Pt will have wife A with LB ADLs due to he cannot do them without bending forward and this puts a strain/pressure on his neck. I educated pt on use of 2 cups for brushing teeth (one to spit and one rinse with straw) to avoid bending over sink. Also educated on sequence of dressing to make it more efficient as well as for pull over shirts to put head in first then arms. Pt is aware to keep left hand dry for bathing (recommended him to bag, rubber band but not to cut circulation off, bag again and tape--as well as when in shower to keep  arm upright instead of hanging down). Educated him on considering a hand held shower to make showering easier with the use of only one hand at present.,     Vision Patient Visual Report: No change from baseline              Pertinent Vitals/Pain Pain Assessment: 0-10 Pain Score: 2  Faces Pain Scale: Hurts a little bit Pain Location: posterior neck pain at surgical site Pain Descriptors / Indicators: Sore;Operative site guarding Pain Intervention(s): Limited activity within patient's tolerance;Monitored during session;Repositioned     Hand Dominance Right   Extremity/Trunk Assessment Upper Extremity Assessment Upper Extremity Assessment: LUE deficits/detail LUE Deficits / Details: left carpal tunnel release--moving fingers well within contraints of bandages; wrist blocked by bandages; rest of arm WFL           Communication Communication Communication: No difficulties   Cognition Arousal/Alertness: Awake/alert Behavior During Therapy: WFL for tasks assessed/performed Overall Cognitive Status: Within Functional Limits for tasks assessed                                        Exercises Other Exercises Other Exercises: Made him aware he can use  his left hand for cell phone use and holding a toothbrush while putting toothpaste on it, etc that does not put a strain on hand/wrist.        Home Living Family/patient expects to be discharged to:: Private residence Living Arrangements: Spouse/significant other Available Help at Discharge: Family;Available 24 hours/day Type of Home: House Home Access: Stairs to enter [employer] of Steps: 3 Entrance Stairs-Rails: Left Home Layout: One level     Bathroom Shower/Tub: Chief Strategy Officer: Handicapped height Bathroom Accessibility: Yes   Home Equipment: Environmental consultant - 2 wheels;Cane - single point;Crutches;Bedside commode;Cane - quad;Shower seat          Prior Functioning/Environment  Level of Independence: Independent                 OT Problem List: Decreased range of motion;Impaired UE functional use;Obesity         OT Goals(Current goals can be found in the care plan section) Acute Rehab OT Goals Patient Stated Goal: home today  OT Frequency:                AM-PAC PT "6 Clicks" Daily Activity     Outcome Measure Help from another person eating meals?: None Help from another person taking care of personal grooming?: A Little Help from another person toileting, which includes using toliet, bedpan, or urinal?: None Help from another person bathing (including washing, rinsing, drying)?: A Little Help from another person to put on and taking off regular upper body clothing?: A Little Help from another person to put on and taking off regular lower body clothing?: A Lot 6 Click Score: 19   End of Session Nurse Communication: (no further OT needs)  Activity Tolerance: Patient tolerated treatment well Patient left: in bed;with call bell/phone within reach  OT Visit Diagnosis: Pain Pain - part of body: (neck)                Time: 1610-9604 OT Time Calculation (min): 19 min Charges:  OT General Charges $OT Visit: 1 Visit OT Evaluation $OT Eval Moderate Complexity: [address] [place], [place] [phone] 05/03/2017

## 2017-05-03 NOTE — Progress Notes (Signed)
Physical Therapy Treatment Patient Details Name: [name] MRN: [id number] DOB: 09-20-1955 Today's Date: 05/03/2017    History of Present Illness Patient is a 62 yo male s/p CL/ PCF C3-6. Left Carpal Tunnel Release    PT Comments    Pt progressing towards physical therapy goals. Was able to perform transfers and ambulation with gross supervision for safety. Pt overall steady during gait training however noted occasional lateral drifting if pt was walking and talking at the same time. No assist required throughout session. Will continue to follow and progress as able per POC.    Follow Up Recommendations  No PT follow up;Supervision for mobility/OOB     Equipment Recommendations  None recommended by PT    Recommendations for Other Services       Precautions / Restrictions Precautions Precautions: Cervical Precaution Comments: Reviewed precautions during functional mobility    Mobility  Bed Mobility Overal bed mobility: Needs Assistance Bed Mobility: Sit to Supine;Supine to Sit     Supine to sit: Supervision Sit to supine: Supervision   General bed mobility comments: VC's for log roll technique. Pt typically in bed with HOB elevated due to OSA, and was able to transition to/from EOB easily. Verbally reinforced log roll technique.   Transfers Overall transfer level: Needs assistance Equipment used: None Transfers: Sit to/from Stand Sit to Stand: Supervision         General transfer comment: supervision for safety with power up to full standing  Ambulation/Gait Ambulation/Gait assistance: Supervision Ambulation Distance (Feet): 300 Feet Assistive device: None Gait Pattern/deviations: Step-through pattern;Decreased stride length;Drifts right/left Gait velocity: decreased Gait velocity interpretation: Below normal speed for age/gender General Gait Details: Mild instability noted however no assist required throughout gait training   Stairs Stairs: Yes    Stair Management: One rail Right;Step to pattern;Forwards Number of Stairs: 5 General stair comments: VC's for sequencing and general safety.   Wheelchair Mobility    Modified Rankin (Stroke Patients Only)       Balance Overall balance assessment: Needs assistance Sitting-balance support: Feet supported Sitting balance-Leahy Scale: Good     Standing balance support: During functional activity Standing balance-Leahy Scale: Fair                              Cognition Arousal/Alertness: Awake/alert Behavior During Therapy: WFL for tasks assessed/performed Overall Cognitive Status: Within Functional Limits for tasks assessed                                        Exercises      General Comments        Pertinent Vitals/Pain Pain Assessment: Faces Faces Pain Scale: Hurts a little bit Pain Location: posterior neck pain at surgical site Pain Descriptors / Indicators: Sore;Operative site guarding Pain Intervention(s): Limited activity within patient's tolerance;Monitored during session;Repositioned    Home Living                      Prior Function            PT Goals (current goals can now be found in the care plan section) Acute Rehab PT Goals Patient Stated Goal: to go home PT Goal Formulation: With patient Time For Goal Achievement: 05/16/17 Potential to Achieve Goals: Good Progress towards PT goals: Progressing toward goals    Frequency  Min 5X/week      PT Plan Current plan remains appropriate    Co-evaluation              AM-PAC PT "6 Clicks" Daily Activity  Outcome Measure  Difficulty turning over in bed (including adjusting bedclothes, sheets and blankets)?: None Difficulty moving from lying on back to sitting on the side of the bed? : A Little Difficulty sitting down on and standing up from a chair with arms (e.g., wheelchair, bedside commode, etc,.)?: A Little Help needed moving to and from a  bed to chair (including a wheelchair)?: A Little Help needed walking in hospital room?: A Little Help needed climbing 3-5 steps with a railing? : A Little 6 Click Score: 19    End of Session Equipment Utilized During Treatment: Gait belt Activity Tolerance: Patient tolerated treatment well Patient left: in bed;with call bell/phone within reach Nurse Communication: Mobility status PT Visit Diagnosis: Pain Pain - part of body: (back and hand)     Time: [id number] PT Time Calculation (min) (ACUTE ONLY): 15 min  Charges:  $Gait Training: 8-22 mins                    G Codes:       Conni SlipperLaura Carmello Cabiness, PT, DPT Acute Rehabilitation Services Pager: [phone]    Marylynn PearsonLaura D Jerred Zaremba 05/03/2017, 9:08 AM

## 2017-05-04 ENCOUNTER — Encounter (HOSPITAL_COMMUNITY): Payer: Self-pay | Admitting: Neurological Surgery

## 2017-12-06 ENCOUNTER — Encounter: Payer: Self-pay | Admitting: *Deleted

## 2017-12-07 ENCOUNTER — Encounter: Payer: Self-pay | Admitting: Diagnostic Neuroimaging

## 2017-12-07 ENCOUNTER — Ambulatory Visit: Payer: BLUE CROSS/BLUE SHIELD | Admitting: Diagnostic Neuroimaging

## 2017-12-07 DIAGNOSIS — H532 Diplopia: Secondary | ICD-10-CM | POA: Diagnosis not present

## 2017-12-07 NOTE — Progress Notes (Signed)
[facility]  PATIENT: [name] DOB: 1955/12/06  REFERRING CLINICIAN: Dellia Nims HISTORY FROM: patient and chart review  REASON FOR VISIT: new consult    HISTORICAL  CHIEF COMPLAINT:  Chief Complaint  Patient presents with  . New Patient (Initial Visit)    Rm 6, Alone  . Dr. Emily Filbert    Left 6th nerve paresis (double vision) sudden onset one week ago.   Using patch as needed.  Is driving.     HISTORY OF PRESENT ILLNESS:   62 year old male with hypertension, hypercholesteremia, obstructive sleep apnea on CPAP, obesity, here for evaluation of left cranial nerve VI palsy.  1 week ago patient woke up with sudden double vision when looking to the left.  Patient went to eye doctor for evaluation.  He was diagnosed with left CN VI palsy.  Patient was referred here for further evaluation.  No prodromal headache, dizziness, fevers, traumas.  No problems with numbness, weakness, slurred speech.  Patient denies any eye pain.  Patient has been using eye patch while driving.  Symptoms are fairly stable.   REVIEW OF SYSTEMS: Full 14 system review of systems performed and negative with exception of: Blurred vision double vision.  ALLERGIES: No Known Allergies  HOME MEDICATIONS: Outpatient Medications Prior to Visit  Medication Sig Dispense Refill  . aspirin 325 MG EC tablet Take 325 mg by mouth daily.    Marland Kitchen atorvastatin (LIPITOR) 40 MG tablet Take 40 mg by mouth every morning.     . celecoxib (CELEBREX) 200 MG capsule Take 400 mg by mouth daily. Takes 2 tablets    . Cholecalciferol 2000 units CAPS Take 4,000 Units by mouth daily. Takes 2 tablets    . Coenzyme Q10 (COQ10) 100 MG CAPS Take 200 mg by mouth daily. Takes 2 tablets    . diphenhydramine-acetaminophen (TYLENOL PM) 25-500 MG TABS Take 2 tablets by mouth at bedtime.     . docusate sodium (COLACE) 100 MG capsule Take 300 mg by mouth daily. Takes 3 tablets    . losartan (COZAAR) 100 MG tablet Take 100 mg  by mouth daily.    . methocarbamol (ROBAXIN) 750 MG tablet Take 325 mg by mouth every 8 (eight) hours. Takes 0.5 tablet  0  . Multiple Vitamins-Minerals (MULTIVITAMINS THER. W/MINERALS) TABS Take 1 tablet by mouth daily.      Marland Kitchen oxyCODONE-acetaminophen (PERCOCET) 10-325 MG tablet Take 1 tablet by mouth every 6 (six) hours as needed for pain. 80 tablet 0  . oxymetazoline (AFRIN) 0.05 % nasal spray Place 1 spray into both nostrils at bedtime as needed for congestion.    . sildenafil (VIAGRA) 100 MG tablet Take 100 mg by mouth as needed for erectile dysfunction.    Marland Kitchen lubiprostone (AMITIZA) 8 MCG capsule Take 1 capsule (8 mcg total) by mouth 2 (two) times daily with a meal. 30 capsule 0  . methocarbamol (ROBAXIN) 500 MG tablet Take 1 tablet (500 mg total) by mouth every 6 (six) hours as needed for muscle spasms. 30 tablet 0  . oxyCODONE-acetaminophen (PERCOCET/ROXICET) 5-325 MG tablet Take 1 tablet by mouth every 4 (four) hours as needed for severe pain. 30 tablet 0  . oxyCODONE-acetaminophen (ROXICET) 5-325 MG tablet Take 1-2 tablets by mouth every 6 (six) hours as needed. (Patient not taking: Reported on 04/23/2017) 30 tablet 0   No facility-administered medications prior to visit.     PAST MEDICAL HISTORY: Past Medical History:  Diagnosis Date  . Arthritis    back  .  Chronic lower back pain   . Hyperlipemia   . Hypertension   . OSA on CPAP    settings 14  . Right hydrocele   . Wears contact lenses     PAST SURGICAL HISTORY: Past Surgical History:  Procedure Laterality Date  . ANTERIOR CERVICAL DECOMP/DISCECTOMY FUSION  1999  . CARDIOVASCULAR STRESS TEST  06-23-2010   normal lexiscan nuclear study/  normal LV function and wall motion, ef 60%  . CARPAL TUNNEL RELEASE Left 05/02/2017   Procedure: LEFT CARPAL TUNNEL RELEASE;  Surgeon: Tia AlertJones, Drexel S, MD;  [facility];  Service: Neurosurgery;  Laterality: Left;  . COLONOSCOPY    . HYDROCELE EXCISION Right 08/25/2015   Procedure: RIGHT  HYDROCELE REPAIR;  Surgeon: Barron Alvineavid Grapey, MD;  [facility] [place];  Service: Urology;  Laterality: Right;  . KNEE ARTHROSCOPY Bilateral   . KNEE ARTHROSCOPY W/ SYNOVECTOMY Left 01-29-2007  &  re-do 02-05-2007  . MAXIMUM ACCESS (MAS)POSTERIOR LUMBAR INTERBODY FUSION (PLIF) 2 LEVEL N/A 06/17/2015   Procedure: LUMBAR FOUR-LUMBAR FIVE MAXIMUM ACCESS (MAS) POSTERIOR LUMBAR INTERBODY FUSION (PLIF) WITH LUMBAR ONE-TWO, LUMBAR FOUR-FIVE LAMINECTOMY ;  Surgeon: Tia Alertavid S Jones, MD;  [facility];  Service: Neurosurgery;  Laterality: N/A;  . POSTERIOR CERVICAL FUSION/FORAMINOTOMY N/A 05/02/2017   Procedure: Cervical three-six Posterior cervical fusion with lateral mass fixation, Cervical three-four laminectomy;  Surgeon: Tia AlertJones, Jahrel S, MD;  [facility];  Service: Neurosurgery;  Laterality: N/A;  . SCROTAL EXPLORATION N/A 08/25/2015   Procedure: SCROTUM EXPLORATION;  Surgeon: Barron Alvineavid Grapey, MD;  [facility] [place];  Service: Urology;  Laterality: N/A;  . SPERMATOCELECTOMY N/A 08/25/2015   Procedure: SPERMATOCELE EXCISION;  Surgeon: Barron Alvineavid Grapey, MD;  [facility] [place];  Service: Urology;  Laterality: N/A;  . TONSILLECTOMY  as child  . TOTAL HIP ARTHROPLASTY  05/01/2011   Procedure: TOTAL HIP ARTHROPLASTY;  Surgeon: Nestor LewandowskyFrank J Rowan;  [facility];  Service: Orthopedics;  Laterality: Left;  DEPUY  . TOTAL HIP ARTHROPLASTY Right 1995  . TOTAL KNEE ARTHROPLASTY Bilateral right 04-13-2008/  left 03-18-2007  . TRANSTHORACIC ECHOCARDIOGRAM  11-20-2006   mild LVH, 55-60%/  mild AV sclerosis without stenosis/  trace MR    FAMILY HISTORY: Family History  Problem Relation Age of Onset  . Heart failure Mother   . Heart disease Father   . Diabetes Father     SOCIAL HISTORY: Social History   Socioeconomic History  . Marital status: Married    Spouse name: Eunice BlaseDebbie  . Number of children: Not on file  . Years of education: Not on file  . Highest  education level: Not on file  Occupational History    Comment: retired  Engineer, productionocial Needs  . Financial resource strain: Not on file  . Food insecurity:    Worry: Not on file    Inability: Not on file  . Transportation needs:    Medical: Not on file    Non-medical: Not on file  Tobacco Use  . Smoking status: Never Smoker  . Smokeless tobacco: Never Used  Substance and Sexual Activity  . Alcohol use: No  . Drug use: No  . Sexual activity: Not on file  Lifestyle  . Physical activity:    Days per week: Not on file    Minutes per session: Not on file  . Stress: Not on file  Relationships  . Social connections:    Talks on phone: Not on file    Gets together: Not on  file    Attends religious service: Not on file    Active member of club or organization: Not on file    Attends meetings of clubs or organizations: Not on file    Relationship status: Not on file  . Intimate partner violence:    Fear of current or ex partner: Not on file    Emotionally abused: Not on file    Physically abused: Not on file    Forced sexual activity: Not on file  Other Topics Concern  . Not on file  Social History Narrative   Lives home with wife.  Retired.  Education 12th grade.  Children 2 boys.  Caffeine 2 cups am.     PHYSICAL EXAM  GENERAL EXAM/CONSTITUTIONAL: Vitals:  Vitals:   12/07/17 1049  BP: (!) 152/88  Pulse: 81  Weight: (!) 309 lb (140.2 kg)  Height: 5' 11.5" (1.816 m)     Body mass index is 42.5 kg/m. Wt Readings from Last 3 Encounters:  12/07/17 (!) 309 lb (140.2 kg)  05/02/17 (!) 310 lb (140.6 kg)  04/27/17 (!) 310 lb 14.4 oz (141 kg)     Patient is in no distress; well developed, nourished and groomed; neck is supple  CARDIOVASCULAR:  Examination of carotid arteries is normal; no carotid bruits  Regular rate and rhythm, no murmurs  Examination of peripheral vascular system by observation and palpation is normal  EYES:  Ophthalmoscopic exam of optic discs and  posterior segments is normal; no papilledema or hemorrhages  Visual Acuity Screening   Right eye Left eye Both eyes  Without correction:     With correction: 20/30 20/50   Comments: States were contacts L distance, and R near. 12/07/17 sy    MUSCULOSKELETAL:  Gait, strength, tone, movements noted in Neurologic exam below  NEUROLOGIC: MENTAL STATUS:  No flowsheet data found.  awake, alert, oriented to person, place and time  recent and remote memory intact  normal attention and concentration  language fluent, comprehension intact, naming intact  fund of knowledge appropriate  CRANIAL NERVE:   2nd - no papilledema on fundoscopic exam  2nd, 3rd, 4th, 6th - pupils equal and reactive to light, visual fields full to confrontation, extraocular muscles --> LEFT EYE ABDUCTION  LIMITED AND TRIGGERS DOUBLE VISION; no nystagmus;   5th - facial sensation symmetric  7th - facial strength symmetric  8th - hearing intact  9th - palate elevates symmetrically, uvula midline  11th - shoulder shrug symmetric  12th - tongue protrusion midline  MOTOR:   normal bulk and tone, full strength in the BUE, BLE  SENSORY:   normal and symmetric to light touch, temperature, vibration  COORDINATION:   finger-nose-finger, fine finger movements normal  REFLEXES:   deep tendon reflexes TRACE and symmetric  GAIT/STATION:   narrow based gait     DIAGNOSTIC DATA (LABS, IMAGING, TESTING) - I reviewed patient records, labs, notes, testing and imaging myself where available.  Lab Results  Component Value Date   WBC 10.9 (H) 04/27/2017   HGB 14.2 04/27/2017   HCT 42.5 04/27/2017   MCV 86.2 04/27/2017   PLT 229 04/27/2017      Component Value Date/Time   NA 138 04/27/2017 1519   K 4.2 04/27/2017 1519   CL 104 04/27/2017 1519   CO2 27 04/27/2017 1519   GLUCOSE 123 (H) 04/27/2017 1519   BUN 16 04/27/2017 1519   CREATININE 0.82 04/27/2017 1519   CALCIUM 9.2 04/27/2017 1519    PROT 7.2  06/10/2011 1330   ALBUMIN 3.6 06/10/2011 1330   AST 14 06/10/2011 1330   ALT 13 06/10/2011 1330   ALKPHOS 74 06/10/2011 1330   BILITOT 0.4 06/10/2011 1330   GFRNONAA >60 04/27/2017 1519   GFRAA >60 04/27/2017 1519   No results found for: CHOL, HDL, LDLCALC, LDLDIRECT, TRIG, CHOLHDL No results found for: ONGE9B No results found for: VITAMINB12 No results found for: TSH  02/09/17 MRI cervical spine [I reviewed images myself and agree with interpretation. -VRP]  - Distant ACDF at C5-6 appears solid with wide patency of the canal  and foramina. Cord atrophy and gliosis at the C6-7 level presumably  secondary to old compression.  - C4-5 retrolisthesis of 3 mm and spondylosis. Spinal stenosis with AP  diameter of canal approximately 6.5 mm. Some cord deformity.  Bilateral neural foraminal stenosis. Findings at this level likely  symptomatic.  - C3-4 spondylosis with canal measuring 7-8 mm. No cord deformity.  Bilateral foraminal narrowing that could affect either or both C4  nerves.  - Some left foraminal narrowing at C7-T1 and T1-2.    ASSESSMENT AND PLAN  62 y.o. year old male here with new onset, sudden double vision, with signs and symptoms consistent with left cranial nerve VI palsy.  Most likely represents microvascular infarction to the nerve.  We will proceed with further work-up and continue medical risk factor management.   Dx: LEFT CN6 PALSY (? Microvascular infarct to nerve; mass or inflamm less likely)  1. Diplopia      PLAN:  DOUBLE VISION (left CN6 palsy) - check MRI brain and orbits - continue aspirin, statin, CPAP for OSA - improve BP control (with PCP) - optimize nutrition and exercise  Orders Placed This Encounter  Procedures  . MR BRAIN W WO CONTRAST  . MR ORBITS W WO CONTRAST   Return pending test results.  I reviewed images, labs, notes, records myself. I summarized findings and reviewed with patient, for this high risk condition  (cranial nerve palsy; suspected microvascular infarction) requiring high complexity decision making.     Suanne Marker, MD 12/07/2017, 11:12 AM Certified in Neurology, Neurophysiology and Neuroimaging  [facility] [address], [address] [place], [place] [address] [phone]

## 2017-12-07 NOTE — Patient Instructions (Signed)
DOUBLE VISION (left CN6 palsy)  - check MRI brain and orbits  - continue aspirin, statin, CPAP for OSA  - improve BP control (with PCP)  - optimize nutrition and exercise

## 2017-12-10 ENCOUNTER — Telehealth: Payer: Self-pay | Admitting: Diagnostic Neuroimaging

## 2017-12-10 NOTE — Telephone Encounter (Signed)
[employer] Auth: [id number] (exp. 12/10/17 to 01/08/18) order sent to [facility]. They will reach out to the pt to schedule.

## 2017-12-20 ENCOUNTER — Telehealth: Payer: Self-pay | Admitting: Diagnostic Neuroimaging

## 2017-12-20 MED ORDER — ALPRAZOLAM 0.5 MG PO TABS: 0.5000 mg | ORAL_TABLET | ORAL | 0 refills | Status: DC | PRN

## 2017-12-20 NOTE — Telephone Encounter (Signed)
Spoke to pt and he is aware of prescription faxed to pharmacy [phone] fax and will need driver.

## 2017-12-20 NOTE — Telephone Encounter (Signed)
Pt requesting a call, stating he has an MRI this upcoming Saturday. Would like a medication called in to [facility] to help calm his nerves. Please advise

## 2017-12-20 NOTE — Telephone Encounter (Signed)
Meds ordered this encounter  Medications  . ALPRAZolam (XANAX) 0.5 MG tablet    Sig: Take 1 tablet (0.5 mg total) by mouth as needed for anxiety (for sedation before MRI scan; take 1 hour before scan; may repeat 15 min before scan).    Dispense:  3 tablet    Refill:  0    Suanne Marker, MD 12/20/2017, 3:01 PM Certified in Neurology, Neurophysiology and Neuroimaging  [facility] [address] [place], [place] [address] [phone]

## 2017-12-22 ENCOUNTER — Ambulatory Visit
Admission: RE | Admit: 2017-12-22 | Discharge: 2017-12-22 | Disposition: A | Discharge status: Home / Self Care | Payer: BLUE CROSS/BLUE SHIELD | Source: Ambulatory Visit | Attending: Diagnostic Neuroimaging | Admitting: Diagnostic Neuroimaging

## 2017-12-22 DIAGNOSIS — H532 Diplopia: Secondary | ICD-10-CM

## 2018-03-20 DIAGNOSIS — G4733 Obstructive sleep apnea (adult) (pediatric): Secondary | ICD-10-CM | POA: Diagnosis not present

## 2018-04-25 DIAGNOSIS — M2012 Hallux valgus (acquired), left foot: Secondary | ICD-10-CM | POA: Diagnosis not present

## 2018-04-25 DIAGNOSIS — M79672 Pain in left foot: Secondary | ICD-10-CM | POA: Diagnosis not present

## 2018-05-09 DIAGNOSIS — M5416 Radiculopathy, lumbar region: Secondary | ICD-10-CM | POA: Diagnosis not present

## 2018-06-10 DIAGNOSIS — I10 Essential (primary) hypertension: Secondary | ICD-10-CM | POA: Diagnosis not present

## 2018-06-10 DIAGNOSIS — M545 Low back pain: Secondary | ICD-10-CM | POA: Diagnosis not present

## 2018-06-10 DIAGNOSIS — M5416 Radiculopathy, lumbar region: Secondary | ICD-10-CM | POA: Diagnosis not present

## 2018-08-08 DIAGNOSIS — M5416 Radiculopathy, lumbar region: Secondary | ICD-10-CM | POA: Diagnosis not present

## 2018-08-28 DIAGNOSIS — H2513 Age-related nuclear cataract, bilateral: Secondary | ICD-10-CM | POA: Diagnosis not present

## 2018-09-02 DIAGNOSIS — G4733 Obstructive sleep apnea (adult) (pediatric): Secondary | ICD-10-CM | POA: Diagnosis not present

## 2018-09-10 DIAGNOSIS — I10 Essential (primary) hypertension: Secondary | ICD-10-CM | POA: Diagnosis not present

## 2018-09-10 DIAGNOSIS — Z6841 Body Mass Index (BMI) 40.0 and over, adult: Secondary | ICD-10-CM | POA: Diagnosis not present

## 2018-09-10 DIAGNOSIS — Z981 Arthrodesis status: Secondary | ICD-10-CM | POA: Diagnosis not present

## 2020-04-19 ENCOUNTER — Encounter: Payer: BLUE CROSS/BLUE SHIELD | Admitting: Family Medicine

## 2020-05-17 ENCOUNTER — Encounter: Payer: Self-pay | Admitting: Family Medicine

## 2020-09-29 ENCOUNTER — Other Ambulatory Visit: Payer: Self-pay

## 2020-09-29 ENCOUNTER — Encounter: Payer: Self-pay | Admitting: Family Medicine

## 2020-09-29 ENCOUNTER — Ambulatory Visit (INDEPENDENT_AMBULATORY_CARE_PROVIDER_SITE_OTHER): Payer: Medicare Other | Admitting: Family Medicine

## 2020-09-29 VITALS — BP 162/82 | Ht 71.0 in | Wt 288.0 lb

## 2020-09-29 DIAGNOSIS — M2142 Flat foot [pes planus] (acquired), left foot: Secondary | ICD-10-CM

## 2020-09-29 DIAGNOSIS — M25572 Pain in left ankle and joints of left foot: Secondary | ICD-10-CM | POA: Diagnosis present

## 2020-09-29 DIAGNOSIS — M2141 Flat foot [pes planus] (acquired), right foot: Secondary | ICD-10-CM

## 2020-09-29 DIAGNOSIS — G8929 Other chronic pain: Secondary | ICD-10-CM

## 2020-09-29 DIAGNOSIS — R269 Unspecified abnormalities of gait and mobility: Secondary | ICD-10-CM

## 2020-09-29 NOTE — Progress Notes (Signed)
PCP: [name], MD  Subjective:   HPI: Patient is a 65 y.o. male here for custom orthotics.  Patient comes in today as a referral from Dr. Lyn Hollingshead. He has fairly severe left pes planus with medial ankle subluxation. Pain primarily felt laterally distal to lateral malleolus with ambulation. Feels a pinching in this area and irritated by the bike. Benefit noted with sports insoles with scaphoid pads.  Past Medical History:  Diagnosis Date   Arthritis    back   Chronic lower back pain    Hyperlipemia    Hypertension    OSA on CPAP    settings 14   Right hydrocele    Wears contact lenses     Current Outpatient Medications on File Prior to Visit  Medication Sig Dispense Refill   ALPRAZolam (XANAX) 0.5 MG tablet Take 1 tablet (0.5 mg total) by mouth as needed for anxiety (for sedation before MRI scan; take 1 hour before scan; may repeat 15 min before scan). 3 tablet 0   aspirin 325 MG EC tablet Take 325 mg by mouth daily.     atorvastatin (LIPITOR) 40 MG tablet Take 40 mg by mouth every morning.      celecoxib (CELEBREX) 200 MG capsule Take 400 mg by mouth daily. Takes 2 tablets     Cholecalciferol 2000 units CAPS Take 4,000 Units by mouth daily. Takes 2 tablets     Coenzyme Q10 (COQ10) 100 MG CAPS Take 200 mg by mouth daily. Takes 2 tablets     diphenhydramine-acetaminophen (TYLENOL PM) 25-500 MG TABS Take 2 tablets by mouth at bedtime.      docusate sodium (COLACE) 100 MG capsule Take 300 mg by mouth daily. Takes 3 tablets     losartan (COZAAR) 100 MG tablet Take 100 mg by mouth daily.     methocarbamol (ROBAXIN) 750 MG tablet Take 325 mg by mouth every 8 (eight) hours. Takes 0.5 tablet  0   Multiple Vitamins-Minerals (MULTIVITAMINS THER. W/MINERALS) TABS Take 1 tablet by mouth daily.       oxymetazoline (AFRIN) 0.05 % nasal spray Place 1 spray into both nostrils at bedtime as needed for congestion.     sildenafil (VIAGRA) 100 MG tablet Take 100 mg by mouth as needed for  erectile dysfunction.     No current facility-administered medications on file prior to visit.    Past Surgical History:  Procedure Laterality Date   ANTERIOR CERVICAL DECOMP/DISCECTOMY FUSION  1999   CARDIOVASCULAR STRESS TEST  06-23-2010   normal lexiscan nuclear study/  normal LV function and wall motion, ef 60%   CARPAL TUNNEL RELEASE Left 05/02/2017   Procedure: LEFT CARPAL TUNNEL RELEASE;  Surgeon: Tia Alert, MD;  Location: [facility];  Service: Neurosurgery;  Laterality: Left;   COLONOSCOPY     HYDROCELE EXCISION Right 08/25/2015   Procedure: RIGHT HYDROCELE REPAIR;  Surgeon: Barron Alvine, MD;  [facility];  Service: Urology;  Laterality: Right;   KNEE ARTHROSCOPY Bilateral    KNEE ARTHROSCOPY W/ SYNOVECTOMY Left 01-29-2007  &  re-do 02-05-2007   MAXIMUM ACCESS (MAS)POSTERIOR LUMBAR INTERBODY FUSION (PLIF) 2 LEVEL N/A 06/17/2015   Procedure: LUMBAR FOUR-LUMBAR FIVE MAXIMUM ACCESS (MAS) POSTERIOR LUMBAR INTERBODY FUSION (PLIF) WITH LUMBAR ONE-TWO, LUMBAR FOUR-FIVE LAMINECTOMY ;  Surgeon: Tia Alert, MD;  Location: [facility];  Service: Neurosurgery;  Laterality: N/A;   POSTERIOR CERVICAL FUSION/FORAMINOTOMY N/A 05/02/2017   Procedure: Cervical three-six Posterior cervical fusion with lateral mass fixation, Cervical three-four laminectomy;  Surgeon: Tia Alert, MD;  Location: [facility];  Service: Neurosurgery;  Laterality: N/A;   SCROTAL EXPLORATION N/A 08/25/2015   Procedure: SCROTUM EXPLORATION;  Surgeon: Barron Alvine, MD;  Location: [facility];  Service: Urology;  Laterality: N/A;   SPERMATOCELECTOMY N/A 08/25/2015   Procedure: SPERMATOCELE EXCISION;  Surgeon: Barron Alvine, MD;  Location: [facility];  Service: Urology;  Laterality: N/A;   TONSILLECTOMY  as child   TOTAL HIP ARTHROPLASTY  05/01/2011   Procedure: TOTAL HIP ARTHROPLASTY;  Surgeon: Nestor Lewandowsky;  [facility];  Service: Orthopedics;  Laterality: Left;  DEPUY    TOTAL HIP ARTHROPLASTY Right 1995   TOTAL KNEE ARTHROPLASTY Bilateral right 04-13-2008/  left 03-18-2007   TRANSTHORACIC ECHOCARDIOGRAM  11-20-2006   mild LVH, 55-60%/  mild AV sclerosis without stenosis/  trace MR    No Known Allergies  Social History   Socioeconomic History   Marital status: Married    Spouse name: Debbie   Number of children: Not on file   Years of education: Not on file   Highest education level: Not on file  Occupational History    Comment: retired  Tobacco Use   Smoking status: Never   Smokeless tobacco: Never  Vaping Use   Vaping Use: Never used  Substance and Sexual Activity   Alcohol use: No   Drug use: No   Sexual activity: Not on file  Other Topics Concern   Not on file  Social History Narrative   Lives home with wife.  Retired.  Education 12th grade.  Children 2 boys.  Caffeine 2 cups am.   Social Determinants of Health   Financial Resource Strain: Not on file  Food Insecurity: Not on file  Transportation Needs: Not on file  Physical Activity: Not on file  Stress: Not on file  Social Connections: Not on file  Intimate Partner Violence: Not on file    Family History  Problem Relation Age of Onset   Heart failure Mother    Heart disease Father    Diabetes Father     BP (!) 162/82   Ht 5\' 11"  (1.803 m)   Wt 288 lb (130.6 kg)   BMI 40.17 kg/m   No flowsheet data found.  No flowsheet data found.  Review of Systems: See HPI above.     Objective:  Physical Exam:  Gen: NAD, comfortable in exam room  Right foot/ankle: Splaying all digits, transverse arch collapse, pes planus.  No other gross deformity, swelling, ecchymoses FROM digits.  No hallux rigidus or valgus. No TTP. NV intact distally.  Left foot/ankle: Minimal transverse arch collapse.  Notable pes planus with medial ankle subluxation.  No other gross deformity, swelling, ecchymoses Hallux rigidus. No TTP. NV intact distally.  Left leg slightly longer than  right.   Assessment & Plan:  1. Left ankle pain - due to compression distal fibula on calcaneus with significant pes planus.  Improvement with sports insoles - custom orthotics made today and felt comfortable.  Discussed how to break these in, what to call for if adjustment needed.  Patient was fitted for a : standard, cushioned, semi-rigid orthotic. The orthotic was heated and afterward the patient stood on the orthotic blank positioned on the orthotic stand. The patient was positioned in subtalar neutral position and 10 degrees of ankle dorsiflexion in a weight bearing stance. After completion of molding, a stable base was applied to the orthotic blank. The blank was ground to a  stable position for weight bearing. Size: 13 Base: blue med density eva Posting: none Additional orthotic padding: medial heel wedge on right

## 2020-10-12 ENCOUNTER — Ambulatory Visit (INDEPENDENT_AMBULATORY_CARE_PROVIDER_SITE_OTHER): Payer: Medicare Other | Admitting: Family Medicine

## 2020-10-12 ENCOUNTER — Other Ambulatory Visit: Payer: Self-pay

## 2020-10-12 VITALS — BP 130/67 | Ht 71.0 in | Wt 288.0 lb

## 2020-10-12 DIAGNOSIS — M2142 Flat foot [pes planus] (acquired), left foot: Secondary | ICD-10-CM

## 2020-10-12 DIAGNOSIS — M2141 Flat foot [pes planus] (acquired), right foot: Secondary | ICD-10-CM

## 2020-10-12 DIAGNOSIS — R269 Unspecified abnormalities of gait and mobility: Secondary | ICD-10-CM

## 2020-10-12 NOTE — Progress Notes (Signed)
Patient seen in the office for orthotic adjustment.  Unfortunately memory plastic on left did not hold its shape after about a week.  Heated back up and molded by hand instead of having him stand on the blank.  He also tried medium scaphoid pad to hold this under the left orthotic but felt like it was too much - can consider small scaphoid if does not hold its shape this time.  On right side switched from medial heel wedge to 3/4 length medial wedge as he felt like right side was 'falling in a hole' and he does have a small leg length discrepancy noted on today's exam though compensated some by his significant pes planus on left.  Felt comfortable to him today - advised to let us know over next 1-2 weeks how he's doing.

## 2021-07-24 ENCOUNTER — Emergency Department (HOSPITAL_BASED_OUTPATIENT_CLINIC_OR_DEPARTMENT_OTHER)
Admission: EM | Admit: 2021-07-24 | Discharge: 2021-07-24 | Disposition: A | Discharge status: Home / Self Care | Payer: Medicare Other | Attending: Emergency Medicine | Admitting: Emergency Medicine

## 2021-07-24 ENCOUNTER — Encounter (HOSPITAL_BASED_OUTPATIENT_CLINIC_OR_DEPARTMENT_OTHER): Payer: Self-pay | Admitting: Emergency Medicine

## 2021-07-24 ENCOUNTER — Other Ambulatory Visit: Payer: Self-pay

## 2021-07-24 DIAGNOSIS — Y92000 Kitchen of unspecified non-institutional (private) residence as  the place of occurrence of the external cause: Secondary | ICD-10-CM | POA: Insufficient documentation

## 2021-07-24 DIAGNOSIS — W19XXXA Unspecified fall, initial encounter: Secondary | ICD-10-CM

## 2021-07-24 DIAGNOSIS — W01198A Fall on same level from slipping, tripping and stumbling with subsequent striking against other object, initial encounter: Secondary | ICD-10-CM | POA: Insufficient documentation

## 2021-07-24 DIAGNOSIS — Y9301 Activity, walking, marching and hiking: Secondary | ICD-10-CM | POA: Insufficient documentation

## 2021-07-24 DIAGNOSIS — S0181XA Laceration without foreign body of other part of head, initial encounter: Secondary | ICD-10-CM | POA: Diagnosis not present

## 2021-07-24 DIAGNOSIS — Z7982 Long term (current) use of aspirin: Secondary | ICD-10-CM | POA: Insufficient documentation

## 2021-07-24 DIAGNOSIS — S0081XA Abrasion of other part of head, initial encounter: Secondary | ICD-10-CM

## 2021-07-24 DIAGNOSIS — S0990XA Unspecified injury of head, initial encounter: Secondary | ICD-10-CM | POA: Diagnosis present

## 2021-07-24 NOTE — ED Provider Notes (Signed)
?[facility] EMERGENC[facility] DEPARTMENT ?Provider Note ? ? ?CSN: [id number] ?Arrival date & time: 07/24/21  1618 ? ?  ? ?History ?PMH: HLD, HTN, OSA ?Chief Complaint  ?Patient presents with  ? Fall  ? ? ?[name] is a 66 y.o. male. ?Presents after a mechanical fall where he tripped on his new carpet.  He fell over and scraped the right side of his forehead.  No loss of consciousness noted.  He is not on blood thinners.  Denies any dizziness, nausea, vomiting, visual changes, speech changes, numbness, weakness, or headaches.  He has had his tetanus shot in the last 5 years.  ? ? ?Fall ? ? ?  ? ?Home Medications ?Prior to Admission medications   ?Medication Sig Start Date End Date Taking? Authorizing Provider  ?ALPRAZolam (XANAX) 0.5 MG tablet Take 1 tablet (0.5 mg total) by mouth as needed for anxiety (for sedation before MRI scan; take 1 hour before scan; may repeat 15 min before scan). 12/20/17   Penumalli, Earlean Polka, MD  ?aspirin 325 MG EC tablet Take 325 mg by mouth daily.    [provider]  ?atorvastatin (LIPITOR) 40 MG tablet Take 40 mg by mouth every morning.     [provider]  ?celecoxib (CELEBREX) 200 MG capsule Take 400 mg by mouth daily. Takes 2 tablets    [provider]  ?Cholecalciferol 2000 units CAPS Take 4,000 Units by mouth daily. Takes 2 tablets    [provider]  ?Coenzyme Q10 (COQ10) 100 MG CAPS Take 200 mg by mouth daily. Takes 2 tablets    [provider]  ?diphenhydramine-acetaminophen (TYLENOL PM) 25-500 MG TABS Take 2 tablets by mouth at bedtime.     [provider]  ?docusate sodium (COLACE) 100 MG capsule Take 300 mg by mouth daily. Takes 3 tablets    [provider]  ?losartan (COZAAR) 100 MG tablet Take 100 mg by mouth daily.    [provider]  ?methocarbamol (ROBAXIN) 750 MG tablet Take 325 mg by mouth every 8 (eight) hours. Takes 0.5 tablet 05/15/15   [provider]  ?Multiple Vitamins-Minerals  (MULTIVITAMINS THER. W/MINERALS) TABS Take 1 tablet by mouth daily.      [provider]  ?oxymetazoline (AFRIN) 0.05 % nasal spray Place 1 spray into both nostrils at bedtime as needed for congestion.    [provider]  ?sildenafil (VIAGRA) 100 MG tablet Take 100 mg by mouth as needed for erectile dysfunction.    [provider]  ?   ? ?Allergies    ?Patient has no known allergies.   ? ?Review of Systems   ?Review of Systems  ?All other systems reviewed and are negative. ? ?Physical Exam ?Updated Vital Signs ?BP (!) 167/82 (BP Location: Left Arm)   Pulse 87   Temp 98.2 ?F (36.8 ?C)   Resp 18   Ht 5\' 11"  (1.803 m)   Wt 133.8 kg   SpO2 98%   BMI 41.14 kg/m?  ?Physical Exam ?Vitals and nursing note reviewed.  ?Constitutional:   ?   General: He is not in acute distress. ?   Appearance: Normal appearance. He is well-developed. He is not ill-appearing, toxic-appearing or diaphoretic.  ?HENT:  ?   Head: Normocephalic.  ?   Comments: Dermal abrasion to right forehead.  It is still oozing.  Small hematoma noted.   ?   Nose: No nasal deformity.  ?   Mouth/Throat:  ?   Lips: Pink. No  lesions.  ?Eyes:  ?   General: Gaze aligned appropriately. No scleral icterus.    ?   Right eye: No discharge.     ?   Left eye: No discharge.  ?   Extraocular Movements: Extraocular movements intact.  ?   Conjunctiva/sclera: Conjunctivae normal.  ?   Right eye: Right conjunctiva is not injected. No exudate or hemorrhage. ?   Left eye: Left conjunctiva is not injected. No exudate or hemorrhage. ?   Pupils: Pupils are equal, round, and reactive to light.  ?   Comments: No EOM entrapment noted.  Patient is intact.  No tenderness right over the superior orbital bone.  ?Pulmonary:  ?   Effort: Pulmonary effort is normal. No respiratory distress.  ?Musculoskeletal:  ?   Comments: NO c spine tenderness or stepoffs  ?Skin: ?   General: Skin is warm and dry.  ?   Findings: Abrasion present.  ? ?    ?Neurological:  ?    Mental Status: He is alert and oriented to person, place, and time.  ?   Comments: Alert and Oriented x 3 ?Speech clear with no aphasia ?Cranial Nerve testing ?- PERRLA. EOM intact. No Nystagmus ?- Facial Sensation grossly intact ?- No facial asymmetry ?- Uvula and Tongue Midline ?- Accessory Muscles intact ?Motor: ?- 5/5 motor strength in all four extremities.  ?Sensation: ?- Grossly intact in all four extremities.  ?Coordination:  ?- Gait without abnormality. ?  ?Psychiatric:     ?   Mood and Affect: Mood normal.     ?   Speech: Speech normal.     ?   Behavior: Behavior normal. Behavior is cooperative.  ? ? ?ED Results / Procedures / Treatments   ?Labs ?(all labs ordered are listed, but only abnormal results are displayed) ?Labs Reviewed - No data to display ? ?EKG ?None ? ?Radiology ?No results found. ? ?Procedures ?Marland Kitchen.Laceration Repair ? ?Date/Time: 07/24/2021 6:29 PM ?Performed by: Adolphus Birchwood, PA-C ?Authorized by: Adolphus Birchwood, PA-C  ? ?Consent:  ?  Consent obtained:  Verbal ?  Consent given by:  Patient ?  Risks, benefits, and alternatives were discussed: yes   ?  Risks discussed:  Infection, pain and poor cosmetic result ?  Alternatives discussed:  No treatment ?Universal protocol:  ?  Procedure explained and questions answered to patient or proxy's satisfaction: yes   ?  Patient identity confirmed:  Verbally with patient ?Anesthesia:  ?  Anesthesia method:  None ?Laceration details:  ?  Location:  Face ?  Face location:  Forehead ?Exploration:  ?  Hemostasis obtained with: dermabond. ?  Wound exploration: entire depth of wound visualized   ?  Wound extent: no areolar tissue violation noted, no fascia violation noted, no foreign bodies/material noted, no muscle damage noted, no nerve damage noted, no tendon damage noted, no underlying fracture noted and no vascular damage noted   ?Treatment:  ?  Area cleansed with:  Saline ?  Amount of cleaning:  Standard ?  Irrigation method:  Syringe ?Skin repair:   ?  Repair method:  Tissue adhesive ?Repair type:  ?  Repair type:  Simple ?Post-procedure details:  ?  Dressing:  Open (no dressing) ?  Procedure completion:  Tolerated  ? ? ?Medications Ordered in ED ?Medications - No data to display ? ?ED Course/ Medical Decision Making/ A&P ?  ?                        ?  Medical Decision Making ? ? ?MDM  ?This is a 66 y.o. male who presents to the ED with mechanical fall to head and abrasion ? ?My Impression, Plan, and ED Course: Patient well-appearing.  He had no neurological symptoms or loss of consciousness noted.  He is not on blood thinners.  Discussed the utility of getting a CT head this patient given his age being over 25.  Patient deferred this at this time and will return if he develops any further neurological symptoms. ?He does have an abrasion over his right eyebrow that is continuously oozing.  Did not get better after pressure.  I applied Dermabond and allowed to dry which seem to coagulate the wound. ?No other concerning findings on exam.  He has no other complaints at this time.  Supportive treatment at home and return precautions are provided. ? ? ?Charting Requirements ?Additional history is obtained from:  Independent historian ?External Records from outside source obtained and reviewed including: n/a ?Social Determinants of Health:  none ?Pertinant PMH that complicates patient's illness: n/a ? ?Patient Care ?Problems that were addressed during this visit: ?- Fall: Acute illness with complication ?- Forehead abrasion: Acute illness with complication ?Critical Care Interventions: stopped bleeding with dermabond ?Disposition: supportive tx, return precautions ? ?This is a supervised visit with my attending physician, Dr. Sherry Ruffing. We have discussed this patient and they have altered the plan as needed. ? ?Portions of this note were generated with Lobbyist. Dictation errors may occur despite best attempts at proofreading. ?  ? ?  ? ?Final Clinical  Impression(s) / ED Diagnoses ?Final diagnoses:  ?Fall, initial encounter  ?Abrasion of forehead, initial encounter  ? ? ?Rx / DC Orders ?ED Discharge Orders   ? ? None  ? ?  ? ? ?  ?Adolphus Birchwood, PA-C ?04/

## 2021-07-24 NOTE — ED Triage Notes (Signed)
Pt reports fell (tripped on new carpet walking from LR to kitchen) and hit head on kitchen floor; denies LOC ?

## 2021-07-24 NOTE — Discharge Instructions (Signed)
Please return to the emergency department if you develop worsening headaches, confusion, numbness, weakness, speech changes, visual changes, vomiting.  These may be symptoms that you have an underlying brain injury related to your fall.  You will need head imaging at this time if any of the symptoms occur. ? ? ?

## 2021-08-19 ENCOUNTER — Ambulatory Visit (INDEPENDENT_AMBULATORY_CARE_PROVIDER_SITE_OTHER): Payer: Medicare Other | Admitting: Podiatry

## 2021-08-19 ENCOUNTER — Ambulatory Visit (INDEPENDENT_AMBULATORY_CARE_PROVIDER_SITE_OTHER): Payer: Medicare Other

## 2021-08-19 DIAGNOSIS — M25572 Pain in left ankle and joints of left foot: Secondary | ICD-10-CM

## 2021-08-19 DIAGNOSIS — M19072 Primary osteoarthritis, left ankle and foot: Secondary | ICD-10-CM

## 2021-08-19 DIAGNOSIS — M2142 Flat foot [pes planus] (acquired), left foot: Secondary | ICD-10-CM | POA: Diagnosis not present

## 2021-08-19 DIAGNOSIS — M7752 Other enthesopathy of left foot: Secondary | ICD-10-CM

## 2021-08-19 DIAGNOSIS — M79672 Pain in left foot: Secondary | ICD-10-CM

## 2021-08-22 NOTE — Progress Notes (Signed)
Subjective:  ? ?Patient ID: [name], male   DOB: 66 y.o.   MRN: [id number]  ? ?HPI ?66 year old male presents the office today for evaluation of left foot deformity and pain to his left foot and ankle which has been getting worse for the last several years.  Most recently he states on Easter Sunday he did have a fall and has been having some increased pain in the lateral aspect of foot.  He has noticed over the years that his foot has been turning more.  He had a history of Legg-Calve-Perthes as a child.  He states is resulted in about 2 and swelling discrepancy.  Since he has had hip and knee replacement surgeries on the left 5/8 inch off.  He states that due to compensation the foot has been turning in.  He also history of back issues and has had fusions in his back. ? ? ?Review of Systems  ?All other systems reviewed and are negative. ? ?Past Medical History:  ?Diagnosis Date  ? Arthritis   ? back  ? Chronic lower back pain   ? Hyperlipemia   ? Hypertension   ? OSA on CPAP   ? settings 14  ? Right hydrocele   ? Wears contact lenses   ? ? ?Past Surgical History:  ?Procedure Laterality Date  ? ANTERIOR CERVICAL DECOMP/DISCECTOMY FUSION  1999  ? CARDIOVASCULAR STRESS TEST  06-23-2010  ? normal lexiscan nuclear study/  normal LV function and wall motion, ef 60%  ? CARPAL TUNNEL RELEASE Left 05/02/2017  ? Procedure: LEFT CARPAL TUNNEL RELEASE;  Surgeon: Tia Alert, MD;  Location: [facility] OR;  Service: Neurosurgery;  Laterality: Left;  ? COLONOSCOPY    ? HYDROCELE EXCISION Right 08/25/2015  ? Procedure: RIGHT HYDROCELE REPAIR;  Surgeon: Barron Alvine, MD;  Location: [facility];  Service: Urology;  Laterality: Right;  ? KNEE ARTHROSCOPY Bilateral   ? KNEE ARTHROSCOPY W/ SYNOVECTOMY Left 01-29-2007  &  re-do 02-05-2007  ? MAXIMUM ACCESS (MAS)POSTERIOR LUMBAR INTERBODY FUSION (PLIF) 2 LEVEL N/A 06/17/2015  ? Procedure: LUMBAR FOUR-LUMBAR FIVE MAXIMUM ACCESS (MAS) POSTERIOR LUMBAR INTERBODY FUSION (PLIF)  WITH LUMBAR ONE-TWO, LUMBAR FOUR-FIVE LAMINECTOMY ;  Surgeon: Tia Alert, MD;  Location: [facility];  Service: Neurosurgery;  Laterality: N/A;  ? POSTERIOR CERVICAL FUSION/FORAMINOTOMY N/A 05/02/2017  ? Procedure: Cervical three-six Posterior cervical fusion with lateral mass fixation, Cervical three-four laminectomy;  Surgeon: Tia Alert, MD;  Location: [facility] OR;  Service: Neurosurgery;  Laterality: N/A;  ? SCROTAL EXPLORATION N/A 08/25/2015  ? Procedure: SCROTUM EXPLORATION;  Surgeon: Barron Alvine, MD;  Location: [facility];  Service: Urology;  Laterality: N/A;  ? SPERMATOCELECTOMY N/A 08/25/2015  ? Procedure: SPERMATOCELE EXCISION;  Surgeon: Barron Alvine, MD;  Location: [facility];  Service: Urology;  Laterality: N/A;  ? TONSILLECTOMY  as child  ? TOTAL HIP ARTHROPLASTY  05/01/2011  ? Procedure: TOTAL HIP ARTHROPLASTY;  Surgeon: Nestor Lewandowsky;  Location: [facility] OR;  Service: Orthopedics;  Laterality: Left;  DEPUY  ? TOTAL HIP ARTHROPLASTY Right 1995  ? TOTAL KNEE ARTHROPLASTY Bilateral right 04-13-2008/  left 03-18-2007  ? TRANSTHORACIC ECHOCARDIOGRAM  11-20-2006  ? mild LVH, 55-60%/  mild AV sclerosis without stenosis/  trace MR  ? ? ? ?Current Outpatient Medications:  ?  ALPRAZolam (XANAX) 0.5 MG tablet, Take 1 tablet (0.5 mg total) by mouth as needed for anxiety (for sedation before MRI scan; take 1 hour before scan; may repeat 15  min before scan)., Disp: 3 tablet, Rfl: 0 ?  aspirin 325 MG EC tablet, Take 325 mg by mouth daily., Disp: , Rfl:  ?  atorvastatin (LIPITOR) 40 MG tablet, Take 40 mg by mouth every morning. , Disp: , Rfl:  ?  celecoxib (CELEBREX) 200 MG capsule, Take 400 mg by mouth daily. Takes 2 tablets, Disp: , Rfl:  ?  Cholecalciferol 2000 units CAPS, Take 4,000 Units by mouth daily. Takes 2 tablets, Disp: , Rfl:  ?  Coenzyme Q10 (COQ10) 100 MG CAPS, Take 200 mg by mouth daily. Takes 2 tablets, Disp: , Rfl:  ?  diphenhydramine-acetaminophen (TYLENOL PM) 25-500 MG  TABS, Take 2 tablets by mouth at bedtime. , Disp: , Rfl:  ?  docusate sodium (COLACE) 100 MG capsule, Take 300 mg by mouth daily. Takes 3 tablets, Disp: , Rfl:  ?  losartan (COZAAR) 100 MG tablet, Take 100 mg by mouth daily., Disp: , Rfl:  ?  methocarbamol (ROBAXIN) 750 MG tablet, Take 325 mg by mouth every 8 (eight) hours. Takes 0.5 tablet, Disp: , Rfl: 0 ?  Multiple Vitamins-Minerals (MULTIVITAMINS THER. W/MINERALS) TABS, Take 1 tablet by mouth daily.  , Disp: , Rfl:  ?  oxymetazoline (AFRIN) 0.05 % nasal spray, Place 1 spray into both nostrils at bedtime as needed for congestion., Disp: , Rfl:  ?  sildenafil (VIAGRA) 100 MG tablet, Take 100 mg by mouth as needed for erectile dysfunction., Disp: , Rfl:  ? ?No Known Allergies ? ? ? ?   ?Objective:  ?Physical Exam  ?General: AAO x3, NAD ? ?Dermatological: Skin is warm, dry and supple bilateral. There are no open sores, no preulcerative lesions, no rash or signs of infection present. ? ?Vascular: Dorsalis Pedis artery and Posterior Tibial artery pedal pulses are 2/4 bilateral with immedate capillary fill time. There is no pain with calf compression, swelling, warmth, erythema.  ? ?Neruologic: Grossly intact via light touch bilateral.  ? ?Musculoskeletal: Significant flatfoot is present on the left foot.  There is no significant pain with with ankle joint range of motion.  There is discomfort and decreased range of motion of the subtalar joint.  There is localized edema present lateral aspect of the foot along the sinus tarsi.  He does get some tenderness to the lateral aspect of the foot more distally but there is no specific area of pinpoint tenderness.  Flexor, extensor tendons clinically appear to be intact.  Muscular strength 5/5 in all groups tested bilateral. ? ?Gait: Unassisted, Nonantalgic.  ? ? ?   ?Assessment:  ? ?Severe flatfoot, arthritis left foot ? ?   ?Plan:  ?-Treatment options discussed including all alternatives, risks, and complications ?-Etiology  of symptoms were discussed ?-X-rays were obtained and reviewed with the patient.  Significant uncovering of the talonavicular joint.  Arthritic changes present the subtalar joint.  Decreased calcaneal inclination angle. ?-We discussed both conservative as well as surgical treatment options.  He is interested in surgical intervention.  Discussed and likely triple arthrodesis however I am going to order an MRI prior to this to further evaluate for surgical planning. ?-Steroid injection was performed today.  Skin was cleaned with Betadine, alcohol and mixture of 1 cc Kenalog 10, 0.5 cc of Marcaine plain, 0.5 cc of lidocaine plain was infiltrated into the sinus tarsi without complications.  Postinjection care was discussed. ? ?Vivi Barrack DPM ? ?   ? ?

## 2021-09-05 ENCOUNTER — Telehealth: Payer: Self-pay | Admitting: Podiatry

## 2021-09-05 ENCOUNTER — Other Ambulatory Visit: Payer: Self-pay | Admitting: Podiatry

## 2021-09-05 DIAGNOSIS — M19072 Primary osteoarthritis, left ankle and foot: Secondary | ICD-10-CM

## 2021-09-05 NOTE — Telephone Encounter (Signed)
Pt called and said the referral didn't have the open top unit. And would need to go to some one else for a open MRI machine.

## 2021-09-06 NOTE — Telephone Encounter (Signed)
They have an open MRI at another one of their facilities in [place]([phone]

## 2021-09-07 ENCOUNTER — Telehealth: Payer: Self-pay | Admitting: *Deleted

## 2021-09-07 NOTE — Telephone Encounter (Signed)
Faxed order/ demographics to [facility] ,confirmation received 09/07/21,patient notified.

## 2021-09-13 ENCOUNTER — Other Ambulatory Visit: Payer: Self-pay | Admitting: Podiatry

## 2021-09-13 DIAGNOSIS — M19072 Primary osteoarthritis, left ankle and foot: Secondary | ICD-10-CM

## 2021-09-27 ENCOUNTER — Ambulatory Visit (INDEPENDENT_AMBULATORY_CARE_PROVIDER_SITE_OTHER): Payer: Medicare Other | Admitting: Podiatry

## 2021-09-27 DIAGNOSIS — M7752 Other enthesopathy of left foot: Secondary | ICD-10-CM

## 2021-09-27 DIAGNOSIS — M2142 Flat foot [pes planus] (acquired), left foot: Secondary | ICD-10-CM | POA: Diagnosis not present

## 2021-09-27 DIAGNOSIS — M1000 Idiopathic gout, unspecified site: Secondary | ICD-10-CM | POA: Diagnosis not present

## 2021-09-27 DIAGNOSIS — M19072 Primary osteoarthritis, left ankle and foot: Secondary | ICD-10-CM | POA: Diagnosis not present

## 2021-09-27 NOTE — Patient Instructions (Signed)

## 2021-09-28 ENCOUNTER — Other Ambulatory Visit: Payer: Self-pay | Admitting: Podiatry

## 2021-09-29 LAB — URIC ACID: Uric Acid, Serum: 6.2 mg/dL (ref 4.0–8.0)

## 2021-10-04 ENCOUNTER — Telehealth: Payer: Self-pay | Admitting: *Deleted

## 2021-10-04 NOTE — Telephone Encounter (Signed)
-----   Message from Vivi Barrack, DPM sent at 10/04/2021  7:16 AM EDT ----- I saw this patient last week and recommended referral to Dr. Lucita Ferrara at [facility] orthopedics.  Could you please fax the referral.  This order was placed follow-up in the chart it was not there so I replaced the order.  Can you please fax the referral, clinic notes, MRI which I have a copy of.

## 2021-10-04 NOTE — Telephone Encounter (Signed)
Faxed referral / notes /MRI results to Dr Clifton Custard Scott's office, received confirmation 10/04/21.

## 2021-10-04 NOTE — Progress Notes (Signed)
Subjective: 66 year old male presents the office today to drop off his MRI report, but I also went ahead and saw him today to review this.  Patient states the injection was very helpful last appointment.  He is asking for another one today as well.  No recent injury or changes or any new concerns saw him.    Objective: AAO x3, NAD DP/PT pulses palpable bilaterally, CRT less than 3 seconds Continuation of tenderness along the sinus tarsi area.  There is significant flatfoot present with decreased range of motion of the subtalar joint.  There is no significant discomfort along the ankle joint range of motion.  No area pinpoint tenderness noted.  Chronic low warmth. No pain with calf compression, swelling, warmth, erythema  Assessment: Foot deformity with arthritis; fracture anterior process calcaneus where there is prominent cystic changes.  Plan: -All treatment options discussed with the patient including all alternatives, risks, complications.  -I viewed the MRI with the patient which revealed marked degenerative changes in the midfoot and hindfoot.  Findings suggestive of synovitis involving the tibiotalar and subtalar joints with some abrasions.  There is tibiotalar joint space narrowing.  I discussed in regards to the surgical option the patient.  Discussed triple arthrodesis, concerned that the ankle joint.  I like him to get another opinion.  I recommended Dr. Lucita Ferrara at [facility] and referral was placed for this. -Patient is requesting steroid injection.  Skin was cleaned alcohol and Betadine mixture 1 cc Kenalog 10, 0.5 cc Marcaine plain, 0.5 cc of lidocaine plain was infiltrated into the sinus tarsi without complications.  Postinjection care was discussed.  Tolerated well.  Discussed fracture of the anterior process of calcaneus which is above result of the cystic changes,.  There is a history of an injury to the area as well.  Pain is more diffuse and not just localized to this  area. -We will check uric acid level. -Patient encouraged to call the office with any questions, concerns, change in symptoms.   Vivi Barrack DPM

## 2021-11-01 NOTE — Telephone Encounter (Signed)
-----   Message from Stann Ore, LPN sent at 12/12/32  4:47 PM EDT ----- 05/17 patient was contacted and he stated he needed an open mri because he was extremely claustophic.  ----- Message ----- From: Vivi Barrack, DPM Sent: 10/04/2021   7:17 AM EDT To: Hester Mates, RMA; Stann Ore, LPN  I saw this patient last week and recommended referral to Dr. Lucita Ferrara at [facility] orthopedics.  Could you please fax the referral.  This order was placed follow-up in the chart it was not there so I replaced the order.  Can you please fax the referral, clinic notes, MRI which I have a copy of.

## 2022-07-05 ENCOUNTER — Other Ambulatory Visit: Payer: Self-pay | Admitting: Pain Medicine

## 2022-07-05 DIAGNOSIS — I10 Essential (primary) hypertension: Secondary | ICD-10-CM

## 2022-07-31 ENCOUNTER — Ambulatory Visit: Payer: Medicare Other

## 2022-07-31 DIAGNOSIS — I10 Essential (primary) hypertension: Secondary | ICD-10-CM

## 2022-08-31 ENCOUNTER — Other Ambulatory Visit: Payer: Self-pay | Admitting: Orthopedic Surgery

## 2022-08-31 DIAGNOSIS — M19212 Secondary osteoarthritis, left shoulder: Secondary | ICD-10-CM

## 2022-09-28 ENCOUNTER — Ambulatory Visit
Admission: RE | Admit: 2022-09-28 | Discharge: 2022-09-28 | Disposition: A | Discharge status: Home / Self Care | Payer: Medicare Other | Source: Ambulatory Visit | Attending: Orthopedic Surgery | Admitting: Orthopedic Surgery

## 2022-09-28 DIAGNOSIS — M19212 Secondary osteoarthritis, left shoulder: Secondary | ICD-10-CM

## 2022-10-05 ENCOUNTER — Other Ambulatory Visit: Payer: Self-pay

## 2022-10-05 ENCOUNTER — Emergency Department (HOSPITAL_BASED_OUTPATIENT_CLINIC_OR_DEPARTMENT_OTHER): Payer: Medicare Other

## 2022-10-05 ENCOUNTER — Emergency Department (HOSPITAL_BASED_OUTPATIENT_CLINIC_OR_DEPARTMENT_OTHER)
Admission: EM | Admit: 2022-10-05 | Discharge: 2022-10-05 | Disposition: A | Discharge status: Home / Self Care | Payer: Medicare Other | Source: Home / Self Care | Attending: Emergency Medicine | Admitting: Emergency Medicine

## 2022-10-05 DIAGNOSIS — R112 Nausea with vomiting, unspecified: Secondary | ICD-10-CM

## 2022-10-05 DIAGNOSIS — K56609 Unspecified intestinal obstruction, unspecified as to partial versus complete obstruction: Secondary | ICD-10-CM | POA: Diagnosis not present

## 2022-10-05 DIAGNOSIS — I10 Essential (primary) hypertension: Secondary | ICD-10-CM | POA: Insufficient documentation

## 2022-10-05 DIAGNOSIS — Z7982 Long term (current) use of aspirin: Secondary | ICD-10-CM | POA: Insufficient documentation

## 2022-10-05 DIAGNOSIS — Z79899 Other long term (current) drug therapy: Secondary | ICD-10-CM | POA: Insufficient documentation

## 2022-10-05 DIAGNOSIS — N3001 Acute cystitis with hematuria: Secondary | ICD-10-CM | POA: Insufficient documentation

## 2022-10-05 DIAGNOSIS — K56600 Partial intestinal obstruction, unspecified as to cause: Secondary | ICD-10-CM | POA: Diagnosis not present

## 2022-10-05 DIAGNOSIS — E871 Hypo-osmolality and hyponatremia: Secondary | ICD-10-CM | POA: Insufficient documentation

## 2022-10-05 LAB — COMPREHENSIVE METABOLIC PANEL
ALT: 43 U/L (ref 0–44)
AST: 28 U/L (ref 15–41)
Albumin: 3.9 g/dL (ref 3.5–5.0)
Alkaline Phosphatase: 60 U/L (ref 38–126)
Anion gap: 11 (ref 5–15)
BUN: 19 mg/dL (ref 8–23)
CO2: 28 mmol/L (ref 22–32)
Calcium: 9.6 mg/dL (ref 8.9–10.3)
Chloride: 92 mmol/L — ABNORMAL LOW (ref 98–111)
Creatinine, Ser: 0.72 mg/dL (ref 0.61–1.24)
GFR, Estimated: >60 mL/min (ref >60)
Glucose, Bld: 118 mg/dL — ABNORMAL HIGH (ref 70–99)
Potassium: 3.9 mmol/L (ref 3.5–5.1)
Sodium: 131 mmol/L — ABNORMAL LOW (ref 135–145)
Total Bilirubin: 0.7 mg/dL (ref 0.3–1.2)
Total Protein: 6.7 g/dL (ref 6.5–8.1)

## 2022-10-05 LAB — CBC
HCT: 41.3 % (ref 39.0–52.0)
Hemoglobin: 13.8 g/dL (ref 13.0–17.0)
MCH: 28.7 pg (ref 26.0–34.0)
MCHC: 33.4 g/dL (ref 30.0–36.0)
MCV: 85.9 fL (ref 80.0–100.0)
Platelets: 310 10*3/uL (ref 150–400)
RBC: 4.81 MIL/uL (ref 4.22–5.81)
RDW: 13.4 % (ref 11.5–15.5)
WBC: 7 10*3/uL (ref 4.0–10.5)
nRBC: 0 % (ref 0.0–0.2)

## 2022-10-05 LAB — URINALYSIS, ROUTINE W REFLEX MICROSCOPIC
Bilirubin Urine: NEGATIVE
Glucose, UA: NEGATIVE mg/dL
Ketones, ur: NEGATIVE mg/dL
Nitrite: NEGATIVE
Protein, ur: NEGATIVE mg/dL
Specific Gravity, Urine: 1.015 (ref 1.005–1.030)
pH: 6.5 (ref 5.0–8.0)

## 2022-10-05 LAB — URINALYSIS, MICROSCOPIC (REFLEX)

## 2022-10-05 LAB — LIPASE, BLOOD: Lipase: 32 U/L (ref 11–51)

## 2022-10-05 MED ORDER — ONDANSETRON HCL 4 MG PO TABS: 4.0000 mg | ORAL_TABLET | Freq: Three times a day (TID) | ORAL | 0 refills | Status: DC | PRN

## 2022-10-05 MED ORDER — CEPHALEXIN 500 MG PO CAPS: 500.0000 mg | ORAL_CAPSULE | Freq: Four times a day (QID) | ORAL | 0 refills | Status: DC

## 2022-10-05 MED ORDER — SODIUM CHLORIDE 0.9 % IV BOLUS
500.0000 mL | Freq: Once | INTRAVENOUS | Status: AC
  Administered 2022-10-05: 500 mL via INTRAVENOUS

## 2022-10-05 MED ORDER — ONDANSETRON HCL 4 MG/2ML IJ SOLN
4.0000 mg | Freq: Once | INTRAMUSCULAR | Status: AC
  Administered 2022-10-05: 4 mg via INTRAVENOUS
  Filled 2022-10-05: qty 2

## 2022-10-05 NOTE — ED Notes (Signed)
Discharge paperwork reviewed entirely with patient, including follow up care. Pain was under control. The patient received instruction and coaching on their prescriptions, and all follow-up questions were answered.  Pt verbalized understanding as well as all parties involved. No questions or concerns voiced at the time of discharge. No acute distress noted.   Pt was wheeled out to the PVA in a wheelchair without incident.  

## 2022-10-05 NOTE — ED Provider Notes (Signed)
[facility] Provider Note   CSN: [id number] Arrival date & time: 10/05/22  1108     History  Chief Complaint  Patient presents with   Emesis    [name] is a 67 y.o. male with a past medical history of hypertension, who presents emergency department with concerns for emesis onset 2 days ago.  Notes he has had approximately 6-7 episodes of emesis.  Has had some dry heaving episodes as well.  His last episode of emesis was yesterday. Notes that he has abdominal pain when he is dry heaving or having emesis otherwise no abdominal pain. Last bowel movement was today and was normal for him.  Was treated for UTI 3 weeks ago with Keflex and completed the entire course of the antibiotics.  No additional medications tried at home.  Denies sick contacts.  Denies fever, chest pain, shortness of breath, urinary symptoms, abdominal pain, diarrhea, constipation, nausea.     The history is provided by the patient. No language interpreter was used.       Home Medications Prior to Admission medications   Medication Sig Start Date End Date Taking? Authorizing Provider  amLODipine (NORVASC) 10 MG tablet Take 10 mg by mouth daily. 09/20/22  Yes [provider]  cephALEXin (KEFLEX) 500 MG capsule Take 1 capsule (500 mg total) by mouth 4 (four) times daily for 7 days. 10/05/22 10/12/22 Yes Zyanna Leisinger A, PA-C  losartan-hydrochlorothiazide (HYZAAR) 100-25 MG tablet Take 1 tablet by mouth daily. 09/25/22  Yes [provider]  metoprolol succinate (TOPROL-XL) 100 MG 24 hr tablet Take 150 mg by mouth daily. 08/27/22  Yes [provider]  ondansetron (ZOFRAN) 4 MG tablet Take 1 tablet (4 mg total) by mouth every 8 (eight) hours as needed for nausea or vomiting. 10/05/22  Yes Lawson Mahone A, PA-C  ALPRAZolam (XANAX) 0.5 MG tablet Take 1 tablet (0.5 mg total) by mouth as needed for anxiety (for sedation before MRI scan; take 1 hour before  scan; may repeat 15 min before scan). 12/20/17   Penumalli, Glenford Bayley, MD  aspirin 325 MG EC tablet Take 325 mg by mouth daily.    [provider]  atorvastatin (LIPITOR) 40 MG tablet Take 40 mg by mouth every morning.     [provider]  celecoxib (CELEBREX) 200 MG capsule Take 400 mg by mouth daily. Takes 2 tablets    [provider]  Cholecalciferol 2000 units CAPS Take 4,000 Units by mouth daily. Takes 2 tablets    [provider]  Coenzyme Q10 (COQ10) 100 MG CAPS Take 200 mg by mouth daily. Takes 2 tablets    [provider]  diphenhydramine-acetaminophen (TYLENOL PM) 25-500 MG TABS Take 2 tablets by mouth at bedtime.     [provider]  docusate sodium (COLACE) 100 MG capsule Take 300 mg by mouth daily. Takes 3 tablets    [provider]  losartan (COZAAR) 100 MG tablet Take 100 mg by mouth daily.    [provider]  methocarbamol (ROBAXIN) 750 MG tablet Take 325 mg by mouth every 8 (eight) hours. Takes 0.5 tablet 05/15/15   [provider]  Multiple Vitamins-Minerals (MULTIVITAMINS THER. W/MINERALS) TABS Take 1 tablet by mouth daily.      [provider]  oxyCODONE-acetaminophen (PERCOCET) 10-325 MG tablet Take 1 tablet by mouth every 4 (four) hours as needed.    [provider]  oxymetazoline (AFRIN) 0.05 % nasal spray Place  1 spray into both nostrils at bedtime as needed for congestion.    [provider]  sildenafil (VIAGRA) 100 MG tablet Take 100 mg by mouth as needed for erectile dysfunction.    [provider]      Allergies    Patient has no known allergies.    Review of Systems   Review of Systems  Respiratory:  Negative for shortness of breath.   Cardiovascular:  Negative for chest pain.  Gastrointestinal:  Positive for vomiting. Negative for abdominal pain (with emesis only), constipation, diarrhea and nausea.  Genitourinary:  Negative for dysuria and hematuria.   All other systems reviewed and are negative.   Physical Exam Updated Vital Signs BP (!) 144/81 (BP Location: Left Arm)   Pulse 86   Temp 98.1 F (36.7 C) (Oral)   Resp 18   Ht 5\' 11"  (1.803 m)   Wt (!) 143.3 kg   SpO2 94%   BMI 44.07 kg/m  Physical Exam Vitals and nursing note reviewed.  Constitutional:      General: He is not in acute distress.    Appearance: He is not diaphoretic.  HENT:     Head: Normocephalic and atraumatic.     Mouth/Throat:     Pharynx: No oropharyngeal exudate.  Eyes:     General: No scleral icterus.    Conjunctiva/sclera: Conjunctivae normal.  Cardiovascular:     Rate and Rhythm: Normal rate and regular rhythm.     Pulses: Normal pulses.     Heart sounds: Normal heart sounds.  Pulmonary:     Effort: Pulmonary effort is normal. No respiratory distress.     Breath sounds: Normal breath sounds. No wheezing.  Abdominal:     General: Bowel sounds are normal.     Palpations: Abdomen is soft. There is no mass.     Tenderness: There is no abdominal tenderness. There is no guarding or rebound.     Comments: No abdominal tenderness to palpation noted on exam.  Musculoskeletal:        General: Normal range of motion.     Cervical back: Normal range of motion and neck supple.  Skin:    General: Skin is warm and dry.  Neurological:     Mental Status: He is alert.  Psychiatric:        Behavior: Behavior normal.     ED Results / Procedures / Treatments   Labs (all labs ordered are listed, but only abnormal results are displayed) Labs Reviewed  COMPREHENSIVE METABOLIC PANEL - Abnormal; Notable for the following components:      Result Value   Sodium 131 (*)    Chloride 92 (*)    Glucose, Bld 118 (*)    All other components within normal limits  URINALYSIS, ROUTINE W REFLEX MICROSCOPIC - Abnormal; Notable for the following components:   Hgb urine dipstick TRACE (*)    Leukocytes,Ua MODERATE (*)    All other components within normal limits   URINALYSIS, MICROSCOPIC (REFLEX) - Abnormal; Notable for the following components:   Bacteria, UA MANY (*)    All other components within normal limits  URINE CULTURE  LIPASE, BLOOD  CBC    EKG None  Radiology No results found.  Procedures Procedures    Medications Ordered in ED Medications  sodium chloride 0.9 % bolus 500 mL (0 mLs Intravenous Stopped 10/05/22 1220)  ondansetron (ZOFRAN) injection 4 mg (4 mg Intravenous Given 10/05/22 1146)  sodium chloride 0.9 % bolus 500 mL (0  mLs Intravenous Stopped 10/05/22 1415)    ED Course/ Medical Decision Making/ A&P Clinical Course as of 10/05/22 1716  Thu Oct 05, 2022  1309 Pt re-evaluated and resting comfortably on stretcher. Discussed with patient lab and imaging findings. Discussed with patient treatment plan. Answered all available questions.  [SB]  1340 Pt re-evaluated and resting comfortably on stretcher. Discussed with patient lab findings.  Offered CT abdomen pelvis today, patient declines at this time.  Patient without abdominal tenderness to palpation noted on exam, patient afebrile, no leukocytosis noted today.  Discussed with patient treatment plan. Answered all available questions.  [SB]    Clinical Course User Index [SB] Jsean Taussig A, PA-C                             Medical Decision Making Amount and/or Complexity of Data Reviewed Labs: ordered. Radiology: ordered.  Risk Prescription drug management.   Patient presents to the emergency department with emesis x 2 days.  Denies sick contacts.  Patient afebrile.  On exam patient without acute abdominal tenderness to palpation.  Remainder of exam without acute findings.  Differential diagnosis includes diverticulitis, pancreatitis, appendicitis, viral etiology.   Additional history obtained:  Additional history obtained from Spouse/Significant Other  Labs:  I ordered, and personally interpreted labs.  The pertinent results include:  Lipase  unremarkable CBC without leukocytosis and unremarkable CMP with slightly decreased sodium at 131, glucose slightly elevated at 118 otherwise unremarkable Urinalysis notable for trace of hemoglobin, moderate leukocytes, many bacteria on microscopic urinalysis.  Urine culture ordered with results pending at time of discharge   Medications:  I ordered medication including IVF and Zofran for symptom management.  Reevaluation of the patient after these medicines and interventions, I reevaluated the patient and found that they have improved I have reviewed the patients home medicines and have made adjustments as needed Tolerated PO challenge in ED with above treatment regimen    Disposition: Please do suspicious for viral etiology as cause of patient's symptoms.  No concerns at this time for diverticulitis, appendicitis, pancreatitis, pyelonephritis, nephrolithiasis.  Also notable for acute cystitis.  Urine culture sent at this time.. After consideration of the diagnostic results and the patients response to treatment, I feel that the patient would benefit from Discharge home.  Patient discharged home with a prescription for Zofran and Keflex. Will provide information for on-call Gastroenterologist. Instructed pt to call and set up follow up appointment regarding todays ED visit with the gastroenterologist. Supportive care measures and strict return precautions discussed with patient at bedside. Pt acknowledges and verbalizes understanding. Pt appears safe for discharge. Follow up as indicated in discharge paperwork.   This chart was dictated using voice recognition software, Dragon. Despite the best efforts of this provider to proofread and correct errors, errors may still occur which can change documentation meaning.   Final Clinical Impression(s) / ED Diagnoses Final diagnoses:  Nausea and vomiting, unspecified vomiting type  Acute cystitis with hematuria    Rx / DC Orders ED Discharge Orders           Ordered    cephALEXin (KEFLEX) 500 MG capsule  4 times daily        10/05/22 1416    ondansetron (ZOFRAN) 4 MG tablet  Every 8 hours PRN        10/05/22 1417              Miriam Liles A, PA-C 10/05/22 1717  Alvira Monday, MD 10/07/22 [phone]

## 2022-10-05 NOTE — Discharge Instructions (Addendum)
It was a pleasure taking care of you today!  Your lab and imaging studies did not show any concerning emergent findings at this time.  Your urine showed concerns for possible UTI.  You have a urine culture that is pending.  You will be sent a prescription for Zofran, take as directed if you are experiencing nausea/vomiting.  If you have to take Zofran, wait at least 30 minutes before you attempt small sips/small bites of food/fluid.  Ensure to maintain fluid intake with water, tea, broth, soup, Pedialyte, Gatorade.  You will be sent a prescription for Keflex, take as directed.  Follow-up with your primary care provider as needed regarding this ED visit.  Attached is information for the on-call GI specialist, you may call and set up a follow-up appointment regarding today's ED visit.  Return to the emergency department if you experience increasing/worsening symptoms.

## 2022-10-05 NOTE — ED Triage Notes (Signed)
Reports vomiting 8 times since Tuesday and weakness. Reports dark urine

## 2022-10-06 ENCOUNTER — Other Ambulatory Visit: Payer: Self-pay | Admitting: Orthopedic Surgery

## 2022-10-06 LAB — URINE CULTURE: Culture: >=100000 — AB

## 2022-10-07 ENCOUNTER — Encounter (HOSPITAL_COMMUNITY): Payer: Self-pay | Admitting: Emergency Medicine

## 2022-10-07 ENCOUNTER — Inpatient Hospital Stay (HOSPITAL_COMMUNITY): Payer: Medicare Other

## 2022-10-07 ENCOUNTER — Inpatient Hospital Stay (HOSPITAL_COMMUNITY)
Admission: EM | Admit: 2022-10-07 | Discharge: 2022-10-11 | DRG: 389 | Disposition: A | Discharge status: Home / Self Care | Payer: Medicare Other | Attending: Family Medicine | Admitting: Family Medicine

## 2022-10-07 ENCOUNTER — Emergency Department (HOSPITAL_COMMUNITY): Payer: Medicare Other

## 2022-10-07 ENCOUNTER — Other Ambulatory Visit: Payer: Self-pay

## 2022-10-07 DIAGNOSIS — G4733 Obstructive sleep apnea (adult) (pediatric): Secondary | ICD-10-CM | POA: Diagnosis present

## 2022-10-07 DIAGNOSIS — R112 Nausea with vomiting, unspecified: Secondary | ICD-10-CM

## 2022-10-07 DIAGNOSIS — E860 Dehydration: Secondary | ICD-10-CM | POA: Diagnosis present

## 2022-10-07 DIAGNOSIS — G8929 Other chronic pain: Secondary | ICD-10-CM | POA: Diagnosis present

## 2022-10-07 DIAGNOSIS — E871 Hypo-osmolality and hyponatremia: Secondary | ICD-10-CM | POA: Diagnosis present

## 2022-10-07 DIAGNOSIS — N281 Cyst of kidney, acquired: Secondary | ICD-10-CM | POA: Diagnosis present

## 2022-10-07 DIAGNOSIS — Z7982 Long term (current) use of aspirin: Secondary | ICD-10-CM | POA: Diagnosis not present

## 2022-10-07 DIAGNOSIS — Z881 Allergy status to other antibiotic agents status: Secondary | ICD-10-CM

## 2022-10-07 DIAGNOSIS — K5730 Diverticulosis of large intestine without perforation or abscess without bleeding: Secondary | ICD-10-CM | POA: Diagnosis present

## 2022-10-07 DIAGNOSIS — K56600 Partial intestinal obstruction, unspecified as to cause: Secondary | ICD-10-CM | POA: Diagnosis present

## 2022-10-07 DIAGNOSIS — Z6841 Body Mass Index (BMI) 40.0 and over, adult: Secondary | ICD-10-CM

## 2022-10-07 DIAGNOSIS — Z9989 Dependence on other enabling machines and devices: Secondary | ICD-10-CM

## 2022-10-07 DIAGNOSIS — I10 Essential (primary) hypertension: Secondary | ICD-10-CM | POA: Diagnosis present

## 2022-10-07 DIAGNOSIS — E861 Hypovolemia: Secondary | ICD-10-CM | POA: Diagnosis present

## 2022-10-07 DIAGNOSIS — N2889 Other specified disorders of kidney and ureter: Secondary | ICD-10-CM | POA: Diagnosis present

## 2022-10-07 DIAGNOSIS — E785 Hyperlipidemia, unspecified: Secondary | ICD-10-CM | POA: Diagnosis present

## 2022-10-07 DIAGNOSIS — Z791 Long term (current) use of non-steroidal anti-inflammatories (NSAID): Secondary | ICD-10-CM

## 2022-10-07 DIAGNOSIS — M542 Cervicalgia: Secondary | ICD-10-CM | POA: Diagnosis present

## 2022-10-07 DIAGNOSIS — E876 Hypokalemia: Secondary | ICD-10-CM | POA: Diagnosis present

## 2022-10-07 DIAGNOSIS — Z79899 Other long term (current) drug therapy: Secondary | ICD-10-CM | POA: Diagnosis not present

## 2022-10-07 DIAGNOSIS — K56609 Unspecified intestinal obstruction, unspecified as to partial versus complete obstruction: Secondary | ICD-10-CM

## 2022-10-07 DIAGNOSIS — M545 Low back pain, unspecified: Secondary | ICD-10-CM | POA: Diagnosis present

## 2022-10-07 DIAGNOSIS — K567 Ileus, unspecified: Secondary | ICD-10-CM | POA: Diagnosis present

## 2022-10-07 DIAGNOSIS — Z8249 Family history of ischemic heart disease and other diseases of the circulatory system: Secondary | ICD-10-CM

## 2022-10-07 DIAGNOSIS — K429 Umbilical hernia without obstruction or gangrene: Secondary | ICD-10-CM | POA: Diagnosis present

## 2022-10-07 DIAGNOSIS — M75102 Unspecified rotator cuff tear or rupture of left shoulder, not specified as traumatic: Secondary | ICD-10-CM | POA: Diagnosis present

## 2022-10-07 DIAGNOSIS — Z96653 Presence of artificial knee joint, bilateral: Secondary | ICD-10-CM | POA: Diagnosis present

## 2022-10-07 DIAGNOSIS — N3001 Acute cystitis with hematuria: Secondary | ICD-10-CM | POA: Diagnosis present

## 2022-10-07 DIAGNOSIS — Z981 Arthrodesis status: Secondary | ICD-10-CM

## 2022-10-07 DIAGNOSIS — B9620 Unspecified Escherichia coli [E. coli] as the cause of diseases classified elsewhere: Secondary | ICD-10-CM | POA: Diagnosis present

## 2022-10-07 DIAGNOSIS — Z833 Family history of diabetes mellitus: Secondary | ICD-10-CM

## 2022-10-07 DIAGNOSIS — Z96641 Presence of right artificial hip joint: Secondary | ICD-10-CM | POA: Diagnosis present

## 2022-10-07 LAB — RAPID URINE DRUG SCREEN, HOSP PERFORMED
Amphetamines: NOT DETECTED
Barbiturates: NOT DETECTED
Benzodiazepines: NOT DETECTED
Cocaine: NOT DETECTED
Opiates: POSITIVE — AB
Tetrahydrocannabinol: NOT DETECTED

## 2022-10-07 LAB — URINE CULTURE

## 2022-10-07 LAB — CBC WITH DIFFERENTIAL/PLATELET
Abs Immature Granulocytes: 0.01 10*3/uL (ref 0.00–0.07)
Basophils Absolute: 0 10*3/uL (ref 0.0–0.1)
Basophils Relative: 1 %
Eosinophils Absolute: 0 10*3/uL (ref 0.0–0.5)
Eosinophils Relative: 1 %
HCT: 41.4 % (ref 39.0–52.0)
Hemoglobin: 13.8 g/dL (ref 13.0–17.0)
Immature Granulocytes: 0 %
Lymphocytes Relative: 17 %
Lymphs Abs: 1 10*3/uL (ref 0.7–4.0)
MCH: 28.5 pg (ref 26.0–34.0)
MCHC: 33.3 g/dL (ref 30.0–36.0)
MCV: 85.5 fL (ref 80.0–100.0)
Monocytes Absolute: 1.3 10*3/uL — ABNORMAL HIGH (ref 0.1–1.0)
Monocytes Relative: 21 %
Neutro Abs: 3.6 10*3/uL (ref 1.7–7.7)
Neutrophils Relative %: 60 %
Platelets: 332 10*3/uL (ref 150–400)
RBC: 4.84 MIL/uL (ref 4.22–5.81)
RDW: 13 % (ref 11.5–15.5)
WBC: 6 10*3/uL (ref 4.0–10.5)
nRBC: 0 % (ref 0.0–0.2)

## 2022-10-07 LAB — URINALYSIS, ROUTINE W REFLEX MICROSCOPIC
Bilirubin Urine: NEGATIVE
Glucose, UA: NEGATIVE mg/dL
Hgb urine dipstick: NEGATIVE
Ketones, ur: 80 mg/dL — AB
Leukocytes,Ua: NEGATIVE
Nitrite: NEGATIVE
Protein, ur: NEGATIVE mg/dL
Specific Gravity, Urine: 1.034 — ABNORMAL HIGH (ref 1.005–1.030)
pH: 6 (ref 5.0–8.0)

## 2022-10-07 LAB — LIPASE, BLOOD: Lipase: 45 U/L (ref 11–51)

## 2022-10-07 LAB — COMPREHENSIVE METABOLIC PANEL
ALT: 37 U/L (ref 0–44)
AST: 19 U/L (ref 15–41)
Albumin: 3.4 g/dL — ABNORMAL LOW (ref 3.5–5.0)
Alkaline Phosphatase: 56 U/L (ref 38–126)
Anion gap: 12 (ref 5–15)
BUN: 15 mg/dL (ref 8–23)
CO2: 29 mmol/L (ref 22–32)
Calcium: 8.6 mg/dL — ABNORMAL LOW (ref 8.9–10.3)
Chloride: 86 mmol/L — ABNORMAL LOW (ref 98–111)
Creatinine, Ser: 0.77 mg/dL (ref 0.61–1.24)
GFR, Estimated: >60 mL/min (ref >60)
Glucose, Bld: 107 mg/dL — ABNORMAL HIGH (ref 70–99)
Potassium: 3.3 mmol/L — ABNORMAL LOW (ref 3.5–5.1)
Sodium: 127 mmol/L — ABNORMAL LOW (ref 135–145)
Total Bilirubin: 0.9 mg/dL (ref 0.3–1.2)
Total Protein: 7.1 g/dL (ref 6.5–8.1)

## 2022-10-07 LAB — GLUCOSE, CAPILLARY
Glucose-Capillary: 99 mg/dL (ref 70–99)
Glucose-Capillary: 94 mg/dL (ref 70–99)

## 2022-10-07 LAB — MAGNESIUM: Magnesium: 1.9 mg/dL (ref 1.7–2.4)

## 2022-10-07 LAB — LACTIC ACID, PLASMA: Lactic Acid, Venous: 1.1 mmol/L (ref 0.5–1.9)

## 2022-10-07 MED ORDER — SODIUM CHLORIDE 0.9 % IV SOLN
3.0000 g | Freq: Four times a day (QID) | INTRAVENOUS | Status: DC
  Administered 2022-10-07 – 2022-10-09 (×8): 3 g via INTRAVENOUS
  Filled 2022-10-07 (×9): qty 8

## 2022-10-07 MED ORDER — SODIUM CHLORIDE 0.9 % IV SOLN: 1.0000 g | Freq: Once | INTRAVENOUS | Status: DC

## 2022-10-07 MED ORDER — PANTOPRAZOLE SODIUM 40 MG IV SOLR
40.0000 mg | INTRAVENOUS | Status: DC
  Administered 2022-10-07 – 2022-10-11 (×5): 40 mg via INTRAVENOUS
  Filled 2022-10-07 (×5): qty 10

## 2022-10-07 MED ORDER — PROCHLORPERAZINE EDISYLATE 10 MG/2ML IJ SOLN
10.0000 mg | Freq: Once | INTRAMUSCULAR | Status: AC
  Administered 2022-10-07: 10 mg via INTRAVENOUS
  Filled 2022-10-07: qty 2

## 2022-10-07 MED ORDER — METOPROLOL TARTRATE 5 MG/5ML IV SOLN
5.0000 mg | Freq: Four times a day (QID) | INTRAVENOUS | Status: DC
  Administered 2022-10-07 – 2022-10-11 (×17): 5 mg via INTRAVENOUS
  Filled 2022-10-07 (×17): qty 5

## 2022-10-07 MED ORDER — ONDANSETRON HCL 4 MG/2ML IJ SOLN
4.0000 mg | Freq: Once | INTRAMUSCULAR | Status: AC
  Administered 2022-10-07: 4 mg via INTRAVENOUS
  Filled 2022-10-07: qty 2

## 2022-10-07 MED ORDER — SODIUM CHLORIDE 0.9 % IV SOLN
12.5000 mg | Freq: Once | INTRAVENOUS | Status: AC
  Administered 2022-10-07: 12.5 mg via INTRAVENOUS
  Filled 2022-10-07: qty 0.5

## 2022-10-07 MED ORDER — DIATRIZOATE MEGLUMINE & SODIUM 66-10 % PO SOLN
90.0000 mL | Freq: Once | ORAL | Status: AC
  Administered 2022-10-07: 90 mL via NASOGASTRIC
  Filled 2022-10-07: qty 90

## 2022-10-07 MED ORDER — ENOXAPARIN SODIUM 80 MG/0.8ML IJ SOSY
70.0000 mg | PREFILLED_SYRINGE | Freq: Every day | INTRAMUSCULAR | Status: DC
  Administered 2022-10-07 – 2022-10-10 (×4): 70 mg via SUBCUTANEOUS
  Filled 2022-10-07 (×5): qty 0.8

## 2022-10-07 MED ORDER — POTASSIUM CHLORIDE CRYS ER 20 MEQ PO TBCR: 40.0000 meq | EXTENDED_RELEASE_TABLET | Freq: Once | ORAL | Status: DC

## 2022-10-07 MED ORDER — ONDANSETRON HCL 4 MG/2ML IJ SOLN
4.0000 mg | Freq: Four times a day (QID) | INTRAMUSCULAR | Status: DC | PRN
  Administered 2022-10-07 – 2022-10-10 (×8): 4 mg via INTRAVENOUS
  Filled 2022-10-07 (×8): qty 2

## 2022-10-07 MED ORDER — IOHEXOL 300 MG/ML  SOLN
100.0000 mL | Freq: Once | INTRAMUSCULAR | Status: AC | PRN
  Administered 2022-10-07: 100 mL via INTRAVENOUS

## 2022-10-07 MED ORDER — SODIUM CHLORIDE 0.9 % IV BOLUS
1000.0000 mL | Freq: Once | INTRAVENOUS | Status: AC
  Administered 2022-10-07: 1000 mL via INTRAVENOUS

## 2022-10-07 MED ORDER — MORPHINE SULFATE (PF) 2 MG/ML IV SOLN
2.0000 mg | INTRAVENOUS | Status: DC | PRN
  Administered 2022-10-07 – 2022-10-10 (×12): 2 mg via INTRAVENOUS
  Filled 2022-10-07 (×12): qty 1

## 2022-10-07 MED ORDER — POTASSIUM CHLORIDE 10 MEQ/100ML IV SOLN
10.0000 meq | INTRAVENOUS | Status: AC
  Administered 2022-10-07 (×3): 10 meq via INTRAVENOUS
  Filled 2022-10-07 (×3): qty 100

## 2022-10-07 MED ORDER — HYDRALAZINE HCL 20 MG/ML IJ SOLN
10.0000 mg | INTRAMUSCULAR | Status: DC | PRN
  Administered 2022-10-08 – 2022-10-09 (×2): 10 mg via INTRAVENOUS
  Filled 2022-10-07 (×2): qty 1

## 2022-10-07 MED ORDER — HYDROMORPHONE HCL 1 MG/ML IJ SOLN
1.0000 mg | Freq: Once | INTRAMUSCULAR | Status: AC
  Administered 2022-10-07: 1 mg via INTRAVENOUS
  Filled 2022-10-07: qty 1

## 2022-10-07 MED ORDER — SODIUM CHLORIDE 0.9 % IV SOLN: INTRAVENOUS | Status: DC

## 2022-10-07 MED ORDER — MORPHINE SULFATE (PF) 4 MG/ML IV SOLN
4.0000 mg | Freq: Once | INTRAVENOUS | Status: AC
  Administered 2022-10-07: 4 mg via INTRAVENOUS
  Filled 2022-10-07: qty 1

## 2022-10-07 NOTE — ED Notes (Signed)
Pt requesting pain and nausea meds before NG placement. PA made aware.

## 2022-10-07 NOTE — ED Triage Notes (Signed)
Patient reports n/v x4 days Fullness feeling in abdomen Recently diagnosed with UTI Took Keflex, had nausea with Keflex States on Thursday this week keflex was restarted due to residual UTI Denies fever, endorses chills

## 2022-10-07 NOTE — ED Provider Notes (Signed)
[name] AT [facility] Provider Note   CSN: [id number] Arrival date & time: 10/07/22  4098     History  Chief Complaint  Patient presents with   Emesis    [name] is a 67 y.o. male.   Emesis   67 year old male presents emergency [facility] with complaints of nausea, vomiting, "fullness" in abdomen.  Patient states that he has been dealing with nausea and vomiting for the past 4 days.  Was recently seen in the emergency [facility] 2 days ago for the same presentation and states his symptoms have persisted since discharge.  Patient states that he was recently diagnosed with a urinary tract infection proximately 2 weeks ago with completion of antibiotics at that time; he states that he felt nauseated with sparse episodes of emesis while taking the antibiotic before resolution upon stopping.  States his symptoms began again approximately 4 days ago.  States that around 3-4 days ago, he stopped taking his chronic pain medication in the form of oxycodone of which she had been on for a few years for back pain.  States that he also was again started on antibiotics 2 days ago from prior emergency [facility] visit in form of Keflex and is unaware of whether or not he is having a continued adverse effect antibiotics or having withdrawal effects from stopping opioid medication or something else going on in his abdomen.  Reports some feelings of abdominal fullness with last bowel movement this past Wednesday. Reports intermittent diffuse abdominal pain closely related to bouts of emesis.  Denies fever, chills, chest pain, shortness of breath.  Patient states that he is currently without dysuria, urinary frequency, urgency.  Past medical history significant for hypertension, chronic pain, hyperlipidemia, OSA  Home Medications Prior to Admission medications   Medication Sig Start Date End Date Taking? Authorizing Provider  ondansetron (ZOFRAN) 4 MG tablet Take 1  tablet (4 mg total) by mouth every 8 (eight) hours as needed for nausea or vomiting. 10/05/22  Yes Blue, Soijett A, PA-C  ALPRAZolam (XANAX) 0.5 MG tablet Take 1 tablet (0.5 mg total) by mouth as needed for anxiety (for sedation before MRI scan; take 1 hour before scan; may repeat 15 min before scan). Patient not taking: Reported on 10/07/2022 12/20/17   Penumalli, Glenford Bayley, MD  amLODipine (NORVASC) 10 MG tablet Take 10 mg by mouth daily. 09/20/22   [provider]  aspirin 325 MG EC tablet Take 325 mg by mouth daily.    [provider]  atorvastatin (LIPITOR) 40 MG tablet Take 40 mg by mouth every morning.     [provider]  celecoxib (CELEBREX) 200 MG capsule Take 400 mg by mouth daily.    [provider]  cephALEXin (KEFLEX) 500 MG capsule Take 1 capsule (500 mg total) by mouth 4 (four) times daily for 7 days. 10/05/22 10/12/22  Blue, Soijett A, PA-C  Cholecalciferol 2000 units CAPS Take 4,000 Units by mouth daily. Takes 2 tablets    [provider]  Coenzyme Q10 (COQ10) 100 MG CAPS Take 200 mg by mouth daily. Takes 2 tablets    [provider]  diphenhydramine-acetaminophen (TYLENOL PM) 25-500 MG TABS Take 2 tablets by mouth at bedtime.     [provider]  docusate sodium (COLACE) 100 MG capsule Take 300 mg by mouth daily. Takes 3 tablets    [provider]  losartan (COZAAR) 100 MG tablet Take 100 mg by mouth daily. Patient not taking: Reported  on 10/07/2022    [provider]  losartan-hydrochlorothiazide (HYZAAR) 100-25 MG tablet Take 1 tablet by mouth daily. 09/25/22   [provider]  methocarbamol (ROBAXIN) 750 MG tablet Take 325 mg by mouth every 8 (eight) hours. Takes 0.5 tablet 05/15/15   [provider]  metoprolol succinate (TOPROL-XL) 100 MG 24 hr tablet Take 150 mg by mouth daily. 08/27/22   [provider]  Multiple Vitamins-Minerals (MULTIVITAMINS THER. W/MINERALS) TABS Take 1 tablet  by mouth daily.      [provider]  oxyCODONE-acetaminophen (PERCOCET) 10-325 MG tablet Take 1 tablet by mouth every 4 (four) hours as needed.    [provider]  oxymetazoline (AFRIN) 0.05 % nasal spray Place 1 spray into both nostrils at bedtime as needed for congestion.    [provider]  sildenafil (VIAGRA) 100 MG tablet Take 100 mg by mouth as needed for erectile dysfunction.    [provider]      Allergies    Patient has no known allergies.    Review of Systems   Review of Systems  Gastrointestinal:  Positive for vomiting.  All other systems reviewed and are negative.   Physical Exam Updated Vital Signs BP (!) 158/81   Pulse 84   Temp 98.1 F (36.7 C) (Oral)   Resp 17   Ht 5\' 11"  (1.803 m)   Wt (!) 143.3 kg   SpO2 94%   BMI 44.07 kg/m  Physical Exam Vitals and nursing note reviewed.  Constitutional:      General: He is not in acute distress.    Appearance: He is well-developed.  HENT:     Head: Normocephalic and atraumatic.  Eyes:     Conjunctiva/sclera: Conjunctivae normal.  Cardiovascular:     Rate and Rhythm: Normal rate and regular rhythm.     Heart sounds: No murmur heard. Pulmonary:     Effort: Pulmonary effort is normal. No respiratory distress.     Breath sounds: Normal breath sounds. No wheezing, rhonchi or rales.  Abdominal:     General: There is distension.     Palpations: Abdomen is soft.     Tenderness: There is no abdominal tenderness. There is no right CVA tenderness or left CVA tenderness.     Comments: Distended firm abdomen  Musculoskeletal:        General: No swelling.     Cervical back: Neck supple.     Right lower leg: No edema.     Left lower leg: Edema present.     Comments: Patient with 1-2+ pitting edema left lower extremity which he states is baseline from prior injury  Skin:    General: Skin is warm and dry.     Capillary Refill: Capillary refill takes less than 2 seconds.  Neurological:      Mental Status: He is alert.  Psychiatric:        Mood and Affect: Mood normal.     ED Results / Procedures / Treatments   Labs (all labs ordered are listed, but only abnormal results are displayed) Labs Reviewed  COMPREHENSIVE METABOLIC PANEL - Abnormal; Notable for the following components:      Result Value   Sodium 127 (*)    Potassium 3.3 (*)    Chloride 86 (*)    Glucose, Bld 107 (*)    Calcium 8.6 (*)    Albumin 3.4 (*)    All other components within normal limits  CBC WITH DIFFERENTIAL/PLATELET - Abnormal; Notable for  the following components:   Monocytes Absolute 1.3 (*)    All other components within normal limits  URINALYSIS, ROUTINE W REFLEX MICROSCOPIC - Abnormal; Notable for the following components:   Specific Gravity, Urine 1.034 (*)    Ketones, ur 80 (*)    All other components within normal limits  RAPID URINE DRUG SCREEN, HOSP PERFORMED - Abnormal; Notable for the following components:   Opiates POSITIVE (*)    All other components within normal limits  LIPASE, BLOOD  MAGNESIUM  LACTIC ACID, PLASMA    EKG None  Radiology CT ABDOMEN PELVIS W CONTRAST  Result Date: 10/07/2022 CLINICAL DATA:  Abdominal pain, acute, nonlocalized EXAM: CT ABDOMEN AND PELVIS WITH CONTRAST TECHNIQUE: Multidetector CT imaging of the abdomen and pelvis was performed using the standard protocol following bolus administration of intravenous contrast. RADIATION DOSE REDUCTION: This exam was performed according to the departmental dose-optimization program which includes automated exposure control, adjustment of the mA and/or kV according to patient size and/or use of iterative reconstruction technique. CONTRAST:  OMNIPAQUE IOHEXOL 300 MG/ML  SOLN COMPARISON:  CT pelvis 05/05/2014 FINDINGS: Lower chest: No acute abnormality. Hepatobiliary: No focal liver abnormality is seen. No gallstones, gallbladder wall thickening, or biliary dilatation. Pancreas: Unremarkable. No pancreatic  ductal dilatation or surrounding inflammatory changes. Spleen: Normal in size without focal abnormality. Adrenals/Urinary Tract: Unremarkable adrenal glands. There is a exophytic 3.9 cm indeterminate lesion arising from the anterior aspect of the upper pole of the right kidney with internal density of 39 HU. Additional 1.6 cm right renal cyst. There are several other subcentimeter low-density lesions within both kidneys, which are too small to characterize. No renal stone or hydronephrosis. Urinary bladder is largely obscured by metal artifact related to patient's hip prostheses. Stomach/Bowel: Stomach is moderately distended with fluid. Multiple mildly dilated loops of small bowel within the abdomen measuring up to 5.1 cm in diameter. Tapered area of transition to nondilated small bowel within the mid abdomen (series 2, image 58). No abrupt transition point. Normal appendix in the right lower quadrant. Scattered colonic diverticulosis. No focal colonic wall thickening or inflammatory changes. Vascular/Lymphatic: Scattered aortoiliac atherosclerotic calcifications without aneurysm. No abdominopelvic lymphadenopathy. Reproductive: Obscured by streak artifact within the pelvis. Other: Trace perihepatic free fluid. No abdominopelvic fluid collection. No pneumoperitoneum. No abdominal wall hernia. Musculoskeletal: Bilateral hip prostheses. Prior L4-5 spinal fusion. Advanced multilevel degenerative disc disease of the lumbar spine. No acute bony findings. IMPRESSION: 1. Multiple mildly dilated loops of small bowel within the abdomen measuring up to 5.1 cm in diameter. Tapered area of transition to nondilated small bowel within the mid abdomen. No abrupt transition point. Findings are most compatible with a low-grade or partial small bowel obstruction. 2. Indeterminate 3.9 cm exophytic lesion arising from the anterior aspect of the upper pole of the right kidney. Further evaluation with nonemergent renal ultrasound is  recommended. 3. Colonic diverticulosis without evidence of acute diverticulitis. 4. Trace perihepatic free fluid, likely reactive. 5. Aortic atherosclerosis (ICD10-I70.0). Electronically Signed   By: Duanne Guess D.O.   On: 10/07/2022 11:53    Procedures .Critical Care  Performed by: Peter Garter, PA Authorized by: Peter Garter, PA   Critical care provider statement:    Critical care time (minutes):  33   Critical care was necessary to treat or prevent imminent or life-threatening deterioration of the following conditions: SBO.   Critical care was time spent personally by me on the following activities:  Development of treatment plan with patient or  surrogate, discussions with consultants, evaluation of patient's response to treatment, examination of patient, ordering and review of laboratory studies, ordering and review of radiographic studies, ordering and performing treatments and interventions, pulse oximetry, re-evaluation of patient's condition and review of old charts   I assumed direction of critical care for this patient from another provider in my specialty: no     Care discussed with: admitting provider       Medications Ordered in ED Medications  ondansetron (ZOFRAN) injection 4 mg (has no administration in time range)  morphine (PF) 2 MG/ML injection 2 mg (has no administration in time range)  sodium chloride 0.9 % bolus 1,000 mL (0 mLs Intravenous Stopped 10/07/22 1233)  ondansetron (ZOFRAN) injection 4 mg (4 mg Intravenous Given 10/07/22 1036)  morphine (PF) 4 MG/ML injection 4 mg (4 mg Intravenous Given 10/07/22 1038)  iohexol (OMNIPAQUE) 300 MG/ML solution 100 mL (100 mLs Intravenous Contrast Given 10/07/22 1126)  promethazine (PHENERGAN) 12.5 mg in sodium chloride 0.9 % 50 mL IVPB (0 mg Intravenous Stopped 10/07/22 1338)  HYDROmorphone (DILAUDID) injection 1 mg (1 mg Intravenous Given 10/07/22 1308)    ED Course/ Medical Decision Making/ A&P Clinical Course as  of 10/07/22 1344  Sat Oct 07, 2022  1231 Consulted Dr. Magnus Ivan of general surgery who recommended beginning of NG tube and admission to hospital medicine. [CR]  1311 Consulted hospitalist regarding the patient who agreed with admission and assume further treatment/care. [CR]    Clinical Course User Index [CR] Peter Garter, PA                             Medical Decision Making Amount and/or Complexity of Data Reviewed Labs: ordered. Radiology: ordered.  Risk Prescription drug management. Decision regarding hospitalization.   This patient presents to the ED for concern of abdominal pain, nausea, vomiting, this involves an extensive number of treatment options, and is a complaint that carries with it a high risk of complications and morbidity.  The differential diagnosis includes diverticulitis, appendicitis, SBO/LBO, gastritis, PUD, pancreatitis, CBD pathology, hepatitis, cholecystitis, pyelonephritis, nephrolithiasis, cystitis   Co morbidities that complicate the patient evaluation  See HPI   Additional history obtained:  Additional history obtained from EMR External records from outside source obtained and reviewed including hospital records   Lab Tests:  I Ordered, and personally interpreted labs.  The pertinent results include: No leukocytosis noted.  No evidence of anemia.  Placed within range.  Hyponatremia, hypokalemia and hypochloremia of 127 x 3.3, 86 respectively.  No transaminitis.  No renal dysfunction.  Initial lactic acid of 1.1.  Lipase within norm limits.  UA significant for 80 ketones but otherwise unremarkable.  UDS positive for opiates.   Imaging Studies ordered:  I ordered imaging studies including CT abdomen pelvis I independently visualized and interpreted imaging which showed multiple mildly dilated loops of small bowel measuring up to 5.1 cm with transition to nondilated small bowel and mid abdomen.  2.9 cm exophytic lesion anterior aspect of  lower pole of right kidney.  Colonic diverticulosis.  Trace perihepatic free fluid.  Aortic atherosclerosis. I agree with the radiologist interpretation  Cardiac Monitoring: / EKG:  The patient was maintained on a cardiac monitor.  I personally viewed and interpreted the cardiac monitored which showed an underlying rhythm of: Sinus arrhythmia with presence of PVC   Consultations Obtained:  See ED course  Problem List / ED Course / Critical interventions / Medication management  Small bowel obstruction partial I ordered medication including morphine, Zofran, 1 L normal saline   Reevaluation of the patient after these medicines showed that the patient improved I have reviewed the patients home medicines and have made adjustments as needed   Social Determinants of Health:  Denies tobacco, illicit drug use.   Test / Admission - Considered:  Partial small bowel obstruction Vitals signs significant for mild hypertension with blood pressure 158/81. Otherwise within normal range and stable throughout visit. Laboratory/imaging studies significant for: See above 67 year old male presents emergency [facility] with complaints of nausea, vomiting, abdominal distention and intermittent abdominal pain since Tuesday of this week.  Patient found with evidence of partial small bowel obstruction on CT imaging is most likely cause of patient's symptoms..  Patient without meeting of SIRS criteria with normal lactic, no leukocytosis.  NG tube was placed while in the ED by nursing staff and patient kept NPO.  Consulted general surgery as well as hospitalist regarding admission. Treatment plan discussed at length with patient and he acknowledged understanding was agreeable to said plan.  Patient stable upon mission the hospital.        Final Clinical Impression(s) / ED Diagnoses Final diagnoses:  Partial small bowel obstruction (HCC)  Nausea and vomiting, unspecified vomiting type    Rx / DC  Orders ED Discharge Orders     None         Peter Garter, [place] 10/07/22 1344    Pricilla Loveless, MD 10/07/22 1443

## 2022-10-07 NOTE — H&P (Addendum)
History and Physical    Patient: [name]:[id number] DOB: [birth year] DOA: 10/07/2022 DOS: the patient was seen and examined on 10/07/2022 PCP: [name], MD  Patient coming from: Home  Chief Complaint:  Chief Complaint  Patient presents with   Emesis   HPI: [name] is a 67 y.o. male with medical history significant of hypertension, hyperlipidemia, obstructive sleep apnea, chronic lower back pain and chronic neck pain s/p decompressive cervical laminectomy and cervical fusion in 2019. [name] presented to [facility] with abdominal pain with nausea, emesis, and abdominal bloating. He reports this has been ongoing since Tuesday. He reports his last bowel movement was Wednesday. He has been passing flatus. He has been unable to tolerate PO intake since Tuesday. He has no prior history of bowel obstructions and has had no prior abdominal surgery. He was recently seen in the emergency department with a urinary tract infection, culture(+) for E. Coli and is being treated with antibiotics.     Review of Systems: As mentioned in the history of present illness. All other systems reviewed and are negative. Past Medical History:  Diagnosis Date   Arthritis    back   Chronic lower back pain    Hyperlipemia    Hypertension    OSA on CPAP    settings 14   Right hydrocele    Wears contact lenses    Past Surgical History:  Procedure Laterality Date   ANTERIOR CERVICAL DECOMP/DISCECTOMY FUSION  1999   CARDIOVASCULAR STRESS TEST  06-23-2010   normal lexiscan nuclear study/  normal LV function and wall motion, ef 60%   CARPAL TUNNEL RELEASE Left 05/02/2017   Procedure: LEFT CARPAL TUNNEL RELEASE;  Surgeon: Tia Alert, MD;  Location: [facility] OR;  Service: Neurosurgery;  Laterality: Left;   COLONOSCOPY     HYDROCELE EXCISION Right 08/25/2015   Procedure: RIGHT HYDROCELE REPAIR;  Surgeon: Barron Alvine, MD;  Location: [facility];  Service: Urology;  Laterality: Right;    KNEE ARTHROSCOPY Bilateral    KNEE ARTHROSCOPY W/ SYNOVECTOMY Left 01-29-2007  &  re-do 02-05-2007   MAXIMUM ACCESS (MAS)POSTERIOR LUMBAR INTERBODY FUSION (PLIF) 2 LEVEL N/A 06/17/2015   Procedure: LUMBAR FOUR-LUMBAR FIVE MAXIMUM ACCESS (MAS) POSTERIOR LUMBAR INTERBODY FUSION (PLIF) WITH LUMBAR ONE-TWO, LUMBAR FOUR-FIVE LAMINECTOMY ;  Surgeon: Tia Alert, MD;  Location: [facility];  Service: Neurosurgery;  Laterality: N/A;   POSTERIOR CERVICAL FUSION/FORAMINOTOMY N/A 05/02/2017   Procedure: Cervical three-six Posterior cervical fusion with lateral mass fixation, Cervical three-four laminectomy;  Surgeon: Tia Alert, MD;  Location: [facility] OR;  Service: Neurosurgery;  Laterality: N/A;   SCROTAL EXPLORATION N/A 08/25/2015   Procedure: SCROTUM EXPLORATION;  Surgeon: Barron Alvine, MD;  Location: [facility];  Service: Urology;  Laterality: N/A;   SPERMATOCELECTOMY N/A 08/25/2015   Procedure: SPERMATOCELE EXCISION;  Surgeon: Barron Alvine, MD;  Location: [facility];  Service: Urology;  Laterality: N/A;   TONSILLECTOMY  as child   TOTAL HIP ARTHROPLASTY  05/01/2011   Procedure: TOTAL HIP ARTHROPLASTY;  Surgeon: Nestor Lewandowsky;  Location: [facility] OR;  Service: Orthopedics;  Laterality: Left;  DEPUY   TOTAL HIP ARTHROPLASTY Right 1995   TOTAL KNEE ARTHROPLASTY Bilateral right 04-13-2008/  left 03-18-2007   TRANSTHORACIC ECHOCARDIOGRAM  11-20-2006   mild LVH, 55-60%/  mild AV sclerosis without stenosis/  trace Steven   Social History:  reports that he has never smoked. He has never used smokeless tobacco. He reports that he  does not drink alcohol and does not use drugs.  No Known Allergies  Family History  Problem Relation Age of Onset   Heart failure Mother    Heart disease Father    Diabetes Father     Prior to Admission medications   Medication Sig Start Date End Date Taking? Authorizing Provider  ALPRAZolam Prudy Feeler) 0.5 MG tablet Take 1 tablet (0.5 mg total) by mouth as  needed for anxiety (for sedation before MRI scan; take 1 hour before scan; may repeat 15 min before scan). 12/20/17   Penumalli, Glenford Bayley, MD  amLODipine (NORVASC) 10 MG tablet Take 10 mg by mouth daily. 09/20/22   [provider]  aspirin 325 MG EC tablet Take 325 mg by mouth daily.    [provider]  atorvastatin (LIPITOR) 40 MG tablet Take 40 mg by mouth every morning.     [provider]  celecoxib (CELEBREX) 200 MG capsule Take 400 mg by mouth daily. Takes 2 tablets    [provider]  cephALEXin (KEFLEX) 500 MG capsule Take 1 capsule (500 mg total) by mouth 4 (four) times daily for 7 days. 10/05/22 10/12/22  Blue, Soijett A, PA-C  Cholecalciferol 2000 units CAPS Take 4,000 Units by mouth daily. Takes 2 tablets    [provider]  Coenzyme Q10 (COQ10) 100 MG CAPS Take 200 mg by mouth daily. Takes 2 tablets    [provider]  diphenhydramine-acetaminophen (TYLENOL PM) 25-500 MG TABS Take 2 tablets by mouth at bedtime.     [provider]  docusate sodium (COLACE) 100 MG capsule Take 300 mg by mouth daily. Takes 3 tablets    [provider]  losartan (COZAAR) 100 MG tablet Take 100 mg by mouth daily.    [provider]  losartan-hydrochlorothiazide (HYZAAR) 100-25 MG tablet Take 1 tablet by mouth daily. 09/25/22   [provider]  methocarbamol (ROBAXIN) 750 MG tablet Take 325 mg by mouth every 8 (eight) hours. Takes 0.5 tablet 05/15/15   [provider]  metoprolol succinate (TOPROL-XL) 100 MG 24 hr tablet Take 150 mg by mouth daily. 08/27/22   [provider]  Multiple Vitamins-Minerals (MULTIVITAMINS THER. W/MINERALS) TABS Take 1 tablet by mouth daily.      [provider]  ondansetron (ZOFRAN) 4 MG tablet Take 1 tablet (4 mg total) by mouth every 8 (eight) hours as needed for nausea or vomiting. 10/05/22   Blue, Soijett A, PA-C  oxyCODONE-acetaminophen (PERCOCET) 10-325 MG tablet Take 1  tablet by mouth every 4 (four) hours as needed.    [provider]  oxymetazoline (AFRIN) 0.05 % nasal spray Place 1 spray into both nostrils at bedtime as needed for congestion.    [provider]  sildenafil (VIAGRA) 100 MG tablet Take 100 mg by mouth as needed for erectile dysfunction.    [provider]    Physical Exam: Vitals:   10/07/22 0957 10/07/22 1009 10/07/22 1145 10/07/22 1200  BP: (!) 147/76  (!) 182/83 (!) 158/81  Pulse: 88  88 84  Resp: 18  18 17   Temp: 98.1 F (36.7 C)     TempSrc: Oral     SpO2: 95%  97% 94%  Weight:  (!) 316 lb (143.3 kg)    Height:  5\' 11"  (1.803 m)     Constitutional: NAD, calm, comfortable Eyes: PERRL, lids and conjunctivae normal ENMT: Mucous membranes are moist. Posterior pharynx clear of any exudate or lesions.  Neck: normal, supple, no  masses, no thyromegaly Respiratory: clear to auscultation bilaterally, no wheezing, no crackles. Normal respiratory effort. No accessory muscle use.  Cardiovascular: Regular rate and rhythm, no murmurs / rubs / gallops. +1 (L) lower extremity edema noted (patient reports this is chronic and related to ankle deformity) 2+ radial and pedal pulses. No carotid bruits.  Abdomen: abdomen is obese and distended, no tenderness on palpation, (+) umbilical hernia palpated. No hepatosplenomegaly. Bowel sounds hyperactive.  Musculoskeletal: no clubbing / cyanosis. (L) ankle deformity. Bilateral shoulders and (L) ankle with limited ROM, otherwise good ROM, no contractures. Normal muscle tone.  Skin: no rashes, lesions, ulcers. No induration Neurologic: CN 2-12 grossly intact. Sensation intact. Strength 5/5 x all 4 extremities.  Psychiatric: Normal judgment and insight. Alert and oriented x 3. Normal mood.   Data Reviewed: CBC    Component Value Date/Time   WBC 6.0 10/07/2022 1032   RBC 4.84 10/07/2022 1032   HGB 13.8 10/07/2022 1032   HCT 41.4 10/07/2022 1032   PLT 332 10/07/2022 1032   MCV  85.5 10/07/2022 1032   MCH 28.5 10/07/2022 1032   MCHC 33.3 10/07/2022 1032   RDW 13.0 10/07/2022 1032   LYMPHSABS 1.0 10/07/2022 1032   MONOABS 1.3 (H) 10/07/2022 1032   EOSABS 0.0 10/07/2022 1032   BASOSABS 0.0 10/07/2022 1032    CMP     Component Value Date/Time   NA 127 (L) 10/07/2022 1032   K 3.3 (L) 10/07/2022 1032   CL 86 (L) 10/07/2022 1032   CO2 29 10/07/2022 1032   GLUCOSE 107 (H) 10/07/2022 1032   BUN 15 10/07/2022 1032   CREATININE 0.77 10/07/2022 1032   CALCIUM 8.6 (L) 10/07/2022 1032   PROT 7.1 10/07/2022 1032   ALBUMIN 3.4 (L) 10/07/2022 1032   AST 19 10/07/2022 1032   ALT 37 10/07/2022 1032   ALKPHOS 56 10/07/2022 1032   BILITOT 0.9 10/07/2022 1032   GFRNONAA >60 10/07/2022 1032   Magnesium    Component Value Date/Time   MAGNESIUM 1.9 10/07/2022 1032    Lactic Acid, Venous    Component Value Date/Time   LATICACIDVEN 1.1 10/07/2022 1234   Lipase     Component Value Date/Time   LIPASE 45 10/07/2022 1032    Results for orders placed or performed during the hospital encounter of 10/05/22  Urine Culture     Status: Abnormal   Collection Time: 10/05/22 11:24 AM   Specimen: Urine, Clean Catch  Result Value Ref Range Status   Specimen Description   Final    URINE, CLEAN CATCH Performed at [facility], [address] [facility] [address] [place], [place] [address]    Special Requests   Final    NONE Performed at [facility], [address] [place], [place] [address]    Culture >=100,000 COLONIES/mL ESCHERICHIA COLI (A)  Final   Report Status 10/07/2022 FINAL  Final   Organism ID, Bacteria ESCHERICHIA COLI (A)  Final      Susceptibility   Escherichia coli - MIC*    AMPICILLIN 4 SENSITIVE Sensitive     CEFAZOLIN <=4 SENSITIVE Sensitive     CEFEPIME <=0.12 SENSITIVE Sensitive     CEFTRIAXONE <=0.25 SENSITIVE Sensitive     CIPROFLOXACIN <=0.25 SENSITIVE Sensitive     GENTAMICIN <=1 SENSITIVE Sensitive     IMIPENEM <=0.25 SENSITIVE  Sensitive     NITROFURANTOIN <=16 SENSITIVE Sensitive     TRIMETH/SULFA <=20 SENSITIVE Sensitive     AMPICILLIN/SULBACTAM <=2 SENSITIVE Sensitive  PIP/TAZO <=4 SENSITIVE Sensitive     * >=100,000 COLONIES/mL ESCHERICHIA COLI   Drugs of Abuse     Component Value Date/Time   LABOPIA POSITIVE (A) 10/07/2022 1234   COCAINSCRNUR NONE DETECTED 10/07/2022 1234   LABBENZ NONE DETECTED 10/07/2022 1234   AMPHETMU NONE DETECTED 10/07/2022 1234   THCU NONE DETECTED 10/07/2022 1234   LABBARB NONE DETECTED 10/07/2022 1234      CT ABDOMEN PELVIS W CONTRAST  Result Date: 10/07/2022 CLINICAL DATA:  Abdominal pain, acute, nonlocalized EXAM: CT ABDOMEN AND PELVIS WITH CONTRAST TECHNIQUE: Multidetector CT imaging of the abdomen and pelvis was performed using the standard protocol following bolus administration of intravenous contrast. RADIATION DOSE REDUCTION: This exam was performed according to the departmental dose-optimization program which includes automated exposure control, adjustment of the mA and/or kV according to patient size and/or use of iterative reconstruction technique. CONTRAST:  OMNIPAQUE IOHEXOL 300 MG/ML  SOLN COMPARISON:  CT pelvis 05/05/2014 FINDINGS: Lower chest: No acute abnormality. Hepatobiliary: No focal liver abnormality is seen. No gallstones, gallbladder wall thickening, or biliary dilatation. Pancreas: Unremarkable. No pancreatic ductal dilatation or surrounding inflammatory changes. Spleen: Normal in size without focal abnormality. Adrenals/Urinary Tract: Unremarkable adrenal glands. There is a exophytic 3.9 cm indeterminate lesion arising from the anterior aspect of the upper pole of the right kidney with internal density of 39 HU. Additional 1.6 cm right renal cyst. There are several other subcentimeter low-density lesions within both kidneys, which are too small to characterize. No renal stone or hydronephrosis. Urinary bladder is largely obscured by metal artifact  related to patient's hip prostheses. Stomach/Bowel: Stomach is moderately distended with fluid. Multiple mildly dilated loops of small bowel within the abdomen measuring up to 5.1 cm in diameter. Tapered area of transition to nondilated small bowel within the mid abdomen (series 2, image 58). No abrupt transition point. Normal appendix in the right lower quadrant. Scattered colonic diverticulosis. No focal colonic wall thickening or inflammatory changes. Vascular/Lymphatic: Scattered aortoiliac atherosclerotic calcifications without aneurysm. No abdominopelvic lymphadenopathy. Reproductive: Obscured by streak artifact within the pelvis. Other: Trace perihepatic free fluid. No abdominopelvic fluid collection. No pneumoperitoneum. No abdominal wall hernia. Musculoskeletal: Bilateral hip prostheses. Prior L4-5 spinal fusion. Advanced multilevel degenerative disc disease of the lumbar spine. No acute bony findings. IMPRESSION: 1. Multiple mildly dilated loops of small bowel within the abdomen measuring up to 5.1 cm in diameter. Tapered area of transition to nondilated small bowel within the mid abdomen. No abrupt transition point. Findings are most compatible with a low-grade or partial small bowel obstruction. 2. Indeterminate 3.9 cm exophytic lesion arising from the anterior aspect of the upper pole of the right kidney. Further evaluation with nonemergent renal ultrasound is recommended. 3. Colonic diverticulosis without evidence of acute diverticulitis. 4. Trace perihepatic free fluid, likely reactive. 5. Aortic atherosclerosis (ICD10-I70.0). Electronically Signed   By: Duanne Guess D.O.   On: 10/07/2022 11:53     Assessment and Plan: #Partial Small Bowel Obstruction CT Abdomen suggestive of low-grade or partial small bowel obstruction. He does not have a history of small bowel obstructions. He does report both Hiatal and Umbilical hernias. He denies a history of abdominal surgeries. Patient does not  currently have signs/symptoms of bowel ischemia or peritonitis. Lactic acid 1.1 WBC 6.0.   - Keep NPO (small amount of ice chips for patient comfort OK)  - NGT to LIWS - MIVF at 75 mL/hr - Daily BMP + Mg - PRN electrolyte correction for goal K >4 and Mg >  2 - Analgesia as needed - PRN antiemetics - General surgery consulted, appreciate their recommendations. Anticipate supportive care/medical management.  # E. Coli Urinary Tract Infection  - Unasyn  #HTN - Scheduled IV Metoprolol - PRN IV Hydralazine  # Hyponatremia Na 127 in setting of no PO intake since Tuesday. - NS at 100 mL/hr   #Hypokalemia K 3.3 on AM CMP - Replete PRN  #(R) Renal Mass - US Renal for further evaluation  # (L) Rotator Cuff Tear Moderate-sized glenohumeral joint effusion and moderate amount of fluid in the subacromial/subdeltoid bursa. Patient has planned outpatient shoulder replacement with Ortho. - Supportive care - Anagelsia as needed - Precautions: do not lift (L) arm above shoulder level - Continue outpatient follow up  #Hyperlipidemia - Resume home meds once tolerating PO  #Obstructive Sleep Apnea - Resume home CPAP when nausea controlled  - Supplemental O2 as needed while sleeping  VTE prophylaxis: Lovenox GI prophylaxis: Protonix Diet: NPO Access: PIV Lines: NONE Telemetry: No Disposition: Admit to Med Surg    Advance Care Planning: Code Status: FULL  Consults: General Surgery  Family Communication: Wife at bedside  Severity of Illness: The appropriate patient status for this patient is INPATIENT. Inpatient status is judged to be reasonable and necessary in order to provide the required intensity of service to ensure the patient's safety. The patient's presenting symptoms, physical exam findings, and initial radiographic and laboratory data in the context of their chronic comorbidities is felt to place them at high risk for further clinical deterioration. Furthermore, it is not  anticipated that the patient will be medically stable for discharge from the hospital within 2 midnights of admission.   * I certify that at the point of admission it is my clinical judgment that the patient will require inpatient hospital care spanning beyond 2 midnights from the point of admission due to high intensity of service, high risk for further deterioration and high frequency of surveillance required.*  To reach the provider On-Call:   7AM- 7PM see care teams to locate the attending and reach out to them via [email or web address] Password: [email or web address] 7PM-7AM contact night-coverage If you still have difficulty reaching the appropriate provider, please page the [facility] (Director on Call) for [facility] on amion for assistance  This document was prepared using Conservation officer, historic buildings and may include unintentional dictation errors.  Bishop Limbo FNP-BC, PMHNP-BC Nurse Practitioner [facility]

## 2022-10-07 NOTE — Plan of Care (Signed)

## 2022-10-07 NOTE — Consult Note (Signed)
Reason for Consult:possible bowel obstruction Referring Physician: Dr.C [name] is an 66 y.o. male.  HPI: This is an 67 year old gentleman who presents to the abdominal apartment with nausea, emesis, and abdominal bloating.  He reports this has been going on for approximately 4 days.  He was recently seen in the emergency department with a urinary tract infection positive for E. Coli and is being treated with antibiotics.  He reports he last moved his bowels on Wednesday but is still passing flatus.  He has no prior history of bowel obstructions and has had no prior abdominal surgery.  Surgery was asked to see the patient after a CT scan suggested a partial small bowel obstruction.  The pain is crampy in nature.  He has had no fevers or chills.  He has had a recent colonoscopy which was unremarkable.  Past Medical History:  Diagnosis Date   Arthritis    back   Chronic lower back pain    Hyperlipemia    Hypertension    OSA on CPAP    settings 14   Right hydrocele    Wears contact lenses     Past Surgical History:  Procedure Laterality Date   ANTERIOR CERVICAL DECOMP/DISCECTOMY FUSION  1999   CARDIOVASCULAR STRESS TEST  06-23-2010   normal lexiscan nuclear study/  normal LV function and wall motion, ef 60%   CARPAL TUNNEL RELEASE Left 05/02/2017   Procedure: LEFT CARPAL TUNNEL RELEASE;  Surgeon: Tia Alert, MD;  Location: [facility] OR;  Service: Neurosurgery;  Laterality: Left;   COLONOSCOPY     HYDROCELE EXCISION Right 08/25/2015   Procedure: RIGHT HYDROCELE REPAIR;  Surgeon: Barron Alvine, MD;  Location: [facility];  Service: Urology;  Laterality: Right;   KNEE ARTHROSCOPY Bilateral    KNEE ARTHROSCOPY W/ SYNOVECTOMY Left 01-29-2007  &  re-do 02-05-2007   MAXIMUM ACCESS (MAS)POSTERIOR LUMBAR INTERBODY FUSION (PLIF) 2 LEVEL N/A 06/17/2015   Procedure: LUMBAR FOUR-LUMBAR FIVE MAXIMUM ACCESS (MAS) POSTERIOR LUMBAR INTERBODY FUSION (PLIF) WITH LUMBAR ONE-TWO,  LUMBAR FOUR-FIVE LAMINECTOMY ;  Surgeon: Tia Alert, MD;  Location: [facility];  Service: Neurosurgery;  Laterality: N/A;   POSTERIOR CERVICAL FUSION/FORAMINOTOMY N/A 05/02/2017   Procedure: Cervical three-six Posterior cervical fusion with lateral mass fixation, Cervical three-four laminectomy;  Surgeon: Tia Alert, MD;  Location: [facility] OR;  Service: Neurosurgery;  Laterality: N/A;   SCROTAL EXPLORATION N/A 08/25/2015   Procedure: SCROTUM EXPLORATION;  Surgeon: Barron Alvine, MD;  Location: [facility];  Service: Urology;  Laterality: N/A;   SPERMATOCELECTOMY N/A 08/25/2015   Procedure: SPERMATOCELE EXCISION;  Surgeon: Barron Alvine, MD;  Location: [facility];  Service: Urology;  Laterality: N/A;   TONSILLECTOMY  as child   TOTAL HIP ARTHROPLASTY  05/01/2011   Procedure: TOTAL HIP ARTHROPLASTY;  Surgeon: Nestor Lewandowsky;  Location: [facility] OR;  Service: Orthopedics;  Laterality: Left;  DEPUY   TOTAL HIP ARTHROPLASTY Right 1995   TOTAL KNEE ARTHROPLASTY Bilateral right 04-13-2008/  left 03-18-2007   TRANSTHORACIC ECHOCARDIOGRAM  11-20-2006   mild LVH, 55-60%/  mild AV sclerosis without stenosis/  trace MR    Family History  Problem Relation Age of Onset   Heart failure Mother    Heart disease Father    Diabetes Father     Social History:  reports that he has never smoked. He has never used smokeless tobacco. He reports that he does not drink alcohol and does not use drugs.  Allergies:  No Known Allergies  Medications: I have reviewed the patient's current medications.  Results for orders placed or performed during the hospital encounter of 10/07/22 (from the past 48 hour(s))  Comprehensive metabolic panel     Status: Abnormal   Collection Time: 10/07/22 10:32 AM  Result Value Ref Range   Sodium 127 (L) 135 - 145 mmol/L   Potassium 3.3 (L) 3.5 - 5.1 mmol/L   Chloride 86 (L) 98 - 111 mmol/L   CO2 29 22 - 32 mmol/L   Glucose, Bld 107 (H) 70 - 99 mg/dL     Comment: Glucose reference range applies only to samples taken after fasting for at least 8 hours.   BUN 15 8 - 23 mg/dL   Creatinine, Ser 9.56 0.61 - 1.24 mg/dL   Calcium 8.6 (L) 8.9 - 10.3 mg/dL   Total Protein 7.1 6.5 - 8.1 g/dL   Albumin 3.4 (L) 3.5 - 5.0 g/dL   AST 19 15 - 41 U/L   ALT 37 0 - 44 U/L   Alkaline Phosphatase 56 38 - 126 U/L   Total Bilirubin 0.9 0.3 - 1.2 mg/dL   GFR, Estimated >21 >30 mL/min    Comment: (NOTE) Calculated using the CKD-EPI Creatinine Equation (2021)    Anion gap 12 5 - 15    Comment: Performed at [facility], [address] [place], [place] [address]  CBC with Differential     Status: Abnormal   Collection Time: 10/07/22 10:32 AM  Result Value Ref Range   WBC 6.0 4.0 - 10.5 K/uL   RBC 4.84 4.22 - 5.81 MIL/uL   Hemoglobin 13.8 13.0 - 17.0 g/dL   HCT 46.9 62.9 - 52.8 %   MCV 85.5 80.0 - 100.0 fL   MCH 28.5 26.0 - 34.0 pg   MCHC 33.3 30.0 - 36.0 g/dL   RDW 41.3 24.4 - 01.0 %   Platelets 332 150 - 400 K/uL   nRBC 0.0 0.0 - 0.2 %   Neutrophils Relative % 60 %   Neutro Abs 3.6 1.7 - 7.7 K/uL   Lymphocytes Relative 17 %   Lymphs Abs 1.0 0.7 - 4.0 K/uL   Monocytes Relative 21 %   Monocytes Absolute 1.3 (H) 0.1 - 1.0 K/uL   Eosinophils Relative 1 %   Eosinophils Absolute 0.0 0.0 - 0.5 K/uL   Basophils Relative 1 %   Basophils Absolute 0.0 0.0 - 0.1 K/uL   Immature Granulocytes 0 %   Abs Immature Granulocytes 0.01 0.00 - 0.07 K/uL    Comment: Performed at [facility], [address] [place], [place] [address]  Lipase, blood     Status: None   Collection Time: 10/07/22 10:32 AM  Result Value Ref Range   Lipase 45 11 - 51 U/L    Comment: Performed at [facility], [address], [place], [place] [address]  Magnesium     Status: None   Collection Time: 10/07/22 10:32 AM  Result Value Ref Range   Magnesium 1.9 1.7 - 2.4 mg/dL    Comment: Performed at [facility]  [address], [place], [place] [address]  Urinalysis, Routine w reflex microscopic -Urine, Clean Catch     Status: Abnormal   Collection Time: 10/07/22 12:34 PM  Result Value Ref Range   Color, Urine YELLOW YELLOW   APPearance CLEAR CLEAR   Specific Gravity, Urine 1.034 (H) 1.005 - 1.030   pH 6.0 5.0 - 8.0  Glucose, UA NEGATIVE NEGATIVE mg/dL   Hgb urine dipstick NEGATIVE NEGATIVE   Bilirubin Urine NEGATIVE NEGATIVE   Ketones, ur 80 (A) NEGATIVE mg/dL   Protein, ur NEGATIVE NEGATIVE mg/dL   Nitrite NEGATIVE NEGATIVE   Leukocytes,Ua NEGATIVE NEGATIVE    Comment: Performed at [facility], [address], [place], [place] [address]  Urine rapid drug screen (hosp performed)     Status: Abnormal   Collection Time: 10/07/22 12:34 PM  Result Value Ref Range   Opiates POSITIVE (A) NONE DETECTED   Cocaine NONE DETECTED NONE DETECTED   Benzodiazepines NONE DETECTED NONE DETECTED   Amphetamines NONE DETECTED NONE DETECTED   Tetrahydrocannabinol NONE DETECTED NONE DETECTED   Barbiturates NONE DETECTED NONE DETECTED    Comment: (NOTE) DRUG SCREEN FOR MEDICAL PURPOSES ONLY.  IF CONFIRMATION IS NEEDED FOR ANY PURPOSE, NOTIFY LAB WITHIN 5 DAYS.  LOWEST DETECTABLE LIMITS FOR URINE DRUG SCREEN Drug Class                     Cutoff (ng/mL) Amphetamine and metabolites    1000 Barbiturate and metabolites    200 Benzodiazepine                 200 Opiates and metabolites        300 Cocaine and metabolites        300 THC                            50 Performed at [facility], [address] [place], [place] [address]   Lactic acid, plasma     Status: None   Collection Time: 10/07/22 12:34 PM  Result Value Ref Range   Lactic Acid, Venous 1.1 0.5 - 1.9 mmol/L    Comment: Performed at [facility], [address], [place], [place] [address]    CT ABDOMEN PELVIS W CONTRAST  Result Date: 10/07/2022 CLINICAL DATA:  Abdominal pain,  acute, nonlocalized EXAM: CT ABDOMEN AND PELVIS WITH CONTRAST TECHNIQUE: Multidetector CT imaging of the abdomen and pelvis was performed using the standard protocol following bolus administration of intravenous contrast. RADIATION DOSE REDUCTION: This exam was performed according to the departmental dose-optimization program which includes automated exposure control, adjustment of the mA and/or kV according to patient size and/or use of iterative reconstruction technique. CONTRAST:  OMNIPAQUE IOHEXOL 300 MG/ML  SOLN COMPARISON:  CT pelvis 05/05/2014 FINDINGS: Lower chest: No acute abnormality. Hepatobiliary: No focal liver abnormality is seen. No gallstones, gallbladder wall thickening, or biliary dilatation. Pancreas: Unremarkable. No pancreatic ductal dilatation or surrounding inflammatory changes. Spleen: Normal in size without focal abnormality. Adrenals/Urinary Tract: Unremarkable adrenal glands. There is a exophytic 3.9 cm indeterminate lesion arising from the anterior aspect of the upper pole of the right kidney with internal density of 39 HU. Additional 1.6 cm right renal cyst. There are several other subcentimeter low-density lesions within both kidneys, which are too small to characterize. No renal stone or hydronephrosis. Urinary bladder is largely obscured by metal artifact related to patient's hip prostheses. Stomach/Bowel: Stomach is moderately distended with fluid. Multiple mildly dilated loops of small bowel within the abdomen measuring up to 5.1 cm in diameter. Tapered area of transition to nondilated small bowel within the mid abdomen (series 2, image 58). No abrupt transition point. Normal appendix in the right lower quadrant. Scattered colonic diverticulosis. No focal colonic wall thickening or inflammatory changes. Vascular/Lymphatic: Scattered aortoiliac  atherosclerotic calcifications without aneurysm. No abdominopelvic lymphadenopathy. Reproductive: Obscured by streak artifact within  the pelvis. Other: Trace perihepatic free fluid. No abdominopelvic fluid collection. No pneumoperitoneum. No abdominal wall hernia. Musculoskeletal: Bilateral hip prostheses. Prior L4-5 spinal fusion. Advanced multilevel degenerative disc disease of the lumbar spine. No acute bony findings. IMPRESSION: 1. Multiple mildly dilated loops of small bowel within the abdomen measuring up to 5.1 cm in diameter. Tapered area of transition to nondilated small bowel within the mid abdomen. No abrupt transition point. Findings are most compatible with a low-grade or partial small bowel obstruction. 2. Indeterminate 3.9 cm exophytic lesion arising from the anterior aspect of the upper pole of the right kidney. Further evaluation with nonemergent renal ultrasound is recommended. 3. Colonic diverticulosis without evidence of acute diverticulitis. 4. Trace perihepatic free fluid, likely reactive. 5. Aortic atherosclerosis (ICD10-I70.0). Electronically Signed   By: Duanne Guess D.O.   On: 10/07/2022 11:53    Review of Systems Blood pressure (!) 158/81, pulse 84, temperature 98.1 F (36.7 C), temperature source Oral, resp. rate 17, height 5\' 11"  (1.803 m), weight (!) 143.3 kg, SpO2 94 %. Physical Exam Constitutional:      General: He is not in acute distress.    Appearance: He is obese.  HENT:     Head: Normocephalic and atraumatic.     Right Ear: External ear normal.     Left Ear: External ear normal.     Nose: Nose normal.  Eyes:     General: No scleral icterus. Cardiovascular:     Rate and Rhythm: Normal rate and regular rhythm.  Abdominal:     Comments: He is morbidly obese.  He has a distended abdomen mostly in the upper abdomen.  There is very minimal tenderness.  He has a very small, easily reducible umbilical hernia with a less than 1 cm fascial defect.  The hernia is nontender.  Musculoskeletal:        General: Normal range of motion.  Skin:    General: Skin is warm and dry.  Neurological:      General: No focal deficit present.     Mental Status: He is alert.  Psychiatric:        Behavior: Behavior normal.        Judgment: Judgment normal.     Assessment/Plan: Partial small bowel obstruction versus ileus  I have reviewed the CT scan of the abdomen pelvis.  Given the distended stomach as well, I suspect this is more than likely an ileus related to medications or his underlying previous urinary tract infection and no prior history of abdominal surgery and not a true bowel obstruction.  He is fairly distended so I agree with nasogastric tube placement.  I will also order the small bowel protocol. The CT scan does show an indeterminate 3.9 cm exophytic mass on the right kidney.  Radiology did recommend an ultrasound of this but since he is being admitted, I would recommend a urology consult as well. We will follow him with you.  Moderately complex medical decision making  Steven Miyamoto MD 10/07/2022, 1:48 PM

## 2022-10-08 ENCOUNTER — Inpatient Hospital Stay (HOSPITAL_COMMUNITY): Payer: Medicare Other

## 2022-10-08 ENCOUNTER — Telehealth (HOSPITAL_BASED_OUTPATIENT_CLINIC_OR_DEPARTMENT_OTHER): Payer: Self-pay | Admitting: *Deleted

## 2022-10-08 DIAGNOSIS — K56600 Partial intestinal obstruction, unspecified as to cause: Secondary | ICD-10-CM

## 2022-10-08 LAB — GLUCOSE, CAPILLARY
Glucose-Capillary: 100 mg/dL — ABNORMAL HIGH (ref 70–99)
Glucose-Capillary: 86 mg/dL (ref 70–99)
Glucose-Capillary: 90 mg/dL (ref 70–99)
Glucose-Capillary: 94 mg/dL (ref 70–99)
Glucose-Capillary: 96 mg/dL (ref 70–99)

## 2022-10-08 LAB — CBC
HCT: 40.7 % (ref 39.0–52.0)
Hemoglobin: 13.3 g/dL (ref 13.0–17.0)
MCH: 28.9 pg (ref 26.0–34.0)
MCHC: 32.7 g/dL (ref 30.0–36.0)
MCV: 88.5 fL (ref 80.0–100.0)
Platelets: 310 10*3/uL (ref 150–400)
RBC: 4.6 MIL/uL (ref 4.22–5.81)
RDW: 13.2 % (ref 11.5–15.5)
WBC: 7.5 10*3/uL (ref 4.0–10.5)
nRBC: 0 % (ref 0.0–0.2)

## 2022-10-08 LAB — BASIC METABOLIC PANEL
Anion gap: 9 (ref 5–15)
BUN: 11 mg/dL (ref 8–23)
CO2: 26 mmol/L (ref 22–32)
Calcium: 7.8 mg/dL — ABNORMAL LOW (ref 8.9–10.3)
Chloride: 97 mmol/L — ABNORMAL LOW (ref 98–111)
Creatinine, Ser: 0.62 mg/dL (ref 0.61–1.24)
GFR, Estimated: >60 mL/min (ref >60)
Glucose, Bld: 102 mg/dL — ABNORMAL HIGH (ref 70–99)
Potassium: 3.3 mmol/L — ABNORMAL LOW (ref 3.5–5.1)
Sodium: 132 mmol/L — ABNORMAL LOW (ref 135–145)

## 2022-10-08 LAB — MAGNESIUM: Magnesium: 2.1 mg/dL (ref 1.7–2.4)

## 2022-10-08 LAB — HIV ANTIBODY (ROUTINE TESTING W REFLEX): HIV Screen 4th Generation wRfx: NONREACTIVE

## 2022-10-08 MED ORDER — METHOCARBAMOL 1000 MG/10ML IJ SOLN
500.0000 mg | Freq: Once | INTRAVENOUS | Status: AC
  Administered 2022-10-08: 500 mg via INTRAVENOUS
  Filled 2022-10-08: qty 500

## 2022-10-08 MED ORDER — LABETALOL HCL 5 MG/ML IV SOLN
10.0000 mg | INTRAVENOUS | Status: DC | PRN
  Administered 2022-10-08 – 2022-10-09 (×2): 10 mg via INTRAVENOUS
  Filled 2022-10-08 (×2): qty 4

## 2022-10-08 MED ORDER — POTASSIUM CHLORIDE 10 MEQ/100ML IV SOLN
10.0000 meq | INTRAVENOUS | Status: AC
  Administered 2022-10-08 (×6): 10 meq via INTRAVENOUS
  Filled 2022-10-08 (×3): qty 100

## 2022-10-08 MED ORDER — METHOCARBAMOL 750 MG PO TABS
375.0000 mg | ORAL_TABLET | Freq: Three times a day (TID) | ORAL | Status: DC
  Filled 2022-10-08 (×2): qty 0.5

## 2022-10-08 MED ORDER — METHOCARBAMOL 1000 MG/10ML IJ SOLN: 325.0000 mg | Freq: Four times a day (QID) | INTRAVENOUS | Status: DC | PRN

## 2022-10-08 NOTE — Progress Notes (Signed)
Subjective/Chief Complaint: Feels weak this morning Denies abdominal pain Passing flatus Had almost 3 liters out of the NG    Objective: Vital signs in last 24 hours: Temp:  [97.5 F (36.4 C)-99.1 F (37.3 C)] 97.9 F (36.6 C) (06/23 0610) Pulse Rate:  [80-98] 81 (06/23 0644) Resp:  [17-21] 20 (06/23 0610) BP: (147-182)/(71-86) 166/85 (06/23 0644) SpO2:  [92 %-97 %] 93 % (06/23 0644) Weight:  [143.3 kg] 143.3 kg (06/22 1009) Last BM Date : 10/04/22  Intake/Output from previous day: 06/22 0701 - 06/23 0700 In: 1135 [I.V.:11.7; NG/GT:30; IV Piggyback:1093.3] Out: 3450 [Urine:550; Emesis/NG output:2900] Intake/Output this shift: Total I/O In: 1108.2 [I.V.:1108.2] Out: -   Exam: Awake and alert Abdomen obese, much softer in the upper abdomen, non-tender  Lab Results:  Recent Labs    10/07/22 1032 10/08/22 0356  WBC 6.0 7.5  HGB 13.8 13.3  HCT 41.4 40.7  PLT 332 310   BMET Recent Labs    10/07/22 1032 10/08/22 0356  NA 127* 132*  K 3.3* 3.3*  CL 86* 97*  CO2 29 26  GLUCOSE 107* 102*  BUN 15 11  CREATININE 0.77 0.62  CALCIUM 8.6* 7.8*   PT/INR No results for input(s): "LABPROT", "INR" in the last 72 hours. ABG No results for input(s): "PHART", "HCO3" in the last 72 hours.  Invalid input(s): "PCO2", "PO2"  Studies/Results: DG Abd Portable 1V-Small Bowel Obstruction Protocol-initial, 8 hr delay  Result Date: 10/08/2022 CLINICAL DATA:  8 hour delay of small bowel obstruction EXAM: PORTABLE ABDOMEN - 1 VIEW COMPARISON:  Yesterday FINDINGS: Enteric tube with tip at the stomach and side port near the GE junction. Enteric contrast is within nondilated colon. There is still a distended loop of small bowel in the left abdomen. No concerning mass effect or calcification.  Lung bases are clear. IMPRESSION: 1. Bowel obstruction is partial with enteric contrast reaching the nondilated colon. Persisting loop of dilated small bowel in the left abdomen. 2. Enteric  tube with tip at the stomach and side port near the GE junction. Electronically Signed   By: Tiburcio Pea M.D.   On: 10/08/2022 07:47   US RENAL  Result Date: 10/07/2022 CLINICAL DATA:  Evaluate renal mass. EXAM: RENAL / URINARY TRACT ULTRASOUND COMPLETE COMPARISON:  CT AP 10/07/22. FINDINGS: Right Kidney: Renal measurements: 13.6 x 6.6 x 7.1 cm = volume: 333.7 mL. Echogenicity within normal limits. No hydronephrosis visualized. Complex cystic structure corresponding to the CT finding is noted arising off the upper pole of left kidney measuring 3 x 2.9 x 2.4 cm. This contains diffuse low-level internal echoes and possible thin internal area of septation. Adjacent simple appearing cyst measures 1.4 x 1.7 x 1.6 cm. Left Kidney: Renal measurements: 13.2 x 6.5 x 7.8 cm = volume: 346.3 mL. Echogenicity within normal limits. No mass or hydronephrosis visualized. Bladder: Appears normal for degree of bladder distention. Other: None. IMPRESSION: 1. No hydronephrosis. 2. Complex cystic structure arising off the upper pole of the left kidney measuring 3 cm. This corresponds to the CT finding and is compatible with a complex renal cyst. This does not require emergent/stat follow-up. When the patient is clinically stable and able to follow directions and hold their breath (preferably as an outpatient) further evaluation with dedicated abdominal MRI should be considered. 3. Simple appearing cyst arising off the upper pole of the right kidney measures 1.7 cm. Electronically Signed   By: Signa Kell M.D.   On: 10/07/2022 15:39   DG Abd  Portable 1 View  Result Date: 10/07/2022 CLINICAL DATA:  Enteric tube placement EXAM: PORTABLE ABDOMEN - 1 VIEW COMPARISON:  CT abdomen and pelvis dated 10/07/2022 FINDINGS: Gastric/enteric tube tip projects over the stomach with side hole projecting over the gastric cardia. Partially imaged lung bases are clear. IMPRESSION: Gastric/enteric tube tip projects over the stomach with side  hole projecting over the gastric cardia. Recommend advancing by 5 cm for more optimal positioning. Electronically Signed   By: Agustin Cree M.D.   On: 10/07/2022 14:31   CT ABDOMEN PELVIS W CONTRAST  Result Date: 10/07/2022 CLINICAL DATA:  Abdominal pain, acute, nonlocalized EXAM: CT ABDOMEN AND PELVIS WITH CONTRAST TECHNIQUE: Multidetector CT imaging of the abdomen and pelvis was performed using the standard protocol following bolus administration of intravenous contrast. RADIATION DOSE REDUCTION: This exam was performed according to the departmental dose-optimization program which includes automated exposure control, adjustment of the mA and/or kV according to patient size and/or use of iterative reconstruction technique. CONTRAST:  OMNIPAQUE IOHEXOL 300 MG/ML  SOLN COMPARISON:  CT pelvis 05/05/2014 FINDINGS: Lower chest: No acute abnormality. Hepatobiliary: No focal liver abnormality is seen. No gallstones, gallbladder wall thickening, or biliary dilatation. Pancreas: Unremarkable. No pancreatic ductal dilatation or surrounding inflammatory changes. Spleen: Normal in size without focal abnormality. Adrenals/Urinary Tract: Unremarkable adrenal glands. There is a exophytic 3.9 cm indeterminate lesion arising from the anterior aspect of the upper pole of the right kidney with internal density of 39 HU. Additional 1.6 cm right renal cyst. There are several other subcentimeter low-density lesions within both kidneys, which are too small to characterize. No renal stone or hydronephrosis. Urinary bladder is largely obscured by metal artifact related to patient's hip prostheses. Stomach/Bowel: Stomach is moderately distended with fluid. Multiple mildly dilated loops of small bowel within the abdomen measuring up to 5.1 cm in diameter. Tapered area of transition to nondilated small bowel within the mid abdomen (series 2, image 58). No abrupt transition point. Normal appendix in the right lower quadrant. Scattered  colonic diverticulosis. No focal colonic wall thickening or inflammatory changes. Vascular/Lymphatic: Scattered aortoiliac atherosclerotic calcifications without aneurysm. No abdominopelvic lymphadenopathy. Reproductive: Obscured by streak artifact within the pelvis. Other: Trace perihepatic free fluid. No abdominopelvic fluid collection. No pneumoperitoneum. No abdominal wall hernia. Musculoskeletal: Bilateral hip prostheses. Prior L4-5 spinal fusion. Advanced multilevel degenerative disc disease of the lumbar spine. No acute bony findings. IMPRESSION: 1. Multiple mildly dilated loops of small bowel within the abdomen measuring up to 5.1 cm in diameter. Tapered area of transition to nondilated small bowel within the mid abdomen. No abrupt transition point. Findings are most compatible with a low-grade or partial small bowel obstruction. 2. Indeterminate 3.9 cm exophytic lesion arising from the anterior aspect of the upper pole of the right kidney. Further evaluation with nonemergent renal ultrasound is recommended. 3. Colonic diverticulosis without evidence of acute diverticulitis. 4. Trace perihepatic free fluid, likely reactive. 5. Aortic atherosclerosis (ICD10-I70.0). Electronically Signed   By: Duanne Guess D.O.   On: 10/07/2022 11:53    Anti-infectives: Anti-infectives (From admission, onward)    Start     Dose/Rate Route Frequency Ordered Stop   10/07/22 1500  Ampicillin-Sulbactam (UNASYN) 3 g in sodium chloride 0.9 % 100 mL IVPB        3 g 200 mL/hr over 30 Minutes Intravenous Every 6 hours 10/07/22 1433     10/07/22 1100  cefTRIAXone (ROCEPHIN) 1 g in sodium chloride 0.9 % 100 mL IVPB  Status:  Discontinued  1 g 200 mL/hr over 30 Minutes Intravenous  Once 10/07/22 1053 10/07/22 1054       Assessment/Plan: pSBO vs ileus  Xray this morning shows contrast in the colon.  I still suspect this was an ileus given his overall history. Given high NG output, will leave until  tomorrow Continue IV rehydration   LOS: 1 day    Abigail Miyamoto MD 10/08/2022

## 2022-10-08 NOTE — Telephone Encounter (Signed)
Post ED Visit - Positive Culture Follow-up  Culture report reviewed by antimicrobial stewardship pharmacist: [facility] []  Enzo Bi, Pharm.D. []  Celedonio Miyamoto, Pharm.D., BCPS AQ-ID []  Garvin Fila, Pharm.D., BCPS []  Georgina Pillion, Pharm.D., BCPS []  [place], [place].D., BCPS, AAHIVP []  Estella Husk, Pharm.D., BCPS, AAHIVP []  Lysle Pearl, PharmD, BCPS []  Phillips Climes, PharmD, BCPS []  Agapito Games, PharmD, BCPS []  Verlan Friends, PharmD []  Mervyn Gay, PharmD, BCPS [x]  Riccardo Dubin, PharmD  [facility] []  Len Childs, PharmD []  Greer Pickerel, PharmD []  Adalberto Cole, PharmD []  Perlie Gold, Rph []  Lonell Face) Jean Rosenthal, PharmD []  Earl Many, PharmD []  Junita Push, PharmD []  Dorna Leitz, PharmD []  Terrilee Files, PharmD []  Lynann Beaver, PharmD []  Keturah Barre, PharmD []  Loralee Pacas, PharmD []  Bernadene Person, PharmD   Positive urine culture Treated with Cephalexin, organism sensitive to the same and no further patient follow-up is required at this time.  Patsey Berthold 10/08/2022, 12:13 PM

## 2022-10-08 NOTE — Progress Notes (Signed)
PROGRESS NOTE    [name]  DGU:[id number] DOB: 06-18-1955 DOA: 10/07/2022 PCP: [name], MD    Brief Narrative:   [name] is a 67 y.o. male with past medical history significant for HTN, HLD, OSA, chronic low back pain, chronic neck pain s/p decompressive cervical laminectomy with fusion 2019 who presented to [facility] ED on 6/22 with nausea, vomiting, abdominal pain.  Ongoing since Tuesday with last bowel movement Wednesday.  Reports passing flatus but unable to tolerate p.o. intake since Tuesday.  No previous history of bowel obstructions with no prior abdominal surgery past.  Recently seen in the ED with a urinary tract infection with culture positive for E. coli and currently on antibiotics.  In the ED, temperature 98.1 F, HR 88, RR 18, BP 147/76, SpO2 95% on room air.  WBC 6.0, hemoglobin 13.8, platelets 332.  Sodium 127, potassium 3.3, chloride 86, CO2 29, glucose 107, BUN 15, creatinine 0.77.  Lipase 45.  AST 19, ALT 37, total bilirubin 0.9.  Lactic acid 1.1.  Urinalysis unrevealing.  UDS positive for opiates.  CT Abdo/pelvis with multiple dilated loops of small bowel within the abdomen measuring up to 5.1 cm with transition in the mid abdomen consistent with partial small bowel obstruction, indeterminate 3.9 cm exophytic lesion anterior aspect upper pole right kidney colonic diverticulosis without acute diverticulitis.  General surgery was consulted.  NG tube was placed to wall suction.  TRH consulted for admission for further evaluation management of small bowel obstruction.  Assessment & Plan:   Partial small bowel obstruction versus ileus Patient presenting with abdominal pain associate with nausea and vomiting and inability to tolerate oral intake.  Patient was afebrile without leukocytosis.  Urinalysis unrevealing.  CT abdomen/pelvis with findings consistent with small bowel obstruction with transition point in the mid abdomen. -- General surgery  following, appreciate assistance -- Continue NGT to LWIS; out past 24h -- NPO, NS at 184mL/h -- Per general surgery  Left renal complex cyst Incidental finding of 3.9 cm exophytic lesion anterior aspect upper pole right kidney.  Renal ultrasound with complex cystic structure upper pole left kidney measuring 3 cm compatible with a complex renal cyst, simple appearing cyst upper pole right kidney 1.7 cm. -- Outpatient abdominal MRI  Hyponatremia Sodium 127 admission, likely secondary to hypovolemic hyponatremia in the setting of dehydration secondary to bowel obstruction with GI loss from nausea/vomiting. -- Na 127>132 -- IVF w/ NS at 135mL/h -- BMP in am  E. coli UTI Recently seen in the ED, diagnosed on 6/20. -- Continue IV Unasyn  Essential hypertension Home regimen includes amlodipine 10 mg p.o. daily, losartan-HCTZ 100-25 mg p.o. daily -- Hold oral hypertensives while NPO -- Metoprolol tartrate 5 mg IV every 6 hours -- Hydralazine 10 mg IV every 4 hours as needed SBP greater than 170  Hyperlipidemia -- Holding home atorvastatin while NPO  OSA -- Oxygen as needed, unable to utilize CPAP given NG tube in place  Morbid obesity Body mass index is 44.07 kg/m.  Discussed with patient needs for aggressive lifestyle changes/weight loss as this complicates all facets of care.  Outpatient follow-up with PCP.    DVT prophylaxis: Lovenox    Code Status: Full Code Family Communication: No family present at bedside  Disposition Plan:  Level of care: Med-Surg Status is: Inpatient Remains inpatient appropriate because: N.p.o., NG tube to suction, needs further advancement of diet with toleration and general surgery signed off before stable for discharge home  Consultants:  General surgery  Procedures:  None  Antimicrobials:  Unasyn 6/22>>   Subjective: Patient seen examined bedside, resting calmly.  Sitting in bedside chair.  Reports abdominal distention/pain  much improved.  Continues with flatus.  Seen by general surgery this morning, will continue NG tube to suction given high output past 24 hours; 2.9 L.  No other specific complaints or concerns at this time.  Denies headache, no dizziness, no chest pain, no palpitations, no shortness of breath, no abdominal pain, no fever/chills/night sweats, no nausea cefonicid diarrhea, no focal weakness, no fatigue, no paresthesias.  No acute events overnight per nursing staff.  Objective: Vitals:   10/08/22 0406 10/08/22 0408 10/08/22 0610 10/08/22 0644  BP:   (!) 181/84 (!) 166/85  Pulse: 83 80 95 81  Resp:   20   Temp:   97.9 F (36.6 C)   TempSrc:   Oral   SpO2: 92% 93% 93% 93%  Weight:      Height:        Intake/Output Summary (Last 24 hours) at 10/08/2022 1344 Last data filed at 10/08/2022 1135 Gross per 24 hour  Intake 1193.22 ml  Output 4550 ml  Net -3356.78 ml   Filed Weights   10/07/22 1009  Weight: (!) 143.3 kg    Examination:  Physical Exam: GEN: NAD, alert and oriented x 3, morbid obesity HEENT: NCAT, PERRL, EOMI, sclera clear, MMM PULM: CTAB w/o wheezes/crackles, normal respiratory effort, on room air CV: RRR w/o M/G/R GI: abd soft, NTND, faint bowel sounds appreciated MSK: no peripheral edema, moves all extremities independently NEURO: CN II-XII intact, no focal deficits, sensation to light touch intact PSYCH: normal mood/affect Integumentary: dry/intact, no rashes or wounds    Data Reviewed: I have personally reviewed following labs and imaging studies  CBC: Recent Labs  Lab 10/05/22 1120 10/07/22 1032 10/08/22 0356  WBC 7.0 6.0 7.5  NEUTROABS  --  3.6  --   HGB 13.8 13.8 13.3  HCT 41.3 41.4 40.7  MCV 85.9 85.5 88.5  PLT 310 332 310   Basic Metabolic Panel: Recent Labs  Lab 10/05/22 1120 10/07/22 1032 10/08/22 0356  NA 131* 127* 132*  K 3.9 3.3* 3.3*  CL 92* 86* 97*  CO2 28 29 26   GLUCOSE 118* 107* 102*  BUN 19 15 11   CREATININE 0.72 0.77 0.62   CALCIUM 9.6 8.6* 7.8*  MG  --  1.9 2.1   GFR: Estimated Creatinine Clearance: 128.1 mL/min (by C-G formula based on SCr of 0.62 mg/dL). Liver Function Tests: Recent Labs  Lab 10/05/22 1120 10/07/22 1032  AST 28 19  ALT 43 37  ALKPHOS 60 56  BILITOT 0.7 0.9  PROT 6.7 7.1  ALBUMIN 3.9 3.4*   Recent Labs  Lab 10/05/22 1120 10/07/22 1032  LIPASE 32 45   No results for input(s): "AMMONIA" in the last 168 hours. Coagulation Profile: No results for input(s): "INR", "PROTIME" in the last 168 hours. Cardiac Enzymes: No results for input(s): "CKTOTAL", "CKMB", "CKMBINDEX", "TROPONINI" in the last 168 hours. BNP (last 3 results) No results for input(s): "PROBNP" in the last 8760 hours. HbA1C: No results for input(s): "HGBA1C" in the last 72 hours. CBG: Recent Labs  Lab 10/07/22 1728 10/07/22 2201 10/08/22 0057 10/08/22 0445  GLUCAP 99 94 94 100*   Lipid Profile: No results for input(s): "CHOL", "HDL", "LDLCALC", "TRIG", "CHOLHDL", "LDLDIRECT" in the last 72 hours. Thyroid Function Tests: No results for input(s): "TSH", "T4TOTAL", "FREET4", "T3FREE", "THYROIDAB" in the  last 72 hours. Anemia Panel: No results for input(s): "VITAMINB12", "FOLATE", "FERRITIN", "TIBC", "IRON", "RETICCTPCT" in the last 72 hours. Sepsis Labs: Recent Labs  Lab 10/07/22 1234  LATICACIDVEN 1.1    Recent Results (from the past 240 hour(s))  Urine Culture     Status: Abnormal   Collection Time: 10/05/22 11:24 AM   Specimen: Urine, Clean Catch  Result Value Ref Range Status   Specimen Description   Final    URINE, CLEAN CATCH Performed at [facility], [address] [place], [place] [address]    Special Requests   Final    NONE Performed at [facility], [address] [place], [place] [address]    Culture >=100,000 COLONIES/mL ESCHERICHIA COLI (A)  Final   Report Status 10/07/2022 FINAL  Final   Organism ID, Bacteria ESCHERICHIA COLI (A)  Final       Susceptibility   Escherichia coli - MIC*    AMPICILLIN 4 SENSITIVE Sensitive     CEFAZOLIN <=4 SENSITIVE Sensitive     CEFEPIME <=0.12 SENSITIVE Sensitive     CEFTRIAXONE <=0.25 SENSITIVE Sensitive     CIPROFLOXACIN <=0.25 SENSITIVE Sensitive     GENTAMICIN <=1 SENSITIVE Sensitive     IMIPENEM <=0.25 SENSITIVE Sensitive     NITROFURANTOIN <=16 SENSITIVE Sensitive     TRIMETH/SULFA <=20 SENSITIVE Sensitive     AMPICILLIN/SULBACTAM <=2 SENSITIVE Sensitive     PIP/TAZO <=4 SENSITIVE Sensitive     * >=100,000 COLONIES/mL ESCHERICHIA COLI         Radiology Studies: DG Abd Portable 1V-Small Bowel Obstruction Protocol-initial, 8 hr delay  Result Date: 10/08/2022 CLINICAL DATA:  8 hour delay of small bowel obstruction EXAM: PORTABLE ABDOMEN - 1 VIEW COMPARISON:  Yesterday FINDINGS: Enteric tube with tip at the stomach and side port near the GE junction. Enteric contrast is within nondilated colon. There is still a distended loop of small bowel in the left abdomen. No concerning mass effect or calcification.  Lung bases are clear. IMPRESSION: 1. Bowel obstruction is partial with enteric contrast reaching the nondilated colon. Persisting loop of dilated small bowel in the left abdomen. 2. Enteric tube with tip at the stomach and side port near the GE junction. Electronically Signed   By: Tiburcio Pea M.D.   On: 10/08/2022 07:47   US RENAL  Result Date: 10/07/2022 CLINICAL DATA:  Evaluate renal mass. EXAM: RENAL / URINARY TRACT ULTRASOUND COMPLETE COMPARISON:  CT AP 10/07/22. FINDINGS: Right Kidney: Renal measurements: 13.6 x 6.6 x 7.1 cm = volume: 333.7 mL. Echogenicity within normal limits. No hydronephrosis visualized. Complex cystic structure corresponding to the CT finding is noted arising off the upper pole of left kidney measuring 3 x 2.9 x 2.4 cm. This contains diffuse low-level internal echoes and possible thin internal area of septation. Adjacent simple appearing cyst measures 1.4 x 1.7 x  1.6 cm. Left Kidney: Renal measurements: 13.2 x 6.5 x 7.8 cm = volume: 346.3 mL. Echogenicity within normal limits. No mass or hydronephrosis visualized. Bladder: Appears normal for degree of bladder distention. Other: None. IMPRESSION: 1. No hydronephrosis. 2. Complex cystic structure arising off the upper pole of the left kidney measuring 3 cm. This corresponds to the CT finding and is compatible with a complex renal cyst. This does not require emergent/stat follow-up. When the patient is clinically stable and able to follow directions and hold their breath (preferably as an outpatient) further evaluation with dedicated abdominal MRI should be considered. 3.  Simple appearing cyst arising off the upper pole of the right kidney measures 1.7 cm. Electronically Signed   By: Signa Kell M.D.   On: 10/07/2022 15:39   DG Abd Portable 1 View  Result Date: 10/07/2022 CLINICAL DATA:  Enteric tube placement EXAM: PORTABLE ABDOMEN - 1 VIEW COMPARISON:  CT abdomen and pelvis dated 10/07/2022 FINDINGS: Gastric/enteric tube tip projects over the stomach with side hole projecting over the gastric cardia. Partially imaged lung bases are clear. IMPRESSION: Gastric/enteric tube tip projects over the stomach with side hole projecting over the gastric cardia. Recommend advancing by 5 cm for more optimal positioning. Electronically Signed   By: Agustin Cree M.D.   On: 10/07/2022 14:31   CT ABDOMEN PELVIS W CONTRAST  Result Date: 10/07/2022 CLINICAL DATA:  Abdominal pain, acute, nonlocalized EXAM: CT ABDOMEN AND PELVIS WITH CONTRAST TECHNIQUE: Multidetector CT imaging of the abdomen and pelvis was performed using the standard protocol following bolus administration of intravenous contrast. RADIATION DOSE REDUCTION: This exam was performed according to the departmental dose-optimization program which includes automated exposure control, adjustment of the mA and/or kV according to patient size and/or use of iterative  reconstruction technique. CONTRAST:  OMNIPAQUE IOHEXOL 300 MG/ML  SOLN COMPARISON:  CT pelvis 05/05/2014 FINDINGS: Lower chest: No acute abnormality. Hepatobiliary: No focal liver abnormality is seen. No gallstones, gallbladder wall thickening, or biliary dilatation. Pancreas: Unremarkable. No pancreatic ductal dilatation or surrounding inflammatory changes. Spleen: Normal in size without focal abnormality. Adrenals/Urinary Tract: Unremarkable adrenal glands. There is a exophytic 3.9 cm indeterminate lesion arising from the anterior aspect of the upper pole of the right kidney with internal density of 39 HU. Additional 1.6 cm right renal cyst. There are several other subcentimeter low-density lesions within both kidneys, which are too small to characterize. No renal stone or hydronephrosis. Urinary bladder is largely obscured by metal artifact related to patient's hip prostheses. Stomach/Bowel: Stomach is moderately distended with fluid. Multiple mildly dilated loops of small bowel within the abdomen measuring up to 5.1 cm in diameter. Tapered area of transition to nondilated small bowel within the mid abdomen (series 2, image 58). No abrupt transition point. Normal appendix in the right lower quadrant. Scattered colonic diverticulosis. No focal colonic wall thickening or inflammatory changes. Vascular/Lymphatic: Scattered aortoiliac atherosclerotic calcifications without aneurysm. No abdominopelvic lymphadenopathy. Reproductive: Obscured by streak artifact within the pelvis. Other: Trace perihepatic free fluid. No abdominopelvic fluid collection. No pneumoperitoneum. No abdominal wall hernia. Musculoskeletal: Bilateral hip prostheses. Prior L4-5 spinal fusion. Advanced multilevel degenerative disc disease of the lumbar spine. No acute bony findings. IMPRESSION: 1. Multiple mildly dilated loops of small bowel within the abdomen measuring up to 5.1 cm in diameter. Tapered area of transition to nondilated small  bowel within the mid abdomen. No abrupt transition point. Findings are most compatible with a low-grade or partial small bowel obstruction. 2. Indeterminate 3.9 cm exophytic lesion arising from the anterior aspect of the upper pole of the right kidney. Further evaluation with nonemergent renal ultrasound is recommended. 3. Colonic diverticulosis without evidence of acute diverticulitis. 4. Trace perihepatic free fluid, likely reactive. 5. Aortic atherosclerosis (ICD10-I70.0). Electronically Signed   By: Duanne Guess D.O.   On: 10/07/2022 11:53        Scheduled Meds:  enoxaparin (LOVENOX) injection  70 mg Subcutaneous QHS   metoprolol tartrate  5 mg Intravenous Q6H   pantoprazole (PROTONIX) IV  40 mg Intravenous Q24H   Continuous Infusions:  sodium chloride 100 mL/hr at 10/08/22 0750  ampicillin-sulbactam (UNASYN) IV 3 g (10/08/22 1022)   potassium chloride 10 mEq (10/08/22 1134)     LOS: 1 day    Time spent: 56 minutes spent on chart review, discussion with nursing staff, consultants, updating family and interview/physical exam; more than 50% of that time was spent in counseling and/or coordination of care.    Alvira Philips [place], DO [facility] Available via Epic secure chat 7am-7pm After these hours, please refer to coverage provider listed on [email or web address] 10/08/2022, 1:44 PM

## 2022-10-09 DIAGNOSIS — K56600 Partial intestinal obstruction, unspecified as to cause: Secondary | ICD-10-CM | POA: Diagnosis not present

## 2022-10-09 LAB — GLUCOSE, CAPILLARY
Glucose-Capillary: 103 mg/dL — ABNORMAL HIGH (ref 70–99)
Glucose-Capillary: 102 mg/dL — ABNORMAL HIGH (ref 70–99)
Glucose-Capillary: 87 mg/dL (ref 70–99)
Glucose-Capillary: 104 mg/dL — ABNORMAL HIGH (ref 70–99)
Glucose-Capillary: 141 mg/dL — ABNORMAL HIGH (ref 70–99)

## 2022-10-09 LAB — BASIC METABOLIC PANEL
Anion gap: 12 (ref 5–15)
BUN: 12 mg/dL (ref 8–23)
CO2: 25 mmol/L (ref 22–32)
Calcium: 8 mg/dL — ABNORMAL LOW (ref 8.9–10.3)
Chloride: 100 mmol/L (ref 98–111)
Creatinine, Ser: 0.62 mg/dL (ref 0.61–1.24)
GFR, Estimated: >60 mL/min (ref >60)
Glucose, Bld: 109 mg/dL — ABNORMAL HIGH (ref 70–99)
Potassium: 3.5 mmol/L (ref 3.5–5.1)
Sodium: 137 mmol/L (ref 135–145)

## 2022-10-09 LAB — MAGNESIUM: Magnesium: 2.1 mg/dL (ref 1.7–2.4)

## 2022-10-09 MED ORDER — PROCHLORPERAZINE EDISYLATE 10 MG/2ML IJ SOLN
5.0000 mg | Freq: Once | INTRAMUSCULAR | Status: AC
  Administered 2022-10-10: 5 mg via INTRAVENOUS
  Filled 2022-10-09: qty 2

## 2022-10-09 MED ORDER — METHOCARBAMOL 1000 MG/10ML IJ SOLN
500.0000 mg | Freq: Once | INTRAVENOUS | Status: AC
  Administered 2022-10-09: 500 mg via INTRAVENOUS
  Filled 2022-10-09: qty 500

## 2022-10-09 MED ORDER — DIPHENHYDRAMINE HCL 50 MG/ML IJ SOLN
12.5000 mg | Freq: Once | INTRAMUSCULAR | Status: AC
  Administered 2022-10-09: 12.5 mg via INTRAVENOUS
  Filled 2022-10-09: qty 1

## 2022-10-09 MED ORDER — METHOCARBAMOL 1000 MG/10ML IJ SOLN
500.0000 mg | Freq: Four times a day (QID) | INTRAVENOUS | Status: DC | PRN
  Administered 2022-10-09: 500 mg via INTRAVENOUS
  Filled 2022-10-09: qty 500

## 2022-10-09 MED ORDER — CEFAZOLIN SODIUM-DEXTROSE 2-4 GM/100ML-% IV SOLN
2.0000 g | Freq: Three times a day (TID) | INTRAVENOUS | Status: AC
  Administered 2022-10-09 – 2022-10-11 (×6): 2 g via INTRAVENOUS
  Filled 2022-10-09 (×6): qty 100

## 2022-10-09 MED ORDER — POTASSIUM CHLORIDE 10 MEQ/100ML IV SOLN
10.0000 meq | INTRAVENOUS | Status: AC
  Administered 2022-10-09 (×4): 10 meq via INTRAVENOUS
  Filled 2022-10-09 (×4): qty 100

## 2022-10-09 NOTE — Progress Notes (Signed)
Pt ate his 100% of lunch (clear liq)... tolerated well... denies nausea, vomiting, abd pain.... education given to pt... pt verb understanding.

## 2022-10-09 NOTE — Progress Notes (Signed)
VO given to remove NG after clamping trial x4 hours.... after 4 hours of clamping, pt denies abd pain, nausea... has had about 6-8 oz of ice/water and has had 50 cc of residual after un-clamping.... Pt is A&O x4... provided education ... verb understanding

## 2022-10-09 NOTE — TOC CM/SW Note (Signed)
Transition of Care [facility]) - Inpatient Brief Assessment   Patient Details  Name: [name] MRN: [id number] Date of Birth: [birth year]  [facility] CM/SW Contact:    Otelia Santee, LCSW Phone Number: 10/09/2022, 11:06 AM   Clinical Narrative: Chart reviewed. No TOC needs identified at this time.    Transition of Care Asessment: Insurance and Status: Insurance coverage has been reviewed Patient has primary care physician: Yes Home environment has been reviewed: Home w/ spouse Prior level of function:: Independent Prior/Current Home Services: No current home services Social Determinants of Health Reivew: SDOH reviewed no interventions necessary Readmission risk has been reviewed: Yes Transition of care needs: no transition of care needs at this time

## 2022-10-09 NOTE — Progress Notes (Signed)
PROGRESS NOTE    [name]  ZHY:[id number] DOB: 1955-12-04 DOA: 10/07/2022 PCP: [name], MD    Brief Narrative:   [name] is a 67 y.o. male with past medical history significant for HTN, HLD, OSA, chronic low back pain, chronic neck pain s/p decompressive cervical laminectomy with fusion 2019 who presented to [facility] ED on 6/22 with nausea, vomiting, abdominal pain.  Ongoing since Tuesday with last bowel movement Wednesday.  Reports passing flatus but unable to tolerate p.o. intake since Tuesday.  No previous history of bowel obstructions with no prior abdominal surgery past.  Recently seen in the ED with a urinary tract infection with culture positive for E. coli and currently on antibiotics.  In the ED, temperature 98.1 F, HR 88, RR 18, BP 147/76, SpO2 95% on room air.  WBC 6.0, hemoglobin 13.8, platelets 332.  Sodium 127, potassium 3.3, chloride 86, CO2 29, glucose 107, BUN 15, creatinine 0.77.  Lipase 45.  AST 19, ALT 37, total bilirubin 0.9.  Lactic acid 1.1.  Urinalysis unrevealing.  UDS positive for opiates.  CT Abdo/pelvis with multiple dilated loops of small bowel within the abdomen measuring up to 5.1 cm with transition in the mid abdomen consistent with partial small bowel obstruction, indeterminate 3.9 cm exophytic lesion anterior aspect upper pole right kidney colonic diverticulosis without acute diverticulitis.  General surgery was consulted.  NG tube was placed to wall suction.  TRH consulted for admission for further evaluation management of small bowel obstruction.  Assessment & Plan:   Partial small bowel obstruction versus ileus Patient presenting with abdominal pain associate with nausea and vomiting and inability to tolerate oral intake.  Patient was afebrile without leukocytosis.  Urinalysis unrevealing.  CT abdomen/pelvis with findings consistent with small bowel obstruction with transition point in the mid abdomen. -- General surgery  following, appreciate assistance -- NGT with out past 24h; but patient admits to utilizing multiple cups of ice -- NPO, NS at 126mL/h; clamping trial this morning per general surgery  Left renal complex cyst Incidental finding of 3.9 cm exophytic lesion anterior aspect upper pole right kidney.  Renal ultrasound with complex cystic structure upper pole left kidney measuring 3 cm compatible with a complex renal cyst, simple appearing cyst upper pole right kidney 1.7 cm. -- Outpatient abdominal MRI  Hyponatremia Sodium 127 admission, likely secondary to hypovolemic hyponatremia in the setting of dehydration secondary to bowel obstruction with GI loss from nausea/vomiting. -- Na 295>284>132 -- IVF w/ NS at 127mL/h -- BMP in am  E. coli UTI Recently seen in the ED, diagnosed on 6/20. -- Continue IV Unasyn  Essential hypertension Home regimen includes amlodipine 10 mg p.o. daily, losartan-HCTZ 100-25 mg p.o. daily -- Hold oral hypertensives while NPO -- Metoprolol tartrate 5 mg IV every 6 hours -- Hydralazine 10 mg IV every 4 hours as needed SBP greater than 170  Hyperlipidemia -- Holding home atorvastatin while NPO  OSA -- Oxygen as needed, unable to utilize CPAP given NG tube in place  Morbid obesity Body mass index is 44.07 kg/m.  Discussed with patient needs for aggressive lifestyle changes/weight loss as this complicates all facets of care.  Outpatient follow-up with PCP.    DVT prophylaxis: Lovenox    Code Status: Full Code Family Communication: No family present at bedside  Disposition Plan:  Level of care: Med-Surg Status is: Inpatient Remains inpatient appropriate because: N.p.o., NG tube clamped this morning, needs further advancement of diet with toleration  and general surgery signed off before stable for discharge home    Consultants:  General surgery  Procedures:  None  Antimicrobials:  Unasyn 6/22>>   Subjective: Patient seen examined bedside,  resting calmly.  Lying in bed.  Family present.  Continues with significant output from NG tube but admits to utilizing multiple cups of ice over the last 24 hours.  3 bowel movements reported yesterday and once this morning.  Seen by general surgery and NG tube now clamped.  Reports flatus.  No other specific complaints or concerns at this time.  Denies headache, no dizziness, no chest pain, no palpitations, no shortness of breath, no abdominal pain, no fever/chills/night sweats, no nausea cefonicid diarrhea, no focal weakness, no fatigue, no paresthesias.  No acute events overnight per nursing staff.  Objective: Vitals:   10/09/22 0142 10/09/22 0231 10/09/22 0239 10/09/22 0614  BP: (!) 199/76 (!) 182/85 (!) 161/86 (!) 169/77  Pulse:    94  Resp:    18  Temp:    97.9 F (36.6 C)  TempSrc:    Oral  SpO2:    95%  Weight:      Height:        Intake/Output Summary (Last 24 hours) at 10/09/2022 1134 Last data filed at 10/09/2022 0800 Gross per 24 hour  Intake 3078.63 ml  Output 4700 ml  Net -1621.37 ml   Filed Weights   10/07/22 1009  Weight: (!) 143.3 kg    Examination:  Physical Exam: GEN: NAD, alert and oriented x 3, morbid obesity HEENT: NCAT, PERRL, EOMI, sclera clear, MMM PULM: CTAB w/o wheezes/crackles, normal respiratory effort, on room air CV: RRR w/o M/G/R GI: abd soft, NTND, faint bowel sounds appreciated MSK: no peripheral edema, moves all extremities independently NEURO: CN II-XII intact, no focal deficits, sensation to light touch intact PSYCH: normal mood/affect Integumentary: dry/intact, no rashes or wounds    Data Reviewed: I have personally reviewed following labs and imaging studies  CBC: Recent Labs  Lab 10/05/22 1120 10/07/22 1032 10/08/22 0356  WBC 7.0 6.0 7.5  NEUTROABS  --  3.6  --   HGB 13.8 13.8 13.3  HCT 41.3 41.4 40.7  MCV 85.9 85.5 88.5  PLT 310 332 310   Basic Metabolic Panel: Recent Labs  Lab 10/05/22 1120 10/07/22 1032  10/08/22 0356 10/09/22 0338  NA 131* 127* 132* 137  K 3.9 3.3* 3.3* 3.5  CL 92* 86* 97* 100  CO2 28 29 26 25   GLUCOSE 118* 107* 102* 109*  BUN 19 15 11 12   CREATININE 0.72 0.77 0.62 0.62  CALCIUM 9.6 8.6* 7.8* 8.0*  MG  --  1.9 2.1 2.1   GFR: Estimated Creatinine Clearance: 128.1 mL/min (by C-G formula based on SCr of 0.62 mg/dL). Liver Function Tests: Recent Labs  Lab 10/05/22 1120 10/07/22 1032  AST 28 19  ALT 43 37  ALKPHOS 60 56  BILITOT 0.7 0.9  PROT 6.7 7.1  ALBUMIN 3.9 3.4*   Recent Labs  Lab 10/05/22 1120 10/07/22 1032  LIPASE 32 45   No results for input(s): "AMMONIA" in the last 168 hours. Coagulation Profile: No results for input(s): "INR", "PROTIME" in the last 168 hours. Cardiac Enzymes: No results for input(s): "CKTOTAL", "CKMB", "CKMBINDEX", "TROPONINI" in the last 168 hours. BNP (last 3 results) No results for input(s): "PROBNP" in the last 8760 hours. HbA1C: No results for input(s): "HGBA1C" in the last 72 hours. CBG: Recent Labs  Lab 10/08/22 1748 10/08/22 2042 10/08/22 2337  10/09/22 0324 10/09/22 0802  GLUCAP 86 90 96 103* 102*   Lipid Profile: No results for input(s): "CHOL", "HDL", "LDLCALC", "TRIG", "CHOLHDL", "LDLDIRECT" in the last 72 hours. Thyroid Function Tests: No results for input(s): "TSH", "T4TOTAL", "FREET4", "T3FREE", "THYROIDAB" in the last 72 hours. Anemia Panel: No results for input(s): "VITAMINB12", "FOLATE", "FERRITIN", "TIBC", "IRON", "RETICCTPCT" in the last 72 hours. Sepsis Labs: Recent Labs  Lab 10/07/22 1234  LATICACIDVEN 1.1    Recent Results (from the past 240 hour(s))  Urine Culture     Status: Abnormal   Collection Time: 10/05/22 11:24 AM   Specimen: Urine, Clean Catch  Result Value Ref Range Status   Specimen Description   Final    URINE, CLEAN CATCH Performed at [facility], [address] [place], [place] [address]    Special Requests   Final    NONE Performed at [facility], [address] [place], [place] [address]    Culture >=100,000 COLONIES/mL ESCHERICHIA COLI (A)  Final   Report Status 10/07/2022 FINAL  Final   Organism ID, Bacteria ESCHERICHIA COLI (A)  Final      Susceptibility   Escherichia coli - MIC*    AMPICILLIN 4 SENSITIVE Sensitive     CEFAZOLIN <=4 SENSITIVE Sensitive     CEFEPIME <=0.12 SENSITIVE Sensitive     CEFTRIAXONE <=0.25 SENSITIVE Sensitive     CIPROFLOXACIN <=0.25 SENSITIVE Sensitive     GENTAMICIN <=1 SENSITIVE Sensitive     IMIPENEM <=0.25 SENSITIVE Sensitive     NITROFURANTOIN <=16 SENSITIVE Sensitive     TRIMETH/SULFA <=20 SENSITIVE Sensitive     AMPICILLIN/SULBACTAM <=2 SENSITIVE Sensitive     PIP/TAZO <=4 SENSITIVE Sensitive     * >=100,000 COLONIES/mL ESCHERICHIA COLI         Radiology Studies: DG Abd Portable 1V-Small Bowel Obstruction Protocol-initial, 8 hr delay  Result Date: 10/08/2022 CLINICAL DATA:  8 hour delay of small bowel obstruction EXAM: PORTABLE ABDOMEN - 1 VIEW COMPARISON:  Yesterday FINDINGS: Enteric tube with tip at the stomach and side port near the GE junction. Enteric contrast is within nondilated colon. There is still a distended loop of small bowel in the left abdomen. No concerning mass effect or calcification.  Lung bases are clear. IMPRESSION: 1. Bowel obstruction is partial with enteric contrast reaching the nondilated colon. Persisting loop of dilated small bowel in the left abdomen. 2. Enteric tube with tip at the stomach and side port near the GE junction. Electronically Signed   By: Tiburcio Pea M.D.   On: 10/08/2022 07:47   US RENAL  Result Date: 10/07/2022 CLINICAL DATA:  Evaluate renal mass. EXAM: RENAL / URINARY TRACT ULTRASOUND COMPLETE COMPARISON:  CT AP 10/07/22. FINDINGS: Right Kidney: Renal measurements: 13.6 x 6.6 x 7.1 cm = volume: 333.7 mL. Echogenicity within normal limits. No hydronephrosis visualized. Complex cystic structure corresponding to the CT finding is noted  arising off the upper pole of left kidney measuring 3 x 2.9 x 2.4 cm. This contains diffuse low-level internal echoes and possible thin internal area of septation. Adjacent simple appearing cyst measures 1.4 x 1.7 x 1.6 cm. Left Kidney: Renal measurements: 13.2 x 6.5 x 7.8 cm = volume: 346.3 mL. Echogenicity within normal limits. No mass or hydronephrosis visualized. Bladder: Appears normal for degree of bladder distention. Other: None. IMPRESSION: 1. No hydronephrosis. 2. Complex cystic structure arising off the upper pole of the left kidney measuring 3 cm. This corresponds to the CT  finding and is compatible with a complex renal cyst. This does not require emergent/stat follow-up. When the patient is clinically stable and able to follow directions and hold their breath (preferably as an outpatient) further evaluation with dedicated abdominal MRI should be considered. 3. Simple appearing cyst arising off the upper pole of the right kidney measures 1.7 cm. Electronically Signed   By: Signa Kell M.D.   On: 10/07/2022 15:39   DG Abd Portable 1 View  Result Date: 10/07/2022 CLINICAL DATA:  Enteric tube placement EXAM: PORTABLE ABDOMEN - 1 VIEW COMPARISON:  CT abdomen and pelvis dated 10/07/2022 FINDINGS: Gastric/enteric tube tip projects over the stomach with side hole projecting over the gastric cardia. Partially imaged lung bases are clear. IMPRESSION: Gastric/enteric tube tip projects over the stomach with side hole projecting over the gastric cardia. Recommend advancing by 5 cm for more optimal positioning. Electronically Signed   By: Agustin Cree M.D.   On: 10/07/2022 14:31   CT ABDOMEN PELVIS W CONTRAST  Result Date: 10/07/2022 CLINICAL DATA:  Abdominal pain, acute, nonlocalized EXAM: CT ABDOMEN AND PELVIS WITH CONTRAST TECHNIQUE: Multidetector CT imaging of the abdomen and pelvis was performed using the standard protocol following bolus administration of intravenous contrast. RADIATION DOSE REDUCTION:  This exam was performed according to the departmental dose-optimization program which includes automated exposure control, adjustment of the mA and/or kV according to patient size and/or use of iterative reconstruction technique. CONTRAST:  OMNIPAQUE IOHEXOL 300 MG/ML  SOLN COMPARISON:  CT pelvis 05/05/2014 FINDINGS: Lower chest: No acute abnormality. Hepatobiliary: No focal liver abnormality is seen. No gallstones, gallbladder wall thickening, or biliary dilatation. Pancreas: Unremarkable. No pancreatic ductal dilatation or surrounding inflammatory changes. Spleen: Normal in size without focal abnormality. Adrenals/Urinary Tract: Unremarkable adrenal glands. There is a exophytic 3.9 cm indeterminate lesion arising from the anterior aspect of the upper pole of the right kidney with internal density of 39 HU. Additional 1.6 cm right renal cyst. There are several other subcentimeter low-density lesions within both kidneys, which are too small to characterize. No renal stone or hydronephrosis. Urinary bladder is largely obscured by metal artifact related to patient's hip prostheses. Stomach/Bowel: Stomach is moderately distended with fluid. Multiple mildly dilated loops of small bowel within the abdomen measuring up to 5.1 cm in diameter. Tapered area of transition to nondilated small bowel within the mid abdomen (series 2, image 58). No abrupt transition point. Normal appendix in the right lower quadrant. Scattered colonic diverticulosis. No focal colonic wall thickening or inflammatory changes. Vascular/Lymphatic: Scattered aortoiliac atherosclerotic calcifications without aneurysm. No abdominopelvic lymphadenopathy. Reproductive: Obscured by streak artifact within the pelvis. Other: Trace perihepatic free fluid. No abdominopelvic fluid collection. No pneumoperitoneum. No abdominal wall hernia. Musculoskeletal: Bilateral hip prostheses. Prior L4-5 spinal fusion. Advanced multilevel degenerative disc disease of  the lumbar spine. No acute bony findings. IMPRESSION: 1. Multiple mildly dilated loops of small bowel within the abdomen measuring up to 5.1 cm in diameter. Tapered area of transition to nondilated small bowel within the mid abdomen. No abrupt transition point. Findings are most compatible with a low-grade or partial small bowel obstruction. 2. Indeterminate 3.9 cm exophytic lesion arising from the anterior aspect of the upper pole of the right kidney. Further evaluation with nonemergent renal ultrasound is recommended. 3. Colonic diverticulosis without evidence of acute diverticulitis. 4. Trace perihepatic free fluid, likely reactive. 5. Aortic atherosclerosis (ICD10-I70.0). Electronically Signed   By: Duanne Guess D.O.   On: 10/07/2022 11:53  Scheduled Meds:  enoxaparin (LOVENOX) injection  70 mg Subcutaneous QHS   metoprolol tartrate  5 mg Intravenous Q6H   pantoprazole (PROTONIX) IV  40 mg Intravenous Q24H   Continuous Infusions:  sodium chloride 100 mL/hr at 10/09/22 0800    ceFAZolin (ANCEF) IV     methocarbamol (ROBAXIN) IV     potassium chloride 10 mEq (10/09/22 1023)     LOS: 2 days    Time spent: 50 minutes spent on chart review, discussion with nursing staff, consultants, updating family and interview/physical exam; more than 50% of that time was spent in counseling and/or coordination of care.    Alvira Philips [place], DO [facility] Available via Epic secure chat 7am-7pm After these hours, please refer to coverage provider listed on [email or web address] 10/09/2022, 11:34 AM

## 2022-10-09 NOTE — Progress Notes (Signed)
Initial Nutrition Assessment  DOCUMENTATION CODES:   Morbid obesity  INTERVENTION:  - Clear Liquid diet per Surgery. - Boost Breeze po BID once medically appropriate, each supplement provides 250 kcal and 9 grams of protein   NUTRITION DIAGNOSIS:   Inadequate oral intake related to vomiting, acute illness (partial small bowel obstruction) as evidenced by per patient/family report, energy intake < or equal to 50% for > or equal to 5 days.  GOAL:   Patient will meet greater than or equal to 90% of their needs  MONITOR:   PO intake, Diet advancement  REASON FOR ASSESSMENT:   Malnutrition Screening Tool    ASSESSMENT:   67 y.o. male with PMH significant for HTN, HLD, OSA, chronic neck pain s/p decompressive cervical laminectomy with fusion 2019 who presented with nausea, vomiting, abdominal pain. Admitted for partial small bowel obstruction.  Patient reports a UBW of 315-320#. No changes in weight until the last ~1 week as he has not been eating well and has been vomiting. Per EMR, last weight prior to admission was more than 1 year ago in April 2023.   Patient states he typically consumes 3 meals a day at home with normal appetite. Was eating well until he started to feel poorly last Tuesday and since that time was throwing up a lot and not able to eat much.  He has been NPO since admit, allowed to have ice chips which he reports he has been consuming a lot of. NGT to LIS and he has had of output over the past 24 hours. However, likely from multiple cups of ice chips patient has been taking in. Surgery okay with NGT clamp trial this AM and plan to remove if patient does well. Patient is eager to have diet advanced, encouraged him to go slow and monitor tolerance once advanced.  After visit with patient, orders placed for clear liquid diet and NGT removal.    Medications reviewed and include: -  Labs reviewed:  -   NUTRITION - FOCUSED PHYSICAL EXAM:  No  depletions  Diet Order:   Diet Order             Diet clear liquid Fluid consistency: Thin  Diet effective now                   EDUCATION NEEDS:  Education needs have been addressed  Skin:     Last BM:  6/24  Height:  Ht Readings from Last 1 Encounters:  10/07/22 5\' 11"  (1.803 m)   Weight: Wt Readings from Last 1 Encounters:  10/07/22 (!) 143.3 kg   Ideal Body Weight:  78.18 kg  BMI:  Body mass index is 44.07 kg/m.  Estimated Nutritional Needs:  Kcal:  2250-2450 kcals Protein:  115-130 grams Fluid:  >/= 2.2L    Shelle Iron RD, LDN For contact information, refer to [facility].

## 2022-10-09 NOTE — Progress Notes (Signed)
Subjective: CC: Reports abdominal pain has resolved. Reports pain medication he is taking is for chronic back pain that he is on percocet at baseline for. Some nausea but improved. NGT output 3.3L but reports he has drank 6 cups of ice/water in the last 24 hours. Passing flatus. 3 liquid bm's yesterday and 1 today.   Objective: Vital signs in last 24 hours: Temp:  [97.8 F (36.6 C)-98.5 F (36.9 C)] 97.9 F (36.6 C) (06/24 0614) Pulse Rate:  [71-94] 94 (06/24 0614) Resp:  [18-20] 18 (06/24 0614) BP: (161-199)/(65-87) 169/77 (06/24 0614) SpO2:  [91 %-95 %] 95 % (06/24 0614) Last BM Date : 10/08/22  Intake/Output from previous day: 06/23 0701 - 06/24 0700 In: 2818.2 [P.O.:360; I.V.:1481.4; IV Piggyback:976.8] Out: 4400 [Urine:1100; Emesis/NG output:3300] Intake/Output this shift: No intake/output data recorded.  PE: Gen:  Alert, NAD, pleasant Abd: Obese, soft, does not appear distended, completely NT, +BS. Small umbilical hernia that is NT, reducible and without overlying skin changes. NGT output thin and non-bilious.   Lab Results:  Recent Labs    10/07/22 1032 10/08/22 0356  WBC 6.0 7.5  HGB 13.8 13.3  HCT 41.4 40.7  PLT 332 310   BMET Recent Labs    10/08/22 0356 10/09/22 0338  NA 132* 137  K 3.3* 3.5  CL 97* 100  CO2 26 25  GLUCOSE 102* 109*  BUN 11 12  CREATININE 0.62 0.62  CALCIUM 7.8* 8.0*   PT/INR No results for input(s): "LABPROT", "INR" in the last 72 hours. CMP     Component Value Date/Time   NA 137 10/09/2022 0338   K 3.5 10/09/2022 0338   CL 100 10/09/2022 0338   CO2 25 10/09/2022 0338   GLUCOSE 109 (H) 10/09/2022 0338   BUN 12 10/09/2022 0338   CREATININE 0.62 10/09/2022 0338   CALCIUM 8.0 (L) 10/09/2022 0338   PROT 7.1 10/07/2022 1032   ALBUMIN 3.4 (L) 10/07/2022 1032   AST 19 10/07/2022 1032   ALT 37 10/07/2022 1032   ALKPHOS 56 10/07/2022 1032   BILITOT 0.9 10/07/2022 1032   GFRNONAA >60 10/09/2022 0338   GFRAA >60  04/27/2017 1519   Lipase     Component Value Date/Time   LIPASE 45 10/07/2022 1032    Studies/Results: DG Abd Portable 1V-Small Bowel Obstruction Protocol-initial, 8 hr delay  Result Date: 10/08/2022 CLINICAL DATA:  8 hour delay of small bowel obstruction EXAM: PORTABLE ABDOMEN - 1 VIEW COMPARISON:  Yesterday FINDINGS: Enteric tube with tip at the stomach and side port near the GE junction. Enteric contrast is within nondilated colon. There is still a distended loop of small bowel in the left abdomen. No concerning mass effect or calcification.  Lung bases are clear. IMPRESSION: 1. Bowel obstruction is partial with enteric contrast reaching the nondilated colon. Persisting loop of dilated small bowel in the left abdomen. 2. Enteric tube with tip at the stomach and side port near the GE junction. Electronically Signed   By: Tiburcio Pea M.D.   On: 10/08/2022 07:47   US RENAL  Result Date: 10/07/2022 CLINICAL DATA:  Evaluate renal mass. EXAM: RENAL / URINARY TRACT ULTRASOUND COMPLETE COMPARISON:  CT AP 10/07/22. FINDINGS: Right Kidney: Renal measurements: 13.6 x 6.6 x 7.1 cm = volume: 333.7 mL. Echogenicity within normal limits. No hydronephrosis visualized. Complex cystic structure corresponding to the CT finding is noted arising off the upper pole of left kidney measuring 3 x 2.9 x 2.4 cm. This contains  diffuse low-level internal echoes and possible thin internal area of septation. Adjacent simple appearing cyst measures 1.4 x 1.7 x 1.6 cm. Left Kidney: Renal measurements: 13.2 x 6.5 x 7.8 cm = volume: 346.3 mL. Echogenicity within normal limits. No mass or hydronephrosis visualized. Bladder: Appears normal for degree of bladder distention. Other: None. IMPRESSION: 1. No hydronephrosis. 2. Complex cystic structure arising off the upper pole of the left kidney measuring 3 cm. This corresponds to the CT finding and is compatible with a complex renal cyst. This does not require emergent/stat follow-up.  When the patient is clinically stable and able to follow directions and hold their breath (preferably as an outpatient) further evaluation with dedicated abdominal MRI should be considered. 3. Simple appearing cyst arising off the upper pole of the right kidney measures 1.7 cm. Electronically Signed   By: Signa Kell M.D.   On: 10/07/2022 15:39   DG Abd Portable 1 View  Result Date: 10/07/2022 CLINICAL DATA:  Enteric tube placement EXAM: PORTABLE ABDOMEN - 1 VIEW COMPARISON:  CT abdomen and pelvis dated 10/07/2022 FINDINGS: Gastric/enteric tube tip projects over the stomach with side hole projecting over the gastric cardia. Partially imaged lung bases are clear. IMPRESSION: Gastric/enteric tube tip projects over the stomach with side hole projecting over the gastric cardia. Recommend advancing by 5 cm for more optimal positioning. Electronically Signed   By: Agustin Cree M.D.   On: 10/07/2022 14:31   CT ABDOMEN PELVIS W CONTRAST  Result Date: 10/07/2022 CLINICAL DATA:  Abdominal pain, acute, nonlocalized EXAM: CT ABDOMEN AND PELVIS WITH CONTRAST TECHNIQUE: Multidetector CT imaging of the abdomen and pelvis was performed using the standard protocol following bolus administration of intravenous contrast. RADIATION DOSE REDUCTION: This exam was performed according to the departmental dose-optimization program which includes automated exposure control, adjustment of the mA and/or kV according to patient size and/or use of iterative reconstruction technique. CONTRAST:  OMNIPAQUE IOHEXOL 300 MG/ML  SOLN COMPARISON:  CT pelvis 05/05/2014 FINDINGS: Lower chest: No acute abnormality. Hepatobiliary: No focal liver abnormality is seen. No gallstones, gallbladder wall thickening, or biliary dilatation. Pancreas: Unremarkable. No pancreatic ductal dilatation or surrounding inflammatory changes. Spleen: Normal in size without focal abnormality. Adrenals/Urinary Tract: Unremarkable adrenal glands. There is a  exophytic 3.9 cm indeterminate lesion arising from the anterior aspect of the upper pole of the right kidney with internal density of 39 HU. Additional 1.6 cm right renal cyst. There are several other subcentimeter low-density lesions within both kidneys, which are too small to characterize. No renal stone or hydronephrosis. Urinary bladder is largely obscured by metal artifact related to patient's hip prostheses. Stomach/Bowel: Stomach is moderately distended with fluid. Multiple mildly dilated loops of small bowel within the abdomen measuring up to 5.1 cm in diameter. Tapered area of transition to nondilated small bowel within the mid abdomen (series 2, image 58). No abrupt transition point. Normal appendix in the right lower quadrant. Scattered colonic diverticulosis. No focal colonic wall thickening or inflammatory changes. Vascular/Lymphatic: Scattered aortoiliac atherosclerotic calcifications without aneurysm. No abdominopelvic lymphadenopathy. Reproductive: Obscured by streak artifact within the pelvis. Other: Trace perihepatic free fluid. No abdominopelvic fluid collection. No pneumoperitoneum. No abdominal wall hernia. Musculoskeletal: Bilateral hip prostheses. Prior L4-5 spinal fusion. Advanced multilevel degenerative disc disease of the lumbar spine. No acute bony findings. IMPRESSION: 1. Multiple mildly dilated loops of small bowel within the abdomen measuring up to 5.1 cm in diameter. Tapered area of transition to nondilated small bowel within the mid abdomen. No abrupt  transition point. Findings are most compatible with a low-grade or partial small bowel obstruction. 2. Indeterminate 3.9 cm exophytic lesion arising from the anterior aspect of the upper pole of the right kidney. Further evaluation with nonemergent renal ultrasound is recommended. 3. Colonic diverticulosis without evidence of acute diverticulitis. 4. Trace perihepatic free fluid, likely reactive. 5. Aortic atherosclerosis (ICD10-I70.0).  Electronically Signed   By: Duanne Guess D.O.   On: 10/07/2022 11:53    Anti-infectives: Anti-infectives (From admission, onward)    Start     Dose/Rate Route Frequency Ordered Stop   10/07/22 1500  Ampicillin-Sulbactam (UNASYN) 3 g in sodium chloride 0.9 % 100 mL IVPB        3 g 200 mL/hr over 30 Minutes Intravenous Every 6 hours 10/07/22 1433     10/07/22 1100  cefTRIAXone (ROCEPHIN) 1 g in sodium chloride 0.9 % 100 mL IVPB  Status:  Discontinued        1 g 200 mL/hr over 30 Minutes Intravenous  Once 10/07/22 1053 10/07/22 1054        Assessment/Plan SBO - CT 6/22 w/ dilated small bowel with tapered area of transition to nondilated small bowel within the mid abdomen. No abrupt transition point.  No prior history of bowel obstructions and has had no prior abdominal surgery. Per report he has had a recent colonoscopy which was unremarkable.  - HDS without fever, tachycardia or hypotension. WBC 7.5 (6/23).  No peritonitis on exam. Contrast in colon on xray 6/23. Patient now having bowel function. Given improvement and reassuring signs as above, no current indication for emergency surgery - Patient without typical risk factors for SBO (no prior abdominal surgeries, abdominopelvic radiation, hernia at point of obstruction, or prior abdominopelvic infectious/inflammatory disorders tx non-op). Agree with Dr. Magnus Ivan that given hx of recent infection this could be related to ileus, however will continue to follow with you to ensure that patient improves.  - Keep K > 4, Mg > 2 and mobilize as able for bowel function.  - NGT high output (NGT does not look like it is in duodenum on last xray) but patient does report he has been drinking ~6 cups of ice/water which could be contributing to this. His output is non-bilious and very thin. Symptomatically improved with no abdominal pain and is having bowel function. NT on exam. Will do NGT clamping trial with possible NGT removal later today.   FEN -  NGT clamped. Sips and chips, IVF per [facility] VTE - SCDs, Lovenox ID - Unasyn for UTI. Does not need any abx coverage from an intra-abdominal standpoint.   - Per [facility] -  Left renal complex cyst  E. Coli UTI - recently seen in ED and dx 6/20 HLD OSA  I reviewed nursing notes, last 24 h vitals and pain scores, last 48 h intake and output, last 24 h labs and trends, and last 24 h imaging results.   LOS: 2 days    Jacinto Halim , [facility] 10/09/2022, 8:01 AM Please see Amion for pager number during day hours 7:00am-4:30pm

## 2022-10-10 ENCOUNTER — Inpatient Hospital Stay (HOSPITAL_COMMUNITY): Payer: Medicare Other

## 2022-10-10 DIAGNOSIS — K56600 Partial intestinal obstruction, unspecified as to cause: Secondary | ICD-10-CM | POA: Diagnosis not present

## 2022-10-10 LAB — BASIC METABOLIC PANEL
Anion gap: 11 (ref 5–15)
BUN: 10 mg/dL (ref 8–23)
CO2: 26 mmol/L (ref 22–32)
Calcium: 8.2 mg/dL — ABNORMAL LOW (ref 8.9–10.3)
Chloride: 100 mmol/L (ref 98–111)
Creatinine, Ser: 0.58 mg/dL — ABNORMAL LOW (ref 0.61–1.24)
GFR, Estimated: >60 mL/min (ref >60)
Glucose, Bld: 126 mg/dL — ABNORMAL HIGH (ref 70–99)
Potassium: 3.6 mmol/L (ref 3.5–5.1)
Sodium: 137 mmol/L (ref 135–145)

## 2022-10-10 LAB — GLUCOSE, CAPILLARY
Glucose-Capillary: 113 mg/dL — ABNORMAL HIGH (ref 70–99)
Glucose-Capillary: 121 mg/dL — ABNORMAL HIGH (ref 70–99)
Glucose-Capillary: 126 mg/dL — ABNORMAL HIGH (ref 70–99)
Glucose-Capillary: 108 mg/dL — ABNORMAL HIGH (ref 70–99)
Glucose-Capillary: 116 mg/dL — ABNORMAL HIGH (ref 70–99)

## 2022-10-10 LAB — MAGNESIUM: Magnesium: 1.8 mg/dL (ref 1.7–2.4)

## 2022-10-10 MED ORDER — MAGNESIUM SULFATE 2 GM/50ML IV SOLN
2.0000 g | Freq: Once | INTRAVENOUS | Status: AC
  Administered 2022-10-10: 2 g via INTRAVENOUS
  Filled 2022-10-10: qty 50

## 2022-10-10 MED ORDER — POTASSIUM CHLORIDE CRYS ER 20 MEQ PO TBCR
40.0000 meq | EXTENDED_RELEASE_TABLET | Freq: Once | ORAL | Status: AC
  Administered 2022-10-10: 40 meq via ORAL
  Filled 2022-10-10: qty 2

## 2022-10-10 NOTE — Progress Notes (Signed)
   10/10/22 2224  BiPAP/CPAP/SIPAP  $ Non-Invasive Home Ventilator  Initial  $ Face Mask Large  Yes  BiPAP/CPAP/SIPAP Pt Type Adult  BiPAP/CPAP/SIPAP DREAMSTATIOND  Mask Type Full face mask (per home regimen)  Mask Size Large  Respiratory Rate 16 breaths/min  FiO2 (%) 21 %  Patient Home Equipment No  Auto Titrate Yes (automode, min4cm, max16cm per pt home settings)  CPAP/SIPAP surface wiped down Yes  BiPAP/CPAP /SiPAP Vitals  Pulse Rate 82  Resp 16  SpO2 93 %

## 2022-10-10 NOTE — Progress Notes (Addendum)
Subjective: CC: Did well with ngt clamping yesterday. Residuals were low and NGT was removed. Tolerated lunch and dinner cld trays well without abdominal pain, n/v. Thinks he overdid it for dinner and developed nausea with vomiting x 3 throughout the night. Nausea and vomiting have now resolved. No abdominal pain today. Passing flatus. BM a few minutes ago. Reports pain medication he is taking is for chronic back pain that he is on percocet at baseline for; he is not taking this for abdominal pain.   Objective: Vital signs in last 24 hours: Temp:  [97.3 F (36.3 C)-98.8 F (37.1 C)] 98.7 F (37.1 C) (06/25 0551) Pulse Rate:  [79-106] 106 (06/25 0551) Resp:  [16-20] 16 (06/25 0551) BP: (157-176)/(67-88) 157/67 (06/25 0551) SpO2:  [89 %-96 %] 89 % (06/25 0551) Last BM Date : 10/09/22  Intake/Output from previous day: 06/24 0701 - 06/25 0700 In: 3937.3 [P.O.:240; I.V.:2824.4; IV Piggyback:872.9] Out: 2325 [Urine:575; Emesis/NG output:1750] Intake/Output this shift: No intake/output data recorded.  PE: Gen:  Alert, NAD, pleasant Abd: Obese, soft, does not appear distended, completely NT, +BS. Small umbilical hernia that is NT, reducible and without overlying skin changes.   Lab Results:  Recent Labs    10/08/22 0356  WBC 7.5  HGB 13.3  HCT 40.7  PLT 310    BMET Recent Labs    10/09/22 0338 10/10/22 0351  NA 137 137  K 3.5 3.6  CL 100 100  CO2 25 26  GLUCOSE 109* 126*  BUN 12 10  CREATININE 0.62 0.58*  CALCIUM 8.0* 8.2*    PT/INR No results for input(s): "LABPROT", "INR" in the last 72 hours. CMP     Component Value Date/Time   NA 137 10/10/2022 0351   K 3.6 10/10/2022 0351   CL 100 10/10/2022 0351   CO2 26 10/10/2022 0351   GLUCOSE 126 (H) 10/10/2022 0351   BUN 10 10/10/2022 0351   CREATININE 0.58 (L) 10/10/2022 0351   CALCIUM 8.2 (L) 10/10/2022 0351   PROT 7.1 10/07/2022 1032   ALBUMIN 3.4 (L) 10/07/2022 1032   AST 19 10/07/2022 1032   ALT 37  10/07/2022 1032   ALKPHOS 56 10/07/2022 1032   BILITOT 0.9 10/07/2022 1032   GFRNONAA >60 10/10/2022 0351   GFRAA >60 04/27/2017 1519   Lipase     Component Value Date/Time   LIPASE 45 10/07/2022 1032    Studies/Results: No results found.  Anti-infectives: Anti-infectives (From admission, onward)    Start     Dose/Rate Route Frequency Ordered Stop   10/09/22 1600  ceFAZolin (ANCEF) IVPB 2g/100 mL premix        2 g 200 mL/hr over 30 Minutes Intravenous Every 8 hours 10/09/22 1008     10/07/22 1500  Ampicillin-Sulbactam (UNASYN) 3 g in sodium chloride 0.9 % 100 mL IVPB  Status:  Discontinued        3 g 200 mL/hr over 30 Minutes Intravenous Every 6 hours 10/07/22 1433 10/09/22 1007   10/07/22 1100  cefTRIAXone (ROCEPHIN) 1 g in sodium chloride 0.9 % 100 mL IVPB  Status:  Discontinued        1 g 200 mL/hr over 30 Minutes Intravenous  Once 10/07/22 1053 10/07/22 1054        Assessment/Plan SBO - CT 6/22 w/ dilated small bowel with tapered area of transition to nondilated small bowel within the mid abdomen. No abrupt transition point.  No prior history of bowel obstructions and has had  no prior abdominal surgery. Per report he has had a recent colonoscopy which was unremarkable.  - Patient without typical risk factors for SBO (no prior abdominal surgeries, abdominopelvic radiation, hernia at point of obstruction, or prior abdominopelvic infectious/inflammatory disorders tx non-op). Agree with Dr. Magnus Ivan that given hx of recent infection this could be related to ileus, however will continue to follow with you to ensure that patient improves and does not need surgery. No current indication for emergency surgery - Contrast in colon on xray 6/23 and having bowel function.  - N/v overnight. He thinks this may be from overdoing CLD. Symptoms have resolved this am, he is having bowel function and exam is reassuring. Will get xray. If any concerning features or he has recurrence of n/v, he  will likely need NGT replaced.  - Keep K > 4, Mg > 2 and mobilize as able for bowel function.  - We will follow with you  FEN - NPO till xray results. IVF per [facility] VTE - SCDs, Lovenox ID - Unasyn for UTI. Does not need any abx coverage from an intra-abdominal standpoint.   - Per [facility] -  Left renal complex cyst  E. Coli UTI - recently seen in ED and dx 6/20 HLD OSA  I reviewed nursing notes, last 24 h vitals and pain scores, last 48 h intake and output, last 24 h labs and trends, and last 24 h imaging results.   LOS: 3 days    Jacinto Halim , [facility] 10/10/2022, 10:33 AM Please see Amion for pager number during day hours 7:00am-4:30pm

## 2022-10-10 NOTE — Plan of Care (Signed)

## 2022-10-10 NOTE — Care Management Important Message (Signed)
Important Message  Patient Details IM Letter given. Name: [name] MRN: [id number] Date of Birth: 08-Aug-1955   Medicare Important Message Given:  Yes     Caren Macadam 10/10/2022, 10:48 AM

## 2022-10-10 NOTE — Plan of Care (Signed)
  Problem: Education: Goal: Knowledge of General Education information will improve Description: Including pain rating scale, medication(s)/side effects and non-pharmacologic comfort measures 10/10/2022 0812 by Kerby Nora, RN Outcome: Progressing 10/10/2022 0812 by Kerby Nora, RN Outcome: Progressing   Problem: Health Behavior/Discharge Planning: Goal: Ability to manage health-related needs will improve 10/10/2022 0812 by Kerby Nora, RN Outcome: Progressing 10/10/2022 0812 by Kerby Nora, RN Outcome: Progressing   Problem: Clinical Measurements: Goal: Ability to maintain clinical measurements within normal limits will improve 10/10/2022 0812 by Kerby Nora, RN Outcome: Progressing 10/10/2022 0812 by Kerby Nora, RN Outcome: Progressing Goal: Will remain free from infection 10/10/2022 0812 by Kerby Nora, RN Outcome: Progressing 10/10/2022 0812 by Kerby Nora, RN Outcome: Progressing Goal: Diagnostic test results will improve 10/10/2022 0812 by Kerby Nora, RN Outcome: Progressing 10/10/2022 0812 by Kerby Nora, RN Outcome: Progressing Goal: Respiratory complications will improve 10/10/2022 0812 by Kerby Nora, RN Outcome: Progressing 10/10/2022 0812 by Kerby Nora, RN Outcome: Progressing Goal: Cardiovascular complication will be avoided 10/10/2022 1610 by Kerby Nora, RN Outcome: Progressing 10/10/2022 0812 by Kerby Nora, RN Outcome: Progressing   Problem: Activity: Goal: Risk for activity intolerance will decrease 10/10/2022 [id number] by Kerby Nora, RN Outcome: Progressing 10/10/2022 0812 by Kerby Nora, RN Outcome: Progressing   Problem: Nutrition: Goal: Adequate nutrition will be maintained 10/10/2022 [id number] by Kerby Nora, RN Outcome: Progressing 10/10/2022 0812 by Kerby Nora, RN Outcome: Progressing   Problem: Coping: Goal: Level of anxiety will decrease 10/10/2022 0812 by Kerby Nora, RN Outcome:  Progressing 10/10/2022 0812 by Kerby Nora, RN Outcome: Progressing   Problem: Elimination: Goal: Will not experience complications related to bowel motility 10/10/2022 0812 by Kerby Nora, RN Outcome: Progressing 10/10/2022 0812 by Kerby Nora, RN Outcome: Progressing Goal: Will not experience complications related to urinary retention 10/10/2022 0812 by Kerby Nora, RN Outcome: Progressing 10/10/2022 0812 by Kerby Nora, RN Outcome: Progressing   Problem: Pain Managment: Goal: General experience of comfort will improve 10/10/2022 0812 by Kerby Nora, RN Outcome: Progressing 10/10/2022 0812 by Kerby Nora, RN Outcome: Progressing   Problem: Safety: Goal: Ability to remain free from injury will improve 10/10/2022 0812 by Kerby Nora, RN Outcome: Progressing 10/10/2022 0812 by Kerby Nora, RN Outcome: Progressing   Problem: Skin Integrity: Goal: Risk for impaired skin integrity will decrease 10/10/2022 0812 by Kerby Nora, RN Outcome: Progressing 10/10/2022 0812 by Kerby Nora, RN Outcome: Progressing

## 2022-10-10 NOTE — Progress Notes (Signed)
PROGRESS NOTE    [name]  ZOX:[id number] DOB: [birth year] DOA: 10/07/2022 PCP: [name], MD    Brief Narrative:   [name] is a 67 y.o. male with past medical history significant for HTN, HLD, OSA, chronic low back pain, chronic neck pain s/p decompressive cervical laminectomy with fusion 2019 who presented to [facility] ED on 6/22 with nausea, vomiting, abdominal pain.  Ongoing since Tuesday with last bowel movement Wednesday.  Reports passing flatus but unable to tolerate p.o. intake since Tuesday.  No previous history of bowel obstructions with no prior abdominal surgery past.  Recently seen in the ED with a urinary tract infection with culture positive for E. coli and currently on antibiotics.  In the ED, temperature 98.1 F, HR 88, RR 18, BP 147/76, SpO2 95% on room air.  WBC 6.0, hemoglobin 13.8, platelets 332.  Sodium 127, potassium 3.3, chloride 86, CO2 29, glucose 107, BUN 15, creatinine 0.77.  Lipase 45.  AST 19, ALT 37, total bilirubin 0.9.  Lactic acid 1.1.  Urinalysis unrevealing.  UDS positive for opiates.  CT Abdo/pelvis with multiple dilated loops of small bowel within the abdomen measuring up to 5.1 cm with transition in the mid abdomen consistent with partial small bowel obstruction, indeterminate 3.9 cm exophytic lesion anterior aspect upper pole right kidney colonic diverticulosis without acute diverticulitis.  General surgery was consulted.  NG tube was placed to wall suction.  TRH consulted for admission for further evaluation management of small bowel obstruction.  Assessment & Plan:   Partial small bowel obstruction versus ileus Patient presenting with abdominal pain associate with nausea and vomiting and inability to tolerate oral intake.  Patient was afebrile without leukocytosis.  Urinalysis unrevealing.  CT abdomen/pelvis with findings consistent with small bowel obstruction with transition point in the mid abdomen. -- General surgery  following, appreciate assistance -- NG tube discontinued yesterday, started on clear liquid diet but with vomiting overnight, repeat KUB this morning with single dilated small bowel loop consistent with resolving SBO. -- Continue clear liquid diet, if does not tolerate with recurrent vomiting today may need replacement of NG tube  Left renal complex cyst Incidental finding of 3.9 cm exophytic lesion anterior aspect upper pole right kidney.  Renal ultrasound with complex cystic structure upper pole left kidney measuring 3 cm compatible with a complex renal cyst, simple appearing cyst upper pole right kidney 1.7 cm. -- Outpatient abdominal MRI  Hyponatremia Sodium 127 admission, likely secondary to hypovolemic hyponatremia in the setting of dehydration secondary to bowel obstruction with GI loss from nausea/vomiting. -- Na 811>914>782 -- IVF w/ NS at 113mL/h -- BMP in am  E. coli UTI Recently seen in the ED, diagnosed on 6/20. -- Continue IV cefazolin, to complete 3-day course  Essential hypertension Home regimen includes amlodipine 10 mg p.o. daily, losartan-HCTZ 100-25 mg p.o. daily -- Hold oral hypertensives while NPO -- Metoprolol tartrate 5 mg IV every 6 hours -- Hydralazine 10 mg IV every 4 hours as needed SBP greater than 170  Hyperlipidemia -- Holding home atorvastatin  OSA -- CPAP qHS  Morbid obesity Body mass index is 44.07 kg/m.  Discussed with patient needs for aggressive lifestyle changes/weight loss as this complicates all facets of care.  Outpatient follow-up with PCP.    DVT prophylaxis: Lovenox    Code Status: Full Code Family Communication: No family present at bedside  Disposition Plan:  Level of care: Med-Surg Status is: Inpatient Remains inpatient appropriate because: Clear liquid  diet, needs further advancement of diet with toleration and general surgery signed off before stable for discharge home    Consultants:  General surgery  Procedures:   None  Antimicrobials:  Unasyn 6/22 - 6/24 Cefazolin 6/24>>   Subjective: Patient seen examined bedside, resting calmly.  Lying in bed.  Spouse present.  Started on clear liquid diet yesterday with NG tube removal.  Was initially tolerating but multiple episodes of vomiting overnight.  Reports flatus and bowel movement.  Discussed with general surgery PA this morning, repeated abdominal x-ray with noted single loop of small bowel dilation consistent with resolving SBO.  Will continue clear liquid diet for now but if recurrence of vomiting may need replacement of NG tube.   No other specific complaints or concerns at this time.  Denies headache, no dizziness, no chest pain, no palpitations, no shortness of breath, no abdominal pain, no fever/chills/night sweats, no nausea cefonicid diarrhea, no focal weakness, no fatigue, no paresthesias.  No acute events overnight per nursing staff.  Objective: Vitals:   10/09/22 1742 10/09/22 1939 10/09/22 2351 10/10/22 0551  BP: (!) 164/73 (!) 159/73 (!) 170/88 (!) 157/67  Pulse: 81 79 90 (!) 106  Resp: 20 18 16 16   Temp: 98.7 F (37.1 C) 98.8 F (37.1 C) (!) 97.3 F (36.3 C) 98.7 F (37.1 C)  TempSrc: Oral Oral    SpO2: 96% 94% 90% (!) 89%  Weight:      Height:        Intake/Output Summary (Last 24 hours) at 10/10/2022 1156 Last data filed at 10/10/2022 0328 Gross per 24 hour  Intake 2568.67 ml  Output 2025 ml  Net 543.67 ml   Filed Weights   10/07/22 1009  Weight: (!) 143.3 kg    Examination:  Physical Exam: GEN: NAD, alert and oriented x 3, morbid obesity HEENT: NCAT, PERRL, EOMI, sclera clear, MMM PULM: CTAB w/o wheezes/crackles, normal respiratory effort, on room air CV: RRR w/o M/G/R GI: abd soft, NTND, faint bowel sounds appreciated MSK: no peripheral edema, moves all extremities independently NEURO: CN II-XII intact, no focal deficits, sensation to light touch intact PSYCH: normal mood/affect Integumentary: dry/intact, no  rashes or wounds    Data Reviewed: I have personally reviewed following labs and imaging studies  CBC: Recent Labs  Lab 10/05/22 1120 10/07/22 1032 10/08/22 0356  WBC 7.0 6.0 7.5  NEUTROABS  --  3.6  --   HGB 13.8 13.8 13.3  HCT 41.3 41.4 40.7  MCV 85.9 85.5 88.5  PLT 310 332 310   Basic Metabolic Panel: Recent Labs  Lab 10/05/22 1120 10/07/22 1032 10/08/22 0356 10/09/22 0338 10/10/22 0351  NA 131* 127* 132* 137 137  K 3.9 3.3* 3.3* 3.5 3.6  CL 92* 86* 97* 100 100  CO2 28 29 26 25 26   GLUCOSE 118* 107* 102* 109* 126*  BUN 19 15 11 12 10   CREATININE 0.72 0.77 0.62 0.62 0.58*  CALCIUM 9.6 8.6* 7.8* 8.0* 8.2*  MG  --  1.9 2.1 2.1 1.8   GFR: Estimated Creatinine Clearance: 128.1 mL/min (A) (by C-G formula based on SCr of 0.58 mg/dL (L)). Liver Function Tests: Recent Labs  Lab 10/05/22 1120 10/07/22 1032  AST 28 19  ALT 43 37  ALKPHOS 60 56  BILITOT 0.7 0.9  PROT 6.7 7.1  ALBUMIN 3.9 3.4*   Recent Labs  Lab 10/05/22 1120 10/07/22 1032  LIPASE 32 45   No results for input(s): "AMMONIA" in the last 168 hours. Coagulation  Profile: No results for input(s): "INR", "PROTIME" in the last 168 hours. Cardiac Enzymes: No results for input(s): "CKTOTAL", "CKMB", "CKMBINDEX", "TROPONINI" in the last 168 hours. BNP (last 3 results) No results for input(s): "PROBNP" in the last 8760 hours. HbA1C: No results for input(s): "HGBA1C" in the last 72 hours. CBG: Recent Labs  Lab 10/09/22 1610 10/09/22 2109 10/10/22 0059 10/10/22 0350 10/10/22 0813  GLUCAP 104* 141* 113* 121* 126*   Lipid Profile: No results for input(s): "CHOL", "HDL", "LDLCALC", "TRIG", "CHOLHDL", "LDLDIRECT" in the last 72 hours. Thyroid Function Tests: No results for input(s): "TSH", "T4TOTAL", "FREET4", "T3FREE", "THYROIDAB" in the last 72 hours. Anemia Panel: No results for input(s): "VITAMINB12", "FOLATE", "FERRITIN", "TIBC", "IRON", "RETICCTPCT" in the last 72 hours. Sepsis  Labs: Recent Labs  Lab 10/07/22 1234  LATICACIDVEN 1.1    Recent Results (from the past 240 hour(s))  Urine Culture     Status: Abnormal   Collection Time: 10/05/22 11:24 AM   Specimen: Urine, Clean Catch  Result Value Ref Range Status   Specimen Description   Final    URINE, CLEAN CATCH Performed at [facility], [address] [place], [place] [address]    Special Requests   Final    NONE Performed at [facility], [address] [place], [place] [address]    Culture >=100,000 COLONIES/mL ESCHERICHIA COLI (A)  Final   Report Status 10/07/2022 FINAL  Final   Organism ID, Bacteria ESCHERICHIA COLI (A)  Final      Susceptibility   Escherichia coli - MIC*    AMPICILLIN 4 SENSITIVE Sensitive     CEFAZOLIN <=4 SENSITIVE Sensitive     CEFEPIME <=0.12 SENSITIVE Sensitive     CEFTRIAXONE <=0.25 SENSITIVE Sensitive     CIPROFLOXACIN <=0.25 SENSITIVE Sensitive     GENTAMICIN <=1 SENSITIVE Sensitive     IMIPENEM <=0.25 SENSITIVE Sensitive     NITROFURANTOIN <=16 SENSITIVE Sensitive     TRIMETH/SULFA <=20 SENSITIVE Sensitive     AMPICILLIN/SULBACTAM <=2 SENSITIVE Sensitive     PIP/TAZO <=4 SENSITIVE Sensitive     * >=100,000 COLONIES/mL ESCHERICHIA COLI         Radiology Studies: DG Abd Portable 1V  Result Date: 10/10/2022 CLINICAL DATA:  Vomiting. EXAM: PORTABLE ABDOMEN - 1 VIEW COMPARISON:  Radiographs 10/08/2022 and 10/07/2022. Abdominal CT 10/07/2022. FINDINGS: Two supine views of the abdomen are submitted. Portions of the pelvis are excluded. Enteric tube is no longer visualized. A small amount of residual contrast material is present within the colon which is not distended. There is a single mildly dilated loop of small bowel in the left mid abdomen. No supine evidence of free intraperitoneal air. No suspicious abdominal calcifications. Degenerative changes of the spine, previous lower lumbar fusion and previous hip replacements are partially imaged.  IMPRESSION: Single mildly dilated loop of small bowel in the left mid abdomen is nonspecific but may represent a resolving partial small bowel obstruction based on recent prior imaging. No new findings identified. Electronically Signed   By: Carey Bullocks M.D.   On: 10/10/2022 10:59        Scheduled Meds:  enoxaparin (LOVENOX) injection  70 mg Subcutaneous QHS   metoprolol tartrate  5 mg Intravenous Q6H   pantoprazole (PROTONIX) IV  40 mg Intravenous Q24H   Continuous Infusions:  sodium chloride 100 mL/hr at 10/10/22 0725    ceFAZolin (ANCEF) IV 2 g (10/10/22 0556)   methocarbamol (ROBAXIN) IV Stopped (10/09/22 2310)  LOS: 3 days    Time spent: 50 minutes spent on chart review, discussion with nursing staff, consultants, updating family and interview/physical exam; more than 50% of that time was spent in counseling and/or coordination of care.    Alvira Philips [place], DO [facility] Available via Epic secure chat 7am-7pm After these hours, please refer to coverage provider listed on [email or web address] 10/10/2022, 11:56 AM

## 2022-10-10 NOTE — Progress Notes (Signed)
Mobility Specialist - Progress Note   10/10/22 1035  Mobility  Activity Ambulated with assistance in hallway  Level of Assistance Standby assist, set-up cues, supervision of patient - no hands on  Assistive Device Front wheel walker  Distance Ambulated (ft) 80 ft  Activity Response Tolerated well  Mobility Referral Yes  $Mobility charge 1 Mobility  Mobility Specialist Start Time (ACUTE ONLY) 1024  Mobility Specialist Stop Time (ACUTE ONLY) 1034  Mobility Specialist Time Calculation (min) (ACUTE ONLY) 10 min   Pt received EOB and agreeable to mobility. No complaints during session. Pt to recliner after session with all needs met.    [facility]

## 2022-10-11 DIAGNOSIS — K56600 Partial intestinal obstruction, unspecified as to cause: Secondary | ICD-10-CM | POA: Diagnosis not present

## 2022-10-11 LAB — CBC
HCT: 40.5 % (ref 39.0–52.0)
Hemoglobin: 13.1 g/dL (ref 13.0–17.0)
MCH: 29 pg (ref 26.0–34.0)
MCHC: 32.3 g/dL (ref 30.0–36.0)
MCV: 89.8 fL (ref 80.0–100.0)
Platelets: 289 10*3/uL (ref 150–400)
RBC: 4.51 MIL/uL (ref 4.22–5.81)
RDW: 13.7 % (ref 11.5–15.5)
WBC: 10.1 10*3/uL (ref 4.0–10.5)
nRBC: 0 % (ref 0.0–0.2)

## 2022-10-11 LAB — COMPREHENSIVE METABOLIC PANEL
ALT: 60 U/L — ABNORMAL HIGH (ref 0–44)
AST: 42 U/L — ABNORMAL HIGH (ref 15–41)
Albumin: 2.8 g/dL — ABNORMAL LOW (ref 3.5–5.0)
Alkaline Phosphatase: 49 U/L (ref 38–126)
Anion gap: 7 (ref 5–15)
BUN: 10 mg/dL (ref 8–23)
CO2: 27 mmol/L (ref 22–32)
Calcium: 7.8 mg/dL — ABNORMAL LOW (ref 8.9–10.3)
Chloride: 99 mmol/L (ref 98–111)
Creatinine, Ser: 0.58 mg/dL — ABNORMAL LOW (ref 0.61–1.24)
GFR, Estimated: >60 mL/min (ref >60)
Glucose, Bld: 115 mg/dL — ABNORMAL HIGH (ref 70–99)
Potassium: 3.5 mmol/L (ref 3.5–5.1)
Sodium: 133 mmol/L — ABNORMAL LOW (ref 135–145)
Total Bilirubin: 0.6 mg/dL (ref 0.3–1.2)
Total Protein: 5.8 g/dL — ABNORMAL LOW (ref 6.5–8.1)

## 2022-10-11 LAB — MAGNESIUM: Magnesium: 2 mg/dL (ref 1.7–2.4)

## 2022-10-11 MED ORDER — CEFADROXIL 500 MG PO CAPS: 500.0000 mg | ORAL_CAPSULE | Freq: Two times a day (BID) | ORAL | 0 refills | Status: AC

## 2022-10-11 NOTE — Plan of Care (Signed)
  Problem: Education: Goal: Knowledge of General Education information will improve Description: Including pain rating scale, medication(s)/side effects and non-pharmacologic comfort measures 10/11/2022 1724 by Kerby Nora, RN Outcome: Adequate for Discharge 10/11/2022 0751 by Kerby Nora, RN Outcome: Progressing   Problem: Health Behavior/Discharge Planning: Goal: Ability to manage health-related needs will improve 10/11/2022 1724 by Kerby Nora, RN Outcome: Adequate for Discharge 10/11/2022 0751 by Kerby Nora, RN Outcome: Progressing   Problem: Clinical Measurements: Goal: Ability to maintain clinical measurements within normal limits will improve 10/11/2022 1724 by Kerby Nora, RN Outcome: Adequate for Discharge 10/11/2022 0751 by Kerby Nora, RN Outcome: Progressing Goal: Will remain free from infection 10/11/2022 1724 by Kerby Nora, RN Outcome: Adequate for Discharge 10/11/2022 0751 by Kerby Nora, RN Outcome: Progressing Goal: Diagnostic test results will improve 10/11/2022 1724 by Kerby Nora, RN Outcome: Adequate for Discharge 10/11/2022 0751 by Kerby Nora, RN Outcome: Progressing Goal: Respiratory complications will improve 10/11/2022 1724 by Kerby Nora, RN Outcome: Adequate for Discharge 10/11/2022 0751 by Kerby Nora, RN Outcome: Progressing Goal: Cardiovascular complication will be avoided 10/11/2022 1724 by Kerby Nora, RN Outcome: Adequate for Discharge 10/11/2022 0751 by Kerby Nora, RN Outcome: Progressing   Problem: Activity: Goal: Risk for activity intolerance will decrease 10/11/2022 1724 by Kerby Nora, RN Outcome: Adequate for Discharge 10/11/2022 0751 by Kerby Nora, RN Outcome: Progressing   Problem: Nutrition: Goal: Adequate nutrition will be maintained 10/11/2022 1724 by Kerby Nora, RN Outcome: Adequate for Discharge 10/11/2022 0751 by Kerby Nora, RN Outcome: Progressing   Problem:  Coping: Goal: Level of anxiety will decrease 10/11/2022 1724 by Kerby Nora, RN Outcome: Adequate for Discharge 10/11/2022 0751 by Kerby Nora, RN Outcome: Progressing   Problem: Elimination: Goal: Will not experience complications related to bowel motility 10/11/2022 1724 by Kerby Nora, RN Outcome: Adequate for Discharge 10/11/2022 0751 by Kerby Nora, RN Outcome: Progressing Goal: Will not experience complications related to urinary retention 10/11/2022 1724 by Kerby Nora, RN Outcome: Adequate for Discharge 10/11/2022 0751 by Kerby Nora, RN Outcome: Progressing   Problem: Pain Managment: Goal: General experience of comfort will improve 10/11/2022 1724 by Kerby Nora, RN Outcome: Adequate for Discharge 10/11/2022 0751 by Kerby Nora, RN Outcome: Progressing   Problem: Safety: Goal: Ability to remain free from injury will improve 10/11/2022 1724 by Kerby Nora, RN Outcome: Adequate for Discharge 10/11/2022 0751 by Kerby Nora, RN Outcome: Progressing   Problem: Skin Integrity: Goal: Risk for impaired skin integrity will decrease 10/11/2022 1724 by Kerby Nora, RN Outcome: Adequate for Discharge 10/11/2022 0751 by Kerby Nora, RN Outcome: Progressing   Problem: Inadequate Intake (NI-2.1) Goal: Food and/or nutrient delivery Description: Individualized approach for food/nutrient provision. Outcome: Adequate for Discharge

## 2022-10-11 NOTE — Progress Notes (Addendum)
Subjective: CC: Tolerating FLD without abdominal pain, n/v. Passing flatus. BM x 2 yesterday.  Afebrile. No tachycardia or hypotension. WBC 10.1   As previously noted in notes - reports pain medication he is taking is for chronic back pain that he is on percocet at baseline for; he is not taking this for abdominal pain. Appears he is only taking Zofran at the exact time he is taking Morphine.  Objective: Vital signs in last 24 hours: Temp:  [98.1 F (36.7 C)-98.4 F (36.9 C)] 98.1 F (36.7 C) (06/26 0411) Pulse Rate:  [82-93] 93 (06/26 0411) Resp:  [16-18] 18 (06/26 0411) BP: (141-165)/(71-86) 150/82 (06/26 0411) SpO2:  [91 %-95 %] 91 % (06/26 0411) FiO2 (%):  [21 %] 21 % (06/25 2224) Last BM Date : 10/10/22  Intake/Output from previous day: 06/25 0701 - 06/26 0700 In: 924.6 [I.V.:620.4; IV Piggyback:304.2] Out: -  Intake/Output this shift: No intake/output data recorded.  PE: Gen:  Alert, NAD, pleasant Abd: Obese, soft, does not appear distended, completely NT, +BS. Small umbilical hernia that is NT, reducible and without overlying skin changes.   Lab Results:  Recent Labs    10/11/22 0404  WBC 10.1  HGB 13.1  HCT 40.5  PLT 289    BMET Recent Labs    10/10/22 0351 10/11/22 0404  NA 137 133*  K 3.6 3.5  CL 100 99  CO2 26 27  GLUCOSE 126* 115*  BUN 10 10  CREATININE 0.58* 0.58*  CALCIUM 8.2* 7.8*    PT/INR No results for input(s): "LABPROT", "INR" in the last 72 hours. CMP     Component Value Date/Time   NA 133 (L) 10/11/2022 0404   K 3.5 10/11/2022 0404   CL 99 10/11/2022 0404   CO2 27 10/11/2022 0404   GLUCOSE 115 (H) 10/11/2022 0404   BUN 10 10/11/2022 0404   CREATININE 0.58 (L) 10/11/2022 0404   CALCIUM 7.8 (L) 10/11/2022 0404   PROT 5.8 (L) 10/11/2022 0404   ALBUMIN 2.8 (L) 10/11/2022 0404   AST 42 (H) 10/11/2022 0404   ALT 60 (H) 10/11/2022 0404   ALKPHOS 49 10/11/2022 0404   BILITOT 0.6 10/11/2022 0404   GFRNONAA >60  10/11/2022 0404   GFRAA >60 04/27/2017 1519   Lipase     Component Value Date/Time   LIPASE 45 10/07/2022 1032    Studies/Results: DG Abd Portable 1V  Result Date: 10/10/2022 CLINICAL DATA:  Vomiting. EXAM: PORTABLE ABDOMEN - 1 VIEW COMPARISON:  Radiographs 10/08/2022 and 10/07/2022. Abdominal CT 10/07/2022. FINDINGS: Two supine views of the abdomen are submitted. Portions of the pelvis are excluded. Enteric tube is no longer visualized. A small amount of residual contrast material is present within the colon which is not distended. There is a single mildly dilated loop of small bowel in the left mid abdomen. No supine evidence of free intraperitoneal air. No suspicious abdominal calcifications. Degenerative changes of the spine, previous lower lumbar fusion and previous hip replacements are partially imaged. IMPRESSION: Single mildly dilated loop of small bowel in the left mid abdomen is nonspecific but may represent a resolving partial small bowel obstruction based on recent prior imaging. No new findings identified. Electronically Signed   By: Carey Bullocks M.D.   On: 10/10/2022 10:59    Anti-infectives: Anti-infectives (From admission, onward)    Start     Dose/Rate Route Frequency Ordered Stop   10/09/22 1600  ceFAZolin (ANCEF) IVPB 2g/100 mL premix  2 g 200 mL/hr over 30 Minutes Intravenous Every 8 hours 10/09/22 1008 10/11/22 0644   10/07/22 1500  Ampicillin-Sulbactam (UNASYN) 3 g in sodium chloride 0.9 % 100 mL IVPB  Status:  Discontinued        3 g 200 mL/hr over 30 Minutes Intravenous Every 6 hours 10/07/22 1433 10/09/22 1007   10/07/22 1100  cefTRIAXone (ROCEPHIN) 1 g in sodium chloride 0.9 % 100 mL IVPB  Status:  Discontinued        1 g 200 mL/hr over 30 Minutes Intravenous  Once 10/07/22 1053 10/07/22 1054        Assessment/Plan SBO - CT 6/22 w/ dilated small bowel with tapered area of transition to nondilated small bowel within the mid abdomen. No abrupt  transition point.  No prior history of bowel obstructions and has had no prior abdominal surgery. Per report he has had a recent colonoscopy which was unremarkable.  - Patient without typical risk factors for SBO (no prior abdominal surgeries, abdominopelvic radiation, hernia at point of obstruction, or prior abdominopelvic infectious/inflammatory disorders tx non-op). Agree with Dr. Magnus Ivan that given hx of recent infection this could be related to ileus, however will continue to follow with you to ensure that patient improves and does not need surgery. No current indication for emergency surgery - Contrast in colon on xray 6/23 and having bowel function.  - Tolerating FLD. Adv to soft diet. If tolerates without recurrence of symptoms (abdominal pain, nausea or vomiting), he is okay for discharge from our standpoint. Will let [facility] know.   FEN - Soft diet. IVF per TRH VTE - SCDs, Lovenox ID - Completed Unasyn for UTI. None currently. Does not need any abx coverage from an intra-abdominal standpoint.   - Per [facility] -  Left renal complex cyst  E. Coli UTI - recently seen in ED and dx 6/20 HLD OSA  I reviewed nursing notes, last 24 h vitals and pain scores, last 48 h intake and output, last 24 h labs and trends, and last 24 h imaging results.   LOS: 4 days    Jacinto Halim , [facility] 10/11/2022, 11:10 AM Please see Amion for pager number during day hours 7:00am-4:30pm

## 2022-10-11 NOTE — Discharge Summary (Signed)
Physician Discharge Summary  [name] MVH:[id number] DOB: Apr 18, 1955 DOA: 10/07/2022  PCP: [name], MD  Admit date: 10/07/2022 Discharge date: 10/11/2022  Time spent: 40 minutes  Recommendations for Outpatient Follow-up:  Follow outpatient CBC/CMP  Follow  complex renal cyst outpatient (needs MRI) Complete abx for UTI outpatient Follow mildly elevated LFT's outpatient  Discharge Diagnoses:  Principal Problem:   Partial small bowel obstruction [facility])   Discharge Condition: stable  Diet recommendation: heart healthy   Filed Weights   10/07/22 1009  Weight: (!) 143.3 kg    History of present illness:   [name] is [name] 67 y.o. male with past medical history significant for HTN, HLD, OSA, chronic low back pain, chronic neck pain s/p decompressive cervical laminectomy with fusion 2019 who presented to [facility] ED on 6/22 with nausea, vomiting, abdominal pain.  Found to have CT findings concerning for SBO.  Improved after conservative management with NG tube.  Stable for discharge on 6/26.    See below for additional details  Hospital Course:  Assessment and Plan:  Partial small bowel obstruction versus ileus Patient presenting with abdominal pain associate with nausea and vomiting and inability to tolerate oral intake.  Patient was afebrile without leukocytosis.  Urinalysis unrevealing.  CT abdomen/pelvis with findings consistent with small bowel obstruction with transition point in the mid abdomen. -- s/p conservative management with NG tube -- General surgery following, appreciate assistance - possible ileus related to infection? -- tolerating soft diet today, surgery ok with discharge   Left renal complex cyst Incidental finding of 3.9 cm exophytic lesion anterior aspect upper pole right kidney.  Renal ultrasound with complex cystic structure upper pole left kidney measuring 3 cm compatible with [name] complex renal cyst, simple appearing cyst upper  pole right kidney 1.7 cm. -- Outpatient abdominal MRI   Hyponatremia Mild, follow outpatient   E. coli UTI Will discharge with duricef to complete 7 day course   Essential hypertension Home regimen includes amlodipine 10 mg p.o. daily, losartan-HCTZ 100-25 mg p.o. daily, metoprolol 150 mg daily    Hyperlipidemia -- Holding home atorvastatin   OSA -- CPAP qHS   Morbid obesity Body mass index is 44.07 kg/m.  Discussed with patient needs for aggressive lifestyle changes/weight loss as this complicates all facets of care.  Outpatient follow-up with PCP.      Procedures:  none  Consultations: surgery  Discharge Exam: Vitals:   10/11/22 0411 10/11/22 1334  BP: (!) 150/82 (!) 157/74  Pulse: 93 (!) 50  Resp: 18 18  Temp: 98.1 F (36.7 C) (!) 97.5 F (36.4 C)  SpO2: 91% 91%   No complaints  General: No acute distress. Cardiovascular: RRR Lungs: unlabored Abdomen: Soft, nontender, nondistended Neurological: Alert and oriented 3. Moves all extremities 4 with equal strength. Cranial nerves II through XII grossly intact. Extremities: No clubbing or cyanosis. No edema.   Discharge Instructions   Discharge Instructions     Call MD for:  difficulty breathing, headache or visual disturbances   Complete by: As directed    Call MD for:  difficulty breathing, headache or visual disturbances   Complete by: As directed    Call MD for:  extreme fatigue   Complete by: As directed    Call MD for:  extreme fatigue   Complete by: As directed    Call MD for:  hives   Complete by: As directed    Call MD for:  hives   Complete by: As  directed    Call MD for:  persistant dizziness or light-headedness   Complete by: As directed    Call MD for:  persistant dizziness or light-headedness   Complete by: As directed    Call MD for:  persistant nausea and vomiting   Complete by: As directed    Call MD for:  persistant nausea and vomiting   Complete by: As directed    Call MD  for:  redness, tenderness, or signs of infection (pain, swelling, redness, odor or green/yellow discharge around incision site)   Complete by: As directed    Call MD for:  redness, tenderness, or signs of infection (pain, swelling, redness, odor or green/yellow discharge around incision site)   Complete by: As directed    Call MD for:  severe uncontrolled pain   Complete by: As directed    Call MD for:  severe uncontrolled pain   Complete by: As directed    Call MD for:  temperature >100.4   Complete by: As directed    Call MD for:  temperature >100.4   Complete by: As directed    Diet - low sodium heart healthy   Complete by: As directed    Diet - low sodium heart healthy   Complete by: As directed    Discharge instructions   Complete by: As directed    You were seen for Lamar Meter small bowel obstruction.  You've improved with supportive care.  You were seen by general surgery and given your improvement, will discharge you with [name] plan for outpatient follow up.   You've had [name] partial course for your urinary tract infection (UTI).  I'll send you with another 3 days of antibiotics to finish [name] 7 day course.  Take this with food.    You had [name] complex renal cyst seen on imaging and will need [name] renal MRI as an outpatient.  Follow this with your PCP.  Your liver functions are mildly elevated, follow up with your PCP to repeat this outpatient.   Return for new, recurrent, or worsening symptoms.  Please ask your PCP to request records from this hospitalization so they know what was done and what the next steps will be.   Increase activity slowly   Complete by: As directed    Increase activity slowly   Complete by: As directed       Allergies as of 10/11/2022       Reactions   Cephalexin Nausea And Vomiting        Medication List     STOP taking these medications    celecoxib 200 MG capsule Commonly known as: CELEBREX   cephALEXin 500 MG capsule Commonly known as: KEFLEX        TAKE these medications    ALPRAZolam 0.5 MG tablet Commonly known as: XANAX Take 1 tablet (0.5 mg total) by mouth as needed for anxiety (for sedation before MRI scan; take 1 hour before scan; may repeat 15 min before scan).   amLODipine 10 MG tablet Commonly known as: NORVASC Take 10 mg by mouth daily.   aspirin EC 325 MG tablet Take 325 mg by mouth daily.   atorvastatin 40 MG tablet Commonly known as: LIPITOR Take 40 mg by mouth every morning.   cefadroxil 500 MG capsule Commonly known as: DURICEF Take 1 capsule (500 mg total) by mouth 2 (two) times daily for 3 days.   Cholecalciferol 50 MCG (2000 UT) Caps Take 2,000 Units by mouth daily. Takes 2 tablets  CoQ10 100 MG Caps Take 200 mg by mouth daily.   diphenhydramine-acetaminophen 25-500 MG Tabs tablet Commonly known as: TYLENOL PM Take 2 tablets by mouth at bedtime.   losartan-hydrochlorothiazide 100-25 MG tablet Commonly known as: HYZAAR Take 1 tablet by mouth daily.   methocarbamol 750 MG tablet Commonly known as: ROBAXIN Take 325 mg by mouth every 8 (eight) hours. Takes 0.5 tablet   metoprolol succinate 100 MG 24 hr tablet Commonly known as: TOPROL-XL Take 150 mg by mouth daily.   multivitamins ther. w/minerals Tabs tablet Take 1 tablet by mouth daily.   ondansetron 4 MG tablet Commonly known as: ZOFRAN Take 1 tablet (4 mg total) by mouth every 8 (eight) hours as needed for nausea or vomiting.   oxyCODONE-acetaminophen 10-325 MG tablet Commonly known as: PERCOCET Take 1 tablet by mouth every 4 (four) hours as needed for pain.   oxymetazoline 0.05 % nasal spray Commonly known as: AFRIN Place 1 spray into both nostrils at bedtime as needed for congestion.   sildenafil 100 MG tablet Commonly known as: VIAGRA Take 100 mg by mouth daily as needed for erectile dysfunction.       Allergies  Allergen Reactions   Cephalexin Nausea And Vomiting      The results of significant diagnostics from  this hospitalization (including imaging, microbiology, ancillary and laboratory) are listed below for reference.    Significant Diagnostic Studies: DG Abd Portable 1V  Result Date: 10/10/2022 CLINICAL DATA:  Vomiting. EXAM: PORTABLE ABDOMEN - 1 VIEW COMPARISON:  Radiographs 10/08/2022 and 10/07/2022. Abdominal CT 10/07/2022. FINDINGS: Two supine views of the abdomen are submitted. Portions of the pelvis are excluded. Enteric tube is no longer visualized. Ramello Cordial small amount of residual contrast material is present within the colon which is not distended. There is Nonie Lochner single mildly dilated loop of small bowel in the left mid abdomen. No supine evidence of free intraperitoneal air. No suspicious abdominal calcifications. Degenerative changes of the spine, previous lower lumbar fusion and previous hip replacements are partially imaged. IMPRESSION: Single mildly dilated loop of small bowel in the left mid abdomen is nonspecific but may represent Danni Shima resolving partial small bowel obstruction based on recent prior imaging. No new findings identified. Electronically Signed   By: Carey Bullocks M.D.   On: 10/10/2022 10:59   DG Abd Portable 1V-Small Bowel Obstruction Protocol-initial, 8 hr delay  Result Date: 10/08/2022 CLINICAL DATA:  8 hour delay of small bowel obstruction EXAM: PORTABLE ABDOMEN - 1 VIEW COMPARISON:  Yesterday FINDINGS: Enteric tube with tip at the stomach and side port near the GE junction. Enteric contrast is within nondilated colon. There is still Kailan Laws distended loop of small bowel in the left abdomen. No concerning mass effect or calcification.  Lung bases are clear. IMPRESSION: 1. Bowel obstruction is partial with enteric contrast reaching the nondilated colon. Persisting loop of dilated small bowel in the left abdomen. 2. Enteric tube with tip at the stomach and side port near the GE junction. Electronically Signed   By: Tiburcio Pea M.D.   On: 10/08/2022 07:47   US RENAL  Result Date:  10/07/2022 CLINICAL DATA:  Evaluate renal mass. EXAM: RENAL / URINARY TRACT ULTRASOUND COMPLETE COMPARISON:  CT AP 10/07/22. FINDINGS: Right Kidney: Renal measurements: 13.6 x 6.6 x 7.1 cm = volume: 333.7 mL. Echogenicity within normal limits. No hydronephrosis visualized. Complex cystic structure corresponding to the CT finding is noted arising off the upper pole of left kidney measuring 3 x 2.9 x 2.4 cm.  This contains diffuse low-level internal echoes and possible thin internal area of septation. Adjacent simple appearing cyst measures 1.4 x 1.7 x 1.6 cm. Left Kidney: Renal measurements: 13.2 x 6.5 x 7.8 cm = volume: 346.3 mL. Echogenicity within normal limits. No mass or hydronephrosis visualized. Bladder: Appears normal for degree of bladder distention. Other: None. IMPRESSION: 1. No hydronephrosis. 2. Complex cystic structure arising off the upper pole of the left kidney measuring 3 cm. This corresponds to the CT finding and is compatible with Xoe Hoe complex renal cyst. This does not require emergent/stat follow-up. When the patient is clinically stable and able to follow directions and hold their breath (preferably as an outpatient) further evaluation with dedicated abdominal MRI should be considered. 3. Simple appearing cyst arising off the upper pole of the right kidney measures 1.7 cm. Electronically Signed   By: Signa Kell M.D.   On: 10/07/2022 15:39   DG Abd Portable 1 View  Result Date: 10/07/2022 CLINICAL DATA:  Enteric tube placement EXAM: PORTABLE ABDOMEN - 1 VIEW COMPARISON:  CT abdomen and pelvis dated 10/07/2022 FINDINGS: Gastric/enteric tube tip projects over the stomach with side hole projecting over the gastric cardia. Partially imaged lung bases are clear. IMPRESSION: Gastric/enteric tube tip projects over the stomach with side hole projecting over the gastric cardia. Recommend advancing by 5 cm for more optimal positioning. Electronically Signed   By: Agustin Cree M.D.   On: 10/07/2022 14:31    CT ABDOMEN PELVIS W CONTRAST  Result Date: 10/07/2022 CLINICAL DATA:  Abdominal pain, acute, nonlocalized EXAM: CT ABDOMEN AND PELVIS WITH CONTRAST TECHNIQUE: Multidetector CT imaging of the abdomen and pelvis was performed using the standard protocol following bolus administration of intravenous contrast. RADIATION DOSE REDUCTION: This exam was performed according to the departmental dose-optimization program which includes automated exposure control, adjustment of the mA and/or kV according to patient size and/or use of iterative reconstruction technique. CONTRAST:  OMNIPAQUE IOHEXOL 300 MG/ML  SOLN COMPARISON:  CT pelvis 05/05/2014 FINDINGS: Lower chest: No acute abnormality. Hepatobiliary: No focal liver abnormality is seen. No gallstones, gallbladder wall thickening, or biliary dilatation. Pancreas: Unremarkable. No pancreatic ductal dilatation or surrounding inflammatory changes. Spleen: Normal in size without focal abnormality. Adrenals/Urinary Tract: Unremarkable adrenal glands. There is Maydelin Deming exophytic 3.9 cm indeterminate lesion arising from the anterior aspect of the upper pole of the right kidney with internal density of 39 HU. Additional 1.6 cm right renal cyst. There are several other subcentimeter low-density lesions within both kidneys, which are too small to characterize. No renal stone or hydronephrosis. Urinary bladder is largely obscured by metal artifact related to patient's hip prostheses. Stomach/Bowel: Stomach is moderately distended with fluid. Multiple mildly dilated loops of small bowel within the abdomen measuring up to 5.1 cm in diameter. Tapered area of transition to nondilated small bowel within the mid abdomen (series 2, image 58). No abrupt transition point. Normal appendix in the right lower quadrant. Scattered colonic diverticulosis. No focal colonic wall thickening or inflammatory changes. Vascular/Lymphatic: Scattered aortoiliac atherosclerotic calcifications without  aneurysm. No abdominopelvic lymphadenopathy. Reproductive: Obscured by streak artifact within the pelvis. Other: Trace perihepatic free fluid. No abdominopelvic fluid collection. No pneumoperitoneum. No abdominal wall hernia. Musculoskeletal: Bilateral hip prostheses. Prior L4-5 spinal fusion. Advanced multilevel degenerative disc disease of the lumbar spine. No acute bony findings. IMPRESSION: 1. Multiple mildly dilated loops of small bowel within the abdomen measuring up to 5.1 cm in diameter. Tapered area of transition to nondilated small bowel within the mid abdomen.  No abrupt transition point. Findings are most compatible with Lakisa Lotz low-grade or partial small bowel obstruction. 2. Indeterminate 3.9 cm exophytic lesion arising from the anterior aspect of the upper pole of the right kidney. Further evaluation with nonemergent renal ultrasound is recommended. 3. Colonic diverticulosis without evidence of acute diverticulitis. 4. Trace perihepatic free fluid, likely reactive. 5. Aortic atherosclerosis (ICD10-I70.0). Electronically Signed   By: Duanne Guess D.O.   On: 10/07/2022 11:53   CT SHOULDER LEFT WO CONTRAST  Result Date: 10/07/2022 CLINICAL DATA:  Preop for total shoulder arthroplasty. Chronic shoulder pain. EXAM: CT OF THE UPPER LEFT EXTREMITY WITHOUT CONTRAST TECHNIQUE: Multidetector CT imaging of the upper left extremity was performed according to the standard protocol. RADIATION DOSE REDUCTION: This exam was performed according to the departmental dose-optimization program which includes automated exposure control, adjustment of the mA and/or kV according to patient size and/or use of iterative reconstruction technique. COMPARISON:  None Available. FINDINGS: Severe glenohumeral joint degenerative changes with bone-on-bone contact, osteophytic spurring, subchondral cystic change and bony eburnation. The humeral head is riding high in the glenoid fossa and contacting the acromion due to large  full-thickness retracted rotator cuff tear. The glenoid demonstrates significant erosive changes, widening and thinning. No evidence of AVN involving the humeral head. Severe glenohumeral joint degenerative changes. Mild erosion along the undersurface of the acromion due to the contact with the humeral head. Large full-thickness retracted rotator cuff tear with fatty atrophy of the rotator cuff muscles. Moderate-sized glenohumeral joint effusion and moderate amount of fluid in the subacromial/subdeltoid bursa. The visualized left ribs are intact and the visualized left lung is grossly clear. Coronary artery calcifications noted. IMPRESSION: 1. Severe glenohumeral joint degenerative changes as detailed above. 2. Large full-thickness retracted rotator cuff tear with fatty atrophy of the rotator cuff muscles. 3. Moderate-sized glenohumeral joint effusion and moderate amount of fluid in the subacromial/subdeltoid bursa. Electronically Signed   By: Rudie Meyer M.D.   On: 10/07/2022 11:12    Microbiology: Recent Results (from the past 240 hour(s))  Urine Culture     Status: Abnormal   Collection Time: 10/05/22 11:24 AM   Specimen: Urine, Clean Catch  Result Value Ref Range Status   Specimen Description   Final    URINE, CLEAN CATCH Performed at [facility], [address] [place], [place] [address]    Special Requests   Final    NONE Performed at [facility], [address] [place], [place] [address]    Culture >=100,000 COLONIES/mL ESCHERICHIA COLI ([facility])  Final   Report Status 10/07/2022 FINAL  Final   Organism ID, Bacteria ESCHERICHIA COLI (Helen Winterhalter)  Final      Susceptibility   Escherichia coli - MIC*    AMPICILLIN 4 SENSITIVE Sensitive     CEFAZOLIN <=4 SENSITIVE Sensitive     CEFEPIME <=0.12 SENSITIVE Sensitive     CEFTRIAXONE <=0.25 SENSITIVE Sensitive     CIPROFLOXACIN <=0.25 SENSITIVE Sensitive     GENTAMICIN <=1 SENSITIVE Sensitive     IMIPENEM <=0.25 SENSITIVE  Sensitive     NITROFURANTOIN <=16 SENSITIVE Sensitive     TRIMETH/SULFA <=20 SENSITIVE Sensitive     AMPICILLIN/SULBACTAM <=2 SENSITIVE Sensitive     PIP/TAZO <=4 SENSITIVE Sensitive     * >=100,000 COLONIES/mL ESCHERICHIA COLI     Labs: Basic Metabolic Panel: Recent Labs  Lab 10/07/22 1032 10/08/22 0356 10/09/22 0338 10/10/22 0351 10/11/22 0404  NA 127* 132* 137 137 133*  K 3.3* 3.3*  3.5 3.6 3.5  CL 86* 97* 100 100 99  CO2 29 26 25 26 27   GLUCOSE 107* 102* 109* 126* 115*  BUN 15 11 12 10 10   CREATININE 0.77 0.62 0.62 0.58* 0.58*  CALCIUM 8.6* 7.8* 8.0* 8.2* 7.8*  MG 1.9 2.1 2.1 1.8 2.0   Liver Function Tests: Recent Labs  Lab 10/05/22 1120 10/07/22 1032 10/11/22 0404  AST 28 19 42*  ALT 43 37 60*  ALKPHOS 60 56 49  BILITOT 0.7 0.9 0.6  PROT 6.7 7.1 5.8*  ALBUMIN 3.9 3.4* 2.8*   Recent Labs  Lab 10/05/22 1120 10/07/22 1032  LIPASE 32 45   No results for input(s): "AMMONIA" in the last 168 hours. CBC: Recent Labs  Lab 10/05/22 1120 10/07/22 1032 10/08/22 0356 10/11/22 0404  WBC 7.0 6.0 7.5 10.1  NEUTROABS  --  3.6  --   --   HGB 13.8 13.8 13.3 13.1  HCT 41.3 41.4 40.7 40.5  MCV 85.9 85.5 88.5 89.8  PLT 310 332 310 289   Cardiac Enzymes: No results for input(s): "CKTOTAL", "CKMB", "CKMBINDEX", "TROPONINI" in the last 168 hours. BNP: BNP (last 3 results) No results for input(s): "BNP" in the last 8760 hours.  ProBNP (last 3 results) No results for input(s): "PROBNP" in the last 8760 hours.  CBG: Recent Labs  Lab 10/10/22 0059 10/10/22 0350 10/10/22 0813 10/10/22 1205 10/10/22 1650  GLUCAP 113* 121* 126* 116* 108*       Signed:  Lacretia Nicks MD.  [facility] 10/11/2022, 4:49 PM

## 2022-10-11 NOTE — Plan of Care (Signed)

## 2022-10-27 ENCOUNTER — Other Ambulatory Visit: Payer: Self-pay | Admitting: Family Medicine

## 2022-10-27 DIAGNOSIS — N2889 Other specified disorders of kidney and ureter: Secondary | ICD-10-CM

## 2022-11-02 ENCOUNTER — Ambulatory Visit: Admit: 2022-11-02 | Payer: Medicare Other | Admitting: Orthopedic Surgery

## 2022-11-02 SURGERY — ARTHROPLASTY, SHOULDER, TOTAL, REVERSE
Anesthesia: Choice | Site: Shoulder | Laterality: Left

## 2022-12-06 ENCOUNTER — Ambulatory Visit
Admission: RE | Admit: 2022-12-06 | Discharge: 2022-12-06 | Disposition: A | Discharge status: Home / Self Care | Payer: Medicare Other | Source: Ambulatory Visit | Attending: Family Medicine | Admitting: Family Medicine

## 2022-12-06 DIAGNOSIS — N2889 Other specified disorders of kidney and ureter: Secondary | ICD-10-CM

## 2022-12-06 MED ORDER — IOPAMIDOL (ISOVUE-300) INJECTION 61%
125.0000 mL | Freq: Once | INTRAVENOUS | Status: AC | PRN
  Administered 2022-12-06: 125 mL via INTRAVENOUS

## 2023-04-03 ENCOUNTER — Other Ambulatory Visit: Payer: Self-pay | Admitting: Orthopedic Surgery

## 2023-04-18 NOTE — Patient Instructions (Addendum)
 SURGICAL WAITING ROOM VISITATION Patients having surgery or a procedure may have no more than 2 support people in the waiting area - these visitors may rotate in the visitor waiting room.   Due to an increase in RSV and influenza rates and associated hospitalizations, children ages 34 and under may not visit patients in [facility] hospitals. If the patient needs to stay at the hospital during part of their recovery, the visitor guidelines for inpatient rooms apply.  PRE-OP VISITATION  Pre-op nurse will coordinate an appropriate time for 1 support person to accompany the patient in pre-op.  This support person may not rotate.  This visitor will be contacted when the time is appropriate for the visitor to come back in the pre-op area.  Please refer to the [facility] website for the visitor guidelines for Inpatients (after your surgery is over and you are in a regular room).  You are not required to quarantine at this time prior to your surgery. However, you must do this: Hand Hygiene often Do NOT share personal items Notify your provider if you are in close contact with someone who has COVID or you develop fever 100.4 or greater, new onset of sneezing, cough, sore throat, shortness of breath or body aches.  If you test positive for Covid or have been in contact with anyone that has tested positive in the last 10 days please notify you surgeon.    Your procedure is scheduled on:  Thursday  April 26, 2023  Report to [facility] Main Entrance: [place] entrance where the [employer] is available.   Report to admitting at:  06:45   AM  Call this number if you have any questions or problems the morning of surgery [phone]  Do not eat food after Midnight the night prior to your surgery/procedure.  After Midnight you may have the following liquids until   06:15 AM DAY OF SURGERY  Clear Liquid Diet Water  Black Coffee (sugar ok, NO MILK/CREAM OR CREAMERS)  Tea (sugar ok, NO  MILK/CREAM OR CREAMERS) regular and decaf                             Plain Jell-O  with no fruit (NO RED)                                           Fruit ices (not with fruit pulp, NO RED)                                     Popsicles (NO RED)                                                                  Juice: NO CITRUS JUICES: only apple, WHITE grape, WHITE cranberry Sports drinks like Gatorade or Powerade (NO RED)                   The day of surgery:  Drink ONE (1) Pre-Surgery Clear Ensure at  06:15 AM the morning of surgery.  Drink in one sitting. Do not sip.  This drink was given to you during your hospital pre-op appointment visit. Nothing else to drink after completing the Pre-Surgery Clear Ensure : No candy, chewing gum or throat lozenges.    FOLLOW ANY ADDITIONAL PRE OP INSTRUCTIONS YOU RECEIVED FROM YOUR SURGEON'S OFFICE!!!   Oral Hygiene is also important to reduce your risk of infection.        Remember - BRUSH YOUR TEETH THE MORNING OF SURGERY WITH YOUR REGULAR TOOTHPASTE  Do NOT smoke after Midnight the night before surgery.  STOP TAKING all Vitamins, Herbs and supplements 1 week before your surgery.   Take ONLY these medicines the morning of surgery with A SIP OF WATER : Amlodipine, metoprolol .  You may take oxycodone  APAP if needed for pain.  You may use your Afrin nasal spray if needed.    If You have been diagnosed with Sleep Apnea - Bring CPAP mask and tubing day of surgery. We will provide you with a CPAP machine on the day of your surgery.                   You may not have any metal on your body including  jewelry, and body piercing  Do not wear lotions, powders, perfumes or deodorant  Men may shave face and neck.  Contacts, Hearing Aids, dentures or bridgework may not be worn into surgery. DENTURES WILL BE REMOVED PRIOR TO SURGERY PLEASE DO NOT APPLY Poly grip OR ADHESIVES!!!  You may bring a small overnight bag with you on the day of surgery, only  pack items that are not valuable. [facility] IS NOT RESPONSIBLE   FOR VALUABLES THAT ARE LOST OR STOLEN.   Patients discharged on the day of surgery will not be allowed to drive home.  Someone NEEDS to stay with you for the first 24 hours after anesthesia.  Do not bring your home medications to the hospital. The Pharmacy will dispense medications listed on your medication list to you during your admission in the Hospital.  Please read over the following fact sheets you were given: IF YOU HAVE QUESTIONS ABOUT YOUR PRE-OP INSTRUCTIONS, PLEASE CALL [phone].     Pre-operative 5 CHG Bath Instructions   You can play a key role in reducing the risk of infection after surgery. Your skin needs to be as free of germs as possible. You can reduce the number of germs on your skin by washing with CHG (chlorhexidine  gluconate) soap before surgery. CHG is an antiseptic soap that kills germs and continues to kill germs even after washing.   DO NOT use if you have an allergy to chlorhexidine /CHG or antibacterial soaps. If your skin becomes reddened or irritated, stop using the CHG and notify one of our RNs at [phone]  Please shower with the CHG soap starting 4 days before surgery using the following schedule: START SHOWERS ON   SUNDAY  April 22, 2023  Please keep in mind the following:  DO NOT shave, including legs and underarms, starting the day of your first shower.   You may shave your face at any point before/day of surgery.   Place clean sheets on your bed the day you start using CHG soap. Use a clean washcloth (not used since being washed) for each shower. DO NOT sleep with pets once you start using the CHG.   CHG Shower Instructions:  If you choose to wash your hair and private area, wash first with your normal  shampoo/soap.  After you use shampoo/soap, rinse your hair and body thoroughly to remove shampoo/soap residue.  Turn the water  OFF and apply about 3 tablespoons (45 ml) of CHG soap to a CLEAN washcloth.  Apply CHG soap ONLY FROM YOUR NECK DOWN TO YOUR TOES (washing for 3-5 minutes)  DO NOT use CHG soap on face, private areas, open wounds, or sores.  Pay special attention to the area where your surgery is being performed.  If you are having back surgery, having someone wash your back for you may be helpful.  Wait 2 minutes after CHG soap is applied, then you may rinse off the CHG soap.  Pat dry with a clean towel  Put on clean clothes/pajamas   If you choose to wear lotion, please use ONLY the CHG-compatible lotions on the back of this paper.     Additional instructions for the day of surgery: DO NOT APPLY any lotions, deodorants, cologne, or perfumes.   Put on clean/comfortable clothes.  Brush your teeth.  Ask your nurse before applying any prescription medications to the skin.      CHG Compatible Lotions   Aveeno Moisturizing lotion  Cetaphil Moisturizing Cream  Cetaphil Moisturizing Lotion  Clairol Herbal Essence Moisturizing Lotion, Dry Skin  Clairol Herbal Essence Moisturizing Lotion, Extra Dry Skin  Clairol Herbal Essence Moisturizing Lotion, Normal Skin  Curel Age Defying Therapeutic Moisturizing Lotion with Alpha Hydroxy  Curel Extreme Care Body Lotion  Curel Soothing Hands Moisturizing Hand Lotion  Curel Therapeutic Moisturizing Cream, Fragrance-Free  Curel Therapeutic Moisturizing Lotion, Fragrance-Free  Curel Therapeutic Moisturizing Lotion, Original Formula  Eucerin Daily Replenishing Lotion  Eucerin Dry Skin Therapy Plus Alpha Hydroxy Crme  Eucerin Dry Skin Therapy Plus Alpha Hydroxy Lotion  Eucerin Original Crme  Eucerin Original Lotion  Eucerin Plus Crme Eucerin Plus Lotion  Eucerin TriLipid Replenishing Lotion  Keri Anti-Bacterial Hand Lotion  Keri Deep  Conditioning Original Lotion Dry Skin Formula Softly Scented  Keri Deep Conditioning Original Lotion, Fragrance Free Sensitive Skin Formula  Keri Lotion Fast Absorbing Fragrance Free Sensitive Skin Formula  Keri Lotion Fast Absorbing Softly Scented Dry Skin Formula  Keri Original Lotion  Keri Skin Renewal Lotion Keri Silky Smooth Lotion  Keri Silky Smooth Sensitive Skin Lotion  Nivea Body Creamy Conditioning Oil  Nivea Body Extra Enriched Lotion  Nivea Body Original Lotion  Nivea Body Sheer Moisturizing Lotion Nivea Crme  Nivea Skin Firming Lotion  NutraDerm 30 Skin Lotion  NutraDerm Skin Lotion  NutraDerm Therapeutic Skin Cream  NutraDerm Therapeutic Skin Lotion  ProShield Protective Hand Cream  Provon moisturizing lotion    Preparing for Total Shoulder Arthroplasty ================================================================= Please follow these instructions carefully, in addition to any other special Bathing information that was explained to you at the Presurgical Appointment:  BENZOYL PEROXIDE 5% GEL: Used to kill bacteria on the skin which could cause an infection at the surgery site.   Please do not use if you have an allergy  to benzoyl peroxide. If your skin becomes reddened/irritated stop using the benzoyl peroxide and inform your Doctor.   Starting two days before surgery, apply as follows:  1. Apply benzoyl peroxide gel in the morning and at night. Apply after taking a shower. If you are not taking a shower, clean entire shoulder front, back, and side, along with the armpit with a clean wet washcloth.  2. Place a quarter-sized dollop of the gel on your SHOULDER and rub in thoroughly, making sure to cover the front, back, and side of your shoulder, along with the armpit.   2 Days prior to Surgery    TUESDAY  April 24, 2023 First Application _______ Morning Second Application _______ Night  Day Before Surgery     [facility]  April 25, 2023 First  Application______ Morning  On the night before surgery, wash your entire body (except hair, face and private areas) with CHG Soap. THEN, rub in the LAST application of the Benzoyl Peroxide Gel on your shoulder.   3. On the Morning of Surgery wash your BODY AGAIN with CHG Soap (except hair, face and private areas)  4. DO NOT USE THE BENZOYL PEROXIDE GEL ON THE DAY OF YOUR SURGERY       FAILURE TO FOLLOW THESE INSTRUCTIONS MAY RESULT IN THE CANCELLATION OF YOUR SURGERY  PATIENT SIGNATURE_________________________________  NURSE SIGNATURE__________________________________  ________________________________________________________________________     [name]    An incentive spirometer is a tool that can help keep your lungs clear and active. This tool measures how well you are filling your lungs with each breath. Taking long deep breaths may help reverse or decrease the chance of developing breathing (pulmonary) problems (especially infection) following: A long period of time when you are unable to move or be active. BEFORE THE PROCEDURE  If the spirometer includes an indicator to show your best effort, your nurse or respiratory therapist will set it to a desired goal. If possible, sit up straight or lean slightly forward. Try not to slouch. Hold the incentive spirometer in an upright position. INSTRUCTIONS FOR USE  Sit on the edge of your bed if possible, or sit up as far as you can in bed or on a chair. Hold the incentive spirometer in an upright position. Breathe out normally. Place the mouthpiece in your mouth and seal your lips tightly around it. Breathe in slowly and as deeply as possible, raising the piston or the ball toward the top of the column. Hold your breath for 3-5 seconds or for as long as possible. Allow the piston or ball to fall to the bottom of the column. Remove the mouthpiece from your mouth and breathe out normally. Rest for a few seconds and  repeat Steps 1 through 7 at least 10 times every 1-2 hours when you are awake. Take your time and take a few normal breaths between deep breaths. The spirometer may include an indicator to show your best effort. Use the indicator as a goal to work toward during each repetition. After each set of 10 deep breaths, practice coughing to be sure your lungs are clear. If you have an incision (the cut made at the time of surgery), support your incision when coughing by placing a pillow or rolled up towels firmly against it. Once you are able to get out of bed, walk around indoors and cough well. You may stop using the incentive spirometer when instructed by your caregiver.  RISKS AND COMPLICATIONS Take your time so you do  not get dizzy or light-headed. If you are in pain, you may need to take or ask for pain medication before doing incentive spirometry. It is harder to take a deep breath if you are having pain. AFTER USE Rest and breathe slowly and easily. It can be helpful to keep track of a log of your progress. Your caregiver can provide you with a simple table to help with this. If you are using the spirometer at home, follow these instructions: SEEK MEDICAL CARE IF:  You are having difficultly using the spirometer. You have trouble using the spirometer as often as instructed. Your pain medication is not giving enough relief while using the spirometer. You develop fever of 100.5 F (38.1 C) or higher.                                                                                                    SEEK IMMEDIATE MEDICAL CARE IF:  You cough up bloody sputum that had not been present before. You develop fever of 102 F (38.9 C) or greater. You develop worsening pain at or near the incision site. MAKE SURE YOU:  Understand these instructions. Will watch your condition. Will get help right away if you are not doing well or get worse. Document Released: 08/14/2006 Document Revised: 06/26/2011  Document Reviewed: 10/15/2006 [facility] Patient Information 2014 [place], [place].

## 2023-04-18 NOTE — Progress Notes (Addendum)
 COVID Vaccine received:  []  No [x]  Yes Date of any COVID positive Test in last 90 days: None  PCP - Sari Pay, MD at [facility]  Cardiologist -  none  Chest x-ray -  EKG - 10-07-2022  Epic  Stress Test - Lexiscan  06-23-2010  ECHO -07-31-2022  Epic Cardiac Cath -   PCR screen: [x]  Ordered & Completed []   No Order but Needs PROFEND     [x]   N/A for this surgery  Surgery Plan:  [x]  Ambulatory   []  Outpatient in bed  []  Admit Anesthesia:    []  General  []  Spinal  [x]   Choice []   MAC  Pacemaker / ICD device [x]  No []  Yes   Spinal Cord Stimulator:[x]  No []  Yes       History of Sleep Apnea? []  No [x]  Yes   CPAP used?- []  No [x]  Yes    Does the patient monitor blood sugar?   [x]  N/A   []  No []  Yes  Patient has: [x]  NO Hx DM   []  Pre-DM   []  DM1  []   DM2  Blood Thinner / Instructions: none Aspirin  Instructions:  ASA 325 mg  Hold x 1 week, patient is aware  ERAS Protocol Ordered: []  No  [x]  Yes PRE-SURGERY [x]  ENSURE  []  G2    Patient is to be NPO after: 0615  Dental hx: []  Dentures:  [x]  N/A      []  Bridge or Partial:                   []  Loose or Damaged teeth:   Comments: The patient was given Benzoyl peroxide Gel as ordered. Instruction regarding application starting 2 days prior to surgery was given and patient voiced understanding.   Patient was given the 5 CHG shower / bath instructions for Reverse Shoulder arthroplasty surgery along with 2 bottles of the CHG soap. Patient will start this on:  Sunday 04-22-23  All questions were asked and answered, Patient voiced understanding of this process.   Activity level: Patient is able to climb a flight of stairs without difficulty; [x]  No CP  but would have SOB. Patient can perform ADLs without assistance.   Anesthesia review: OSA-CPAP, HTN, S/p Posterior cervical fusion (C3-C6 on 05-02-2017) s/p 2 level PLIF (L1-2, L4-5)  ACDF C5-6 in 1999, ?LFTs,     Burnard Senna, PA came and examined the patient's ROM in his neck.    Patient denies shortness of breath, fever, cough and chest pain at PAT appointment.  Patient verbalized understanding and agreement to the Pre-Surgical Instructions that were given to them at this PAT appointment. Patient was also educated of the need to review these PAT instructions again prior to his/her surgery.I reviewed the appropriate phone numbers to call if they have any and questions or concerns.

## 2023-04-19 NOTE — Care Plan (Signed)
 Ortho Bundle Case Management Note  Patient Details  Name: [name] MRN: [id number] Date of Birth: 24-Jun-1955  Spoke with patient prior to surgery. Will discharge to home with family to assist. No DME needed. OPPT set up with [facility]. Patient and MD in agreement with plan. Choice offered                     DME Arranged:    DME Agency:     HH Arranged:    HH Agency:     Additional Comments: Please contact me with any questions of if this plan should need to change.  Charlies Pitch,  RN,BSN,MHA,CCM  [facility] Orthopaedic Specialist  [phone] 04/19/2023, 8:19 AM

## 2023-04-20 ENCOUNTER — Ambulatory Visit (HOSPITAL_COMMUNITY)
Admission: RE | Admit: 2023-04-20 | Discharge: 2023-04-20 | Disposition: A | Discharge status: Home / Self Care | Payer: Medicare Other | Source: Ambulatory Visit | Attending: Orthopedic Surgery | Admitting: Orthopedic Surgery

## 2023-04-20 ENCOUNTER — Other Ambulatory Visit: Payer: Self-pay

## 2023-04-20 ENCOUNTER — Encounter (HOSPITAL_COMMUNITY)
Admission: RE | Admit: 2023-04-20 | Discharge: 2023-04-20 | Disposition: A | Discharge status: Home / Self Care | Payer: Medicare Other | Source: Ambulatory Visit | Attending: Orthopedic Surgery

## 2023-04-20 ENCOUNTER — Encounter (HOSPITAL_COMMUNITY): Payer: Self-pay

## 2023-04-20 VITALS — BP 136/64 | HR 84 | Temp 98.7°F | Resp 22 | Ht 71.0 in | Wt 318.0 lb

## 2023-04-20 DIAGNOSIS — R7989 Other specified abnormal findings of blood chemistry: Secondary | ICD-10-CM | POA: Insufficient documentation

## 2023-04-20 DIAGNOSIS — Z01818 Encounter for other preprocedural examination: Secondary | ICD-10-CM | POA: Diagnosis present

## 2023-04-20 DIAGNOSIS — I10 Essential (primary) hypertension: Secondary | ICD-10-CM | POA: Insufficient documentation

## 2023-04-20 LAB — COMPREHENSIVE METABOLIC PANEL
ALT: 30 U/L (ref 0–44)
AST: 23 U/L (ref 15–41)
Albumin: 4.1 g/dL (ref 3.5–5.0)
Alkaline Phosphatase: 67 U/L (ref 38–126)
Anion gap: 11 (ref 5–15)
BUN: 18 mg/dL (ref 8–23)
CO2: 29 mmol/L (ref 22–32)
Calcium: 9.6 mg/dL (ref 8.9–10.3)
Chloride: 97 mmol/L — ABNORMAL LOW (ref 98–111)
Creatinine, Ser: 0.76 mg/dL (ref 0.61–1.24)
GFR, Estimated: >60 mL/min (ref >60)
Glucose, Bld: 106 mg/dL — ABNORMAL HIGH (ref 70–99)
Potassium: 4.6 mmol/L (ref 3.5–5.1)
Sodium: 137 mmol/L (ref 135–145)
Total Bilirubin: 0.5 mg/dL (ref 0.0–1.2)
Total Protein: 7.5 g/dL (ref 6.5–8.1)

## 2023-04-20 LAB — SURGICAL PCR SCREEN
MRSA, PCR: NEGATIVE
Staphylococcus aureus: NEGATIVE

## 2023-04-20 LAB — CBC
HCT: 45.2 % (ref 39.0–52.0)
Hemoglobin: 14.5 g/dL (ref 13.0–17.0)
MCH: 28.7 pg (ref 26.0–34.0)
MCHC: 32.1 g/dL (ref 30.0–36.0)
MCV: 89.5 fL (ref 80.0–100.0)
Platelets: 242 10*3/uL (ref 150–400)
RBC: 5.05 MIL/uL (ref 4.22–5.81)
RDW: 13.5 % (ref 11.5–15.5)
WBC: 9.2 10*3/uL (ref 4.0–10.5)
nRBC: 0 % (ref 0.0–0.2)

## 2023-04-26 ENCOUNTER — Other Ambulatory Visit: Payer: Self-pay

## 2023-04-26 ENCOUNTER — Encounter (HOSPITAL_COMMUNITY): Payer: Self-pay | Admitting: Orthopedic Surgery

## 2023-04-26 ENCOUNTER — Ambulatory Visit (HOSPITAL_COMMUNITY): Payer: Medicare Other | Admitting: Physician Assistant

## 2023-04-26 ENCOUNTER — Encounter (HOSPITAL_COMMUNITY)
Admission: RE | Disposition: A | Discharge status: Home / Self Care | Payer: Self-pay | Source: Home / Self Care | Attending: Orthopedic Surgery

## 2023-04-26 ENCOUNTER — Ambulatory Visit (HOSPITAL_COMMUNITY): Payer: Medicare Other | Admitting: Certified Registered"

## 2023-04-26 ENCOUNTER — Ambulatory Visit (HOSPITAL_COMMUNITY)
Admission: RE | Admit: 2023-04-26 | Discharge: 2023-04-26 | Disposition: A | Discharge status: Home / Self Care | Payer: Medicare Other | Attending: Orthopedic Surgery | Admitting: Orthopedic Surgery

## 2023-04-26 DIAGNOSIS — G4733 Obstructive sleep apnea (adult) (pediatric): Secondary | ICD-10-CM | POA: Insufficient documentation

## 2023-04-26 DIAGNOSIS — E66813 Obesity, class 3: Secondary | ICD-10-CM | POA: Diagnosis not present

## 2023-04-26 DIAGNOSIS — Z79899 Other long term (current) drug therapy: Secondary | ICD-10-CM | POA: Insufficient documentation

## 2023-04-26 DIAGNOSIS — M19012 Primary osteoarthritis, left shoulder: Secondary | ICD-10-CM | POA: Diagnosis present

## 2023-04-26 DIAGNOSIS — Z6841 Body Mass Index (BMI) 40.0 and over, adult: Secondary | ICD-10-CM | POA: Diagnosis not present

## 2023-04-26 DIAGNOSIS — M75102 Unspecified rotator cuff tear or rupture of left shoulder, not specified as traumatic: Secondary | ICD-10-CM | POA: Insufficient documentation

## 2023-04-26 DIAGNOSIS — I10 Essential (primary) hypertension: Secondary | ICD-10-CM | POA: Insufficient documentation

## 2023-04-26 DIAGNOSIS — M12812 Other specific arthropathies, not elsewhere classified, left shoulder: Secondary | ICD-10-CM

## 2023-04-26 HISTORY — PX: REVERSE SHOULDER ARTHROPLASTY: SHX5054

## 2023-04-26 SURGERY — ARTHROPLASTY, SHOULDER, TOTAL, REVERSE
Anesthesia: Regional | Site: Shoulder | Laterality: Left

## 2023-04-26 MED ORDER — LEVOFLOXACIN IN D5W 500 MG/100ML IV SOLN
500.0000 mg | INTRAVENOUS | Status: DC
  Filled 2023-04-26: qty 100

## 2023-04-26 MED ORDER — PHENYLEPHRINE HCL-NACL 20-0.9 MG/250ML-% IV SOLN
INTRAVENOUS | Status: DC | PRN
  Administered 2023-04-26: 50 ug/min via INTRAVENOUS

## 2023-04-26 MED ORDER — CHLORHEXIDINE GLUCONATE 0.12 % MT SOLN
15.0000 mL | Freq: Once | OROMUCOSAL | Status: AC
  Administered 2023-04-26: 15 mL via OROMUCOSAL

## 2023-04-26 MED ORDER — VASOPRESSIN 20 UNIT/ML IV SOLN
INTRAVENOUS | Status: AC
  Filled 2023-04-26: qty 1

## 2023-04-26 MED ORDER — ACETAMINOPHEN 10 MG/ML IV SOLN
1000.0000 mg | Freq: Once | INTRAVENOUS | Status: DC | PRN
Start: 2023-04-26 — End: 2023-04-26

## 2023-04-26 MED ORDER — SUCCINYLCHOLINE CHLORIDE 200 MG/10ML IV SOSY
PREFILLED_SYRINGE | INTRAVENOUS | Status: DC | PRN
  Administered 2023-04-26: 200 mg via INTRAVENOUS

## 2023-04-26 MED ORDER — FENTANYL CITRATE (PF) 100 MCG/2ML IJ SOLN
INTRAMUSCULAR | Status: DC | PRN
  Administered 2023-04-26: 100 ug via INTRAVENOUS

## 2023-04-26 MED ORDER — PHENYLEPHRINE HCL-NACL 20-0.9 MG/250ML-% IV SOLN
INTRAVENOUS | Status: AC
  Filled 2023-04-26: qty 250

## 2023-04-26 MED ORDER — VASOPRESSIN 20 UNIT/ML IV SOLN
INTRAVENOUS | Status: DC | PRN
  Administered 2023-04-26 (×3): 1 [IU] via INTRAVENOUS

## 2023-04-26 MED ORDER — BUPIVACAINE LIPOSOME 1.3 % IJ SUSP
INTRAMUSCULAR | Status: DC | PRN
  Administered 2023-04-26: 10 mL via PERINEURAL

## 2023-04-26 MED ORDER — CEFAZOLIN SODIUM-DEXTROSE 2-4 GM/100ML-% IV SOLN
INTRAVENOUS | Status: AC
  Filled 2023-04-26: qty 100

## 2023-04-26 MED ORDER — DEXAMETHASONE SODIUM PHOSPHATE 4 MG/ML IJ SOLN
INTRAMUSCULAR | Status: DC | PRN
  Administered 2023-04-26: 5 mg via INTRAVENOUS

## 2023-04-26 MED ORDER — PROPOFOL 10 MG/ML IV BOLUS
INTRAVENOUS | Status: AC
  Filled 2023-04-26: qty 20

## 2023-04-26 MED ORDER — PROPOFOL 10 MG/ML IV BOLUS
INTRAVENOUS | Status: DC | PRN
  Administered 2023-04-26: 250 mg via INTRAVENOUS

## 2023-04-26 MED ORDER — LACTATED RINGERS IV SOLN
INTRAVENOUS | Status: DC
Start: 2023-04-26 — End: 2023-04-26

## 2023-04-26 MED ORDER — CEFAZOLIN SODIUM 1 G IJ SOLR
INTRAMUSCULAR | Status: AC
  Filled 2023-04-26: qty 10

## 2023-04-26 MED ORDER — FENTANYL CITRATE PF 50 MCG/ML IJ SOSY: 25.0000 ug | PREFILLED_SYRINGE | INTRAMUSCULAR | Status: DC | PRN

## 2023-04-26 MED ORDER — VANCOMYCIN HCL 1.5 G IV SOLR
1500.0000 mg | INTRAVENOUS | Status: AC
  Administered 2023-04-26: 1500 mg via INTRAVENOUS
  Filled 2023-04-26: qty 30

## 2023-04-26 MED ORDER — ORAL CARE MOUTH RINSE: 15.0000 mL | Freq: Once | OROMUCOSAL | Status: AC

## 2023-04-26 MED ORDER — LACTATED RINGERS IV SOLN: INTRAVENOUS | Status: DC | PRN

## 2023-04-26 MED ORDER — ROCURONIUM BROMIDE 100 MG/10ML IV SOLN
INTRAVENOUS | Status: DC | PRN
  Administered 2023-04-26: 60 mg via INTRAVENOUS

## 2023-04-26 MED ORDER — 0.9 % SODIUM CHLORIDE (POUR BTL) OPTIME
TOPICAL | Status: DC | PRN
  Administered 2023-04-26: 1000 mL

## 2023-04-26 MED ORDER — FENTANYL CITRATE (PF) 100 MCG/2ML IJ SOLN
INTRAMUSCULAR | Status: AC
  Filled 2023-04-26: qty 2

## 2023-04-26 MED ORDER — FENTANYL CITRATE PF 50 MCG/ML IJ SOSY
50.0000 ug | PREFILLED_SYRINGE | INTRAMUSCULAR | Status: DC | PRN
Start: 2023-04-26 — End: 2023-04-26
  Administered 2023-04-26: 50 ug via INTRAVENOUS
  Filled 2023-04-26: qty 2

## 2023-04-26 MED ORDER — PHENYLEPHRINE HCL (PRESSORS) 10 MG/ML IV SOLN
INTRAVENOUS | Status: DC | PRN
  Administered 2023-04-26 (×2): 400 ug via INTRAVENOUS

## 2023-04-26 MED ORDER — ONDANSETRON HCL 4 MG/2ML IJ SOLN
INTRAMUSCULAR | Status: DC | PRN
  Administered 2023-04-26: 4 mg via INTRAVENOUS

## 2023-04-26 MED ORDER — BUPIVACAINE HCL (PF) 0.5 % IJ SOLN
INTRAMUSCULAR | Status: DC | PRN
  Administered 2023-04-26: 15 mL via PERINEURAL

## 2023-04-26 MED ORDER — SODIUM CHLORIDE 0.9 % IR SOLN
Status: DC | PRN
  Administered 2023-04-26: 1000 mL

## 2023-04-26 MED ORDER — SUGAMMADEX SODIUM 200 MG/2ML IV SOLN
INTRAVENOUS | Status: DC | PRN
  Administered 2023-04-26: 300 mg via INTRAVENOUS

## 2023-04-26 MED ORDER — MIDAZOLAM HCL 2 MG/2ML IJ SOLN
1.0000 mg | INTRAMUSCULAR | Status: DC | PRN
  Administered 2023-04-26: 1 mg via INTRAVENOUS
  Filled 2023-04-26: qty 2

## 2023-04-26 MED ORDER — DEXTROSE 5 % IV SOLN
INTRAVENOUS | Status: DC | PRN
  Administered 2023-04-26: 3 g via INTRAVENOUS

## 2023-04-26 MED ORDER — TRANEXAMIC ACID-NACL 1000-0.7 MG/100ML-% IV SOLN
1000.0000 mg | INTRAVENOUS | Status: AC
  Administered 2023-04-26: 1000 mg via INTRAVENOUS
  Filled 2023-04-26: qty 100

## 2023-04-26 MED ORDER — LIDOCAINE HCL (CARDIAC) PF 100 MG/5ML IV SOSY
PREFILLED_SYRINGE | INTRAVENOUS | Status: DC | PRN
Start: 2023-04-26 — End: 2023-04-26
  Administered 2023-04-26: 80 mg via INTRAVENOUS

## 2023-04-26 MED ORDER — EPHEDRINE SULFATE (PRESSORS) 50 MG/ML IJ SOLN
INTRAMUSCULAR | Status: DC | PRN
  Administered 2023-04-26 (×2): 10 mg via INTRAVENOUS

## 2023-04-26 SURGICAL SUPPLY — 70 items
BAG COUNTER SPONGE SURGICOUNT (BAG) IMPLANT
BAG ZIPLOCK 12X15 (MISCELLANEOUS) ×1 IMPLANT
BASEPLATE P2 COATD GLND 6.5X30 (Shoulder) IMPLANT
BIT DRILL 1.6MX128 (BIT) IMPLANT
BIT DRILL 2.5 DIA 127 CALI (BIT) IMPLANT
BIT DRILL 4 DIA CALIBRATED (BIT) IMPLANT
BLADE SAW SGTL 73X25 THK (BLADE) ×1 IMPLANT
BOOTIES KNEE HIGH SLOAN (MISCELLANEOUS) ×2 IMPLANT
COOLER ICEMAN CLASSIC (MISCELLANEOUS) ×1 IMPLANT
COVER BACK TABLE 60X90IN (DRAPES) ×1 IMPLANT
COVER SURGICAL LIGHT HANDLE (MISCELLANEOUS) ×1 IMPLANT
DRAPE INCISE IOBAN 66X45 STRL (DRAPES) ×1 IMPLANT
DRAPE POUCH INSTRU U-SHP 10X18 (DRAPES) ×1 IMPLANT
DRAPE SHEET LG 3/4 BI-LAMINATE (DRAPES) ×1 IMPLANT
DRAPE SURG 17X11 SM STRL (DRAPES) ×1 IMPLANT
DRAPE SURG ORHT 6 SPLT 77X108 (DRAPES) ×2 IMPLANT
DRAPE TOP 10253 STERILE (DRAPES) ×1 IMPLANT
DRAPE U-SHAPE 47X51 STRL (DRAPES) ×1 IMPLANT
DRSG AQUACEL AG ADV 3.5X 6 (GAUZE/BANDAGES/DRESSINGS) ×1 IMPLANT
DRSG AQUACEL AG ADV 3.5X10 (GAUZE/BANDAGES/DRESSINGS) IMPLANT
DURAPREP 26ML APPLICATOR (WOUND CARE) ×2 IMPLANT
ELECT BLADE TIP CTD 4 INCH (ELECTRODE) ×1 IMPLANT
ELECT REM PT RETURN 15FT ADLT (MISCELLANEOUS) ×1 IMPLANT
FACESHIELD WRAPAROUND (MASK) ×1
FACESHIELD WRAPAROUND OR TEAM (MASK) ×1 IMPLANT
GLOVE BIO SURGEON STRL SZ7.5 (GLOVE) ×1 IMPLANT
GLOVE BIOGEL PI IND STRL 6.5 (GLOVE) ×1 IMPLANT
GLOVE BIOGEL PI IND STRL 8 (GLOVE) ×1 IMPLANT
GLOVE SURG SS PI 6.5 STRL IVOR (GLOVE) ×1 IMPLANT
GOWN STRL REUS W/ TWL LRG LVL3 (GOWN DISPOSABLE) ×1 IMPLANT
GOWN STRL REUS W/ TWL XL LVL3 (GOWN DISPOSABLE) ×1 IMPLANT
HEAD GLENOID W/SCREW 32MM (Shoulder) IMPLANT
HOOD PEEL AWAY T7 (MISCELLANEOUS) ×3 IMPLANT
INSERT EPOLY STND HUMERUS+4 36 (Shoulder) ×1 IMPLANT
INSERT EPOLYSTD HUMERUS+4 36 (Shoulder) IMPLANT
KIT BASIN OR (CUSTOM PROCEDURE TRAY) ×1 IMPLANT
KIT TURNOVER KIT A (KITS) IMPLANT
MANIFOLD NEPTUNE II (INSTRUMENTS) ×1 IMPLANT
MAT PREVALON FULL STRYKER (MISCELLANEOUS) IMPLANT
NDL TROCAR POINT SZ 2 1/2 (NEEDLE) IMPLANT
NEEDLE TROCAR POINT SZ 2 1/2 (NEEDLE)
NS IRRIG 1000ML POUR BTL (IV SOLUTION) ×1 IMPLANT
P2 COATDE GLNOID BSEPLT 6.5X30 (Shoulder) ×1 IMPLANT
PACK SHOULDER (CUSTOM PROCEDURE TRAY) ×1 IMPLANT
PAD COLD SHLDR WRAP-ON (PAD) ×1 IMPLANT
PROTECTOR NERVE ULNAR (MISCELLANEOUS) IMPLANT
RESTRAINT HEAD UNIVERSAL NS (MISCELLANEOUS) ×1 IMPLANT
RETRIEVER SUT HEWSON (MISCELLANEOUS) IMPLANT
SCREW BONE LOCKING RSP 5.0X30 (Screw) ×1 IMPLANT
SCREW BONE LOCKING RSP 5.0X34 (Screw) ×1 IMPLANT
SCREW BONE RSP LOCK 5X18 (Screw) IMPLANT
SCREW BONE RSP LOCK 5X30 (Screw) IMPLANT
SCREW BONE RSP LOCK 5X34 (Screw) IMPLANT
SCREW BONE RSP LOCKING 18MM LG (Screw) ×2 IMPLANT
SET HNDPC FAN SPRY TIP SCT (DISPOSABLE) ×1 IMPLANT
SLING ARM IMMOBILIZER XL (CAST SUPPLIES) IMPLANT
STEM HUMERAL STD SHELL 10X48 (Stem) IMPLANT
STRIP CLOSURE SKIN 1/2X4 (GAUZE/BANDAGES/DRESSINGS) ×1 IMPLANT
SUCTION TUBE FRAZIER 10FR DISP (SUCTIONS) IMPLANT
SUPPORT WRAP ARM LG (MISCELLANEOUS) ×1 IMPLANT
SUT ETHIBOND 2 V 37 (SUTURE) IMPLANT
SUT FIBERWIRE #2 38 REV NDL BL (SUTURE)
SUT MNCRL AB 4-0 PS2 18 (SUTURE) ×1 IMPLANT
SUT VIC AB 2-0 CT1 TAPERPNT 27 (SUTURE) ×2 IMPLANT
SUTURE FIBERWR#2 38 REV NDL BL (SUTURE) IMPLANT
TAP SURG THRD DJ 6.5 (ORTHOPEDIC DISPOSABLE SUPPLIES) IMPLANT
TAPE LABRALWHITE 1.5X36 (TAPE) IMPLANT
TAPE SUT LABRALTAP WHT/BLK (SUTURE) IMPLANT
TOWEL OR 17X26 10 PK STRL BLUE (TOWEL DISPOSABLE) ×1 IMPLANT
WATER STERILE IRR 1000ML POUR (IV SOLUTION) ×1 IMPLANT

## 2023-04-26 NOTE — Anesthesia Procedure Notes (Signed)
 Procedure Name: Intubation Date/Time: 04/26/2023 9:06 AM  Performed by: Dartha Meckel, CRNAPre-anesthesia Checklist: Patient identified, Emergency Drugs available, Suction available and Patient being monitored Patient Re-evaluated:Patient Re-evaluated prior to induction Oxygen Delivery Method: Circle system utilized Preoxygenation: Pre-oxygenation with 100% oxygen Induction Type: IV induction Ventilation: Mask ventilation without difficulty Laryngoscope Size: Glidescope and 4 Grade View: Grade I Tube type: Oral Tube size: 7.5 mm Number of attempts: 1 Airway Equipment and Method: Stylet and Oral airway Placement Confirmation: ETT inserted through vocal cords under direct vision, positive ETCO2 and breath sounds checked- equal and bilateral Secured at: 23 cm Tube secured with: Tape Dental Injury: Teeth and Oropharynx as per pre-operative assessment

## 2023-04-26 NOTE — Transfer of Care (Signed)
 Immediate Anesthesia Transfer of Care Note  Patient: [name]  Procedure(s) Performed: REVERSE SHOULDER ARTHROPLASTY (Left: Shoulder)  Patient Location: PACU  Anesthesia Type:General  Level of Consciousness: awake and alert   Airway & Oxygen Therapy: Patient Spontanous Breathing and Patient connected to face mask oxygen  Post-op Assessment: Report given to RN and Post -op Vital signs reviewed and stable  Post vital signs: Reviewed and stable  Last Vitals:  Vitals Value Taken Time  BP 152/79 04/26/23 0949  Temp    Pulse 90 04/26/23 0950  Resp 16 04/26/23 0950  SpO2 94 % 04/26/23 0950  Vitals shown include unfiled device data.  Last Pain:  Vitals:   04/26/23 0800  TempSrc:   PainSc: 0-No pain      Patients Stated Pain Goal: 4 (04/26/23 0713)  Complications: No notable events documented.

## 2023-04-26 NOTE — Anesthesia Postprocedure Evaluation (Signed)
 Anesthesia Post Note  Patient: [name]  Procedure(s) Performed: REVERSE SHOULDER ARTHROPLASTY (Left: Shoulder)     Patient location during evaluation: PACU Anesthesia Type: Regional and General Level of consciousness: awake and alert Pain management: pain level controlled Vital Signs Assessment: post-procedure vital signs reviewed and stable Respiratory status: spontaneous breathing, nonlabored ventilation, respiratory function stable and patient connected to nasal cannula oxygen Cardiovascular status: blood pressure returned to baseline and stable Postop Assessment: no apparent nausea or vomiting Anesthetic complications: no   No notable events documented.  Last Vitals:  Vitals:   04/26/23 1215 04/26/23 1219  BP: 115/65 112/68  Pulse: 88   Resp: 18   Temp: 36.5 C 36.5 C  SpO2: 91%     Last Pain:  Vitals:   04/26/23 1219  TempSrc: Oral  PainSc:                  [name] [name]

## 2023-04-26 NOTE — Evaluation (Signed)
 Occupational Therapy Evaluation Patient Details Name: [name] MRN: [id number] DOB: May 02, 1955 Today's Date: 04/26/2023   History of Present Illness 68 yo s/p L Reverse TSA. PMH: back and neck surgery; CTR.   Clinical Impression   PTA pt lives with his wife who assists with ADL tasks due to B shoulder impairment and body habitus. Education completed regarding compensatory strategies for ADL tasks and functional mobility, management of sling, ROM per specified parameters in the order set as indicated below, positioning of operative arm in sitting and supine and edema control, including use of Iceman Cold Therapy machine. Caregiver present for education, written handouts provided and reviewed using Teach Back and pt/caregiver verbalized/demonstrated understanding. Due to the below listed deficits, pt requires mod  assistance with ADL tasks and Min assist with functional mobility. Caregiver will be able to provide necessary level of assistance at discharge.  Pt to follow up with MD to progress rehab of the operative shoulder.        If plan is discharge home, recommend the following:      Functional Status Assessment  Patient has had a recent decline in their functional status and demonstrates the ability to make significant improvements in function in a reasonable and predictable amount of time.  Equipment Recommendations  None recommended by OT    Recommendations for Other Services       Precautions / Restrictions Precautions Precautions: Shoulder Type of Shoulder Precautions: no ROM shoulder; elbow/wrist/hand ROM only Shoulder Interventions: Shoulder sling/immobilizer;At all times;Off for dressing/bathing/exercises Precaution Booklet Issued: Yes (comment) Precaution Comments: no shoulder AROM; elbow/wrist/hand ROM WFL Required Braces or Orthoses: Sling Restrictions Weight Bearing Restrictions Per Provider Order: Yes LUE Weight Bearing Per Provider Order: Non weight bearing       Mobility Bed Mobility                    Transfers Overall transfer level: Needs assistance   Transfers: Sit to/from Stand Sit to Stand: Contact guard assist                  Balance                                           ADL either performed or assessed with clinical judgement   ADL Overall ADL's : Needs assistance/impaired                                     Functional mobility during ADLs: Contact guard assist General ADL Comments: see shoulder section below; educated pt/wife on use of toilet tong adn flushable wipes to help with hygiene after toileting; educated on availability and use of reacher, sock aid and long handled sponge to assist with LB B/D     Vision         Perception         Praxis         Pertinent Vitals/Pain  No c/o pain     Extremity/Trunk Assessment Upper Extremity Assessment Upper Extremity Assessment: LUE deficits/detail LUE Deficits / Details: L block active; PROM wrist/hand [facility]       Cervical / Trunk Assessment Cervical / Trunk Assessment: Back Surgery;Other exceptions (increased body habitus)   Communication     Cognition  General Comments       Exercises Exercises: Shoulder Shoulder Exercises Elbow Flexion: Left, 5 reps, PROM Elbow Extension: Left, 5 reps, PROM Wrist Flexion: Left, 5 reps, PROM Wrist Extension: Left, 5 reps, PROM Digit Composite Flexion: Left, 5 reps, PROM Composite Extension: 5 reps, Left, PROM Neck Flexion: AROM, 5 reps Neck Extension: AROM, 5 reps Neck Lateral Flexion - Right: AROM, 5 reps Neck Lateral Flexion - Left: AROM, 5 reps   Shoulder Instructions Shoulder Instructions Donning/doffing shirt without moving shoulder: Moderate assistance Method for sponge bathing under operated UE: Moderate assistance Donning/doffing sling/immobilizer: Moderate assistance Correct  positioning of sling/immobilizer: Moderate assistance ROM for elbow, wrist and digits of operated UE: Caregiver independent with task;Patient able to independently direct caregiver Sling wearing schedule (on at all times/off for ADL's): Caregiver independent with task Positioning of UE while sleeping: Caregiver independent with task;Patient able to independently direct caregiver    Home Living Family/patient expects to be discharged to:: Private residence Living Arrangements: Spouse/significant other Available Help at Discharge: Family Type of Home: House Home Access: Stairs to enter Secretary/administrator of Steps: 3 Entrance Stairs-Rails: Left Home Layout: One level     Bathroom Shower/Tub: Chief Strategy Officer: Handicapped height Bathroom Accessibility: Yes How Accessible: Accessible via walker Home Equipment: Agricultural Consultant (2 wheels);Crutches;Cane - single point;BSC/3in1;Cane - quad;Shower seat          Prior Functioning/Environment Prior Level of Function : Needs assist       Physical Assist : ADLs (physical)   ADLs (physical): Dressing;Toileting;IADLs            OT Problem List: Decreased strength;Decreased range of motion;Impaired UE functional use;Obesity      OT Treatment/Interventions:      OT Goals(Current goals can be found in the care plan section) Acute Rehab OT Goals Patient Stated Goal: home OT Goal Formulation: All assessment and education complete, DC therapy  OT Frequency:      Co-evaluation              AM-PAC OT 6 Clicks Daily Activity     Outcome Measure Help from another person eating meals?: A Little Help from another person taking care of personal grooming?: A Little Help from another person toileting, which includes using toliet, bedpan, or urinal?: A Lot Help from another person bathing (including washing, rinsing, drying)?: A Lot Help from another person to put on and taking off regular upper body clothing?: A  Lot Help from another person to put on and taking off regular lower body clothing?: A Lot 6 Click Score: 14   End of Session Nurse Communication:  (ready for DC)  Activity Tolerance: Patient tolerated treatment well Patient left: in chair;with family/visitor present  OT Visit Diagnosis: Muscle weakness (generalized) (M62.81)                Time: 8694-8652 OT Time Calculation (min): 42 min Charges:  OT General Charges $OT Visit: 1 Visit OT Evaluation $OT Eval Low Complexity: 1 Low OT Treatments $Self Care/Home Management : 8-22 mins $Therapeutic Activity: 8-22 mins  Kreg Sink, OT/L   Acute OT Clinical Specialist Acute Rehabilitation Services Pager [phone] Office [phone]   [facility] 04/26/2023, 4:17 PM

## 2023-04-26 NOTE — Anesthesia Procedure Notes (Signed)
 Anesthesia Regional Block: Interscalene brachial plexus block   Pre-Anesthetic Checklist: , timeout performed,  Correct Patient, Correct Site, Correct Laterality,  Correct Procedure, Correct Position, site marked,  Risks and benefits discussed,  Surgical consent,  Pre-op evaluation,  At surgeon's request and post-op pain management  Laterality: Left  Prep: Dura Prep       Needles:  Injection technique: Single-shot  Needle Type: Echogenic Stimulator Needle     Needle Length: 5cm  Needle Gauge: 20     Additional Needles:   Procedures:,,,, ultrasound used (permanent image in chart),,    Narrative:  Start time: 04/26/2023 7:40 AM End time: 04/26/2023 7:44 AM Injection made incrementally with aspirations every 5 mL.  Performed by: Personally  Anesthesiologist: Dorethea Cordella SQUIBB, DO  Additional Notes: Patient identified. Risks/Benefits/Options discussed with patient including but not limited to bleeding, infection, nerve damage, failed block, incomplete pain control. Patient expressed understanding and wished to proceed. All questions were answered. Sterile technique was used throughout the entire procedure. Please see nursing notes for vital signs. Aspirated in 5cc intervals with injection for negative confirmation. Patient was given instructions on fall risk and not to get out of bed. All questions and concerns addressed with instructions to call with any issues or inadequate analgesia.

## 2023-04-26 NOTE — Op Note (Signed)
 Procedure(s): REVERSE SHOULDER ARTHROPLASTY Procedure Note  [name] male 68 y.o. 04/26/2023  Preoperative diagnosis: Left shoulder end-stage rotator cuff tear arthropathy  Postoperative diagnosis: Same  Procedure(s) and Anesthesia Type:    * REVERSE SHOULDER ARTHROPLASTY - General   Indications:  68 y.o. male  With endstage left shoulder arthritis with irrepairable rotator cuff tear. Pain and dysfunction interfered with quality of life and nonoperative treatment with activity modification, NSAIDS and injections failed.     Surgeon: Josefa LELON Herring   Assistants: Jeoffrey Northern PA-C Amber was present and scrubbed throughout the procedure and was essential in positioning, retraction, exposure, and closure)  Anesthesia: General endotracheal anesthesia with preoperative interscalene block given by the attending anesthesiologist    Procedure Detail  REVERSE SHOULDER ARTHROPLASTY   Estimated Blood Loss:  200 mL         Drains: none  Blood Given: none          Specimens: none        Complications:  * No complications entered in OR log *         Disposition: PACU - hemodynamically stable.         Condition: stable      OPERATIVE FINDINGS:  A DJO Altivate pressfit reverse total shoulder arthroplasty was placed with a  size 10 stem, a 36 standard glenosphere, and a +4-mm poly insert. The base plate  fixation was excellent.  PROCEDURE: The patient was identified in the preoperative holding area  where I personally marked the operative site after verifying site, side,  and procedure with the patient. An interscalene block given by  the attending anesthesiologist in the holding area and the patient was taken back to the operating room where all extremities were  carefully padded in position after general anesthesia was induced. She  was placed in a beach-chair position and the operative upper extremity was  prepped and draped in a standard sterile fashion. An  approximately 10-  cm incision was made from the tip of the coracoid process to the center  point of the humerus at the level of the axilla. Dissection was carried  down through subcutaneous tissues to the level of the cephalic vein  which was taken laterally with the deltoid. The pectoralis major was  retracted medially. The subdeltoid space was developed and the lateral  edge of the conjoined tendon was identified. The undersurface of  conjoined tendon was palpated and the musculocutaneous nerve was not in  the field. Retractor was placed underneath the conjoined and second  retractor was placed lateral into the deltoid. The circumflex humeral  artery and vessels were identified and clamped and coagulated. The  biceps tendon was absent.  The subscapularis was severely degenerated and taken down as a peel.  The  joint was then gently externally rotated while the capsule was released  from the humeral neck around to just beyond the 6 o'clock position. At  this point, the joint was dislocated and the humeral head was presented  into the wound. The excessive osteophyte formation was removed with a  large rongeur.  The cutting guide was used to make the appropriate  head cut and the head was saved for potentially bone grafting.  The glenoid was exposed with the arm in an  abducted extended position. The anterior and posterior labrum were  completely excised and the capsule was released circumferentially to  allow for exposure of the glenoid for preparation. The 2.5 mm drill was  placed using  the guide in 5-10 inferior angulation and the tap was then advanced in the same hole. Small and large reamers were then used. The tap was then removed and the Metaglene was then screwed in with excellent purchase.  The peripheral guide was then used to drilled measured and filled peripheral locking screws. The size 36 glenosphere was then impacted on the [facility] taper and the central screw was placed. The  humerus was then again exposed and the diaphyseal reamers were used followed by the metaphyseal reamers. The final broach was left in place in the proximal trial was placed. The joint was reduced and with this implant it was felt that soft tissue tensioning was appropriate with excellent stability and excellent range of motion. Therefore, final humeral stem was placed press-fit.  And then the trial polyethylene inserts were tested again and the above implant was felt to be the most appropriate for final insertion. The joint was reduced taken through full range of motion and felt to be stable. Soft tissue tension was appropriate.  The joint was then copiously irrigated with pulse  lavage and the wound was then closed. The subscapularis was not repaired.  Skin was closed with 2-0 Vicryl in a deep dermal layer and 4-0  Monocryl for skin closure. Steri-Strips were applied. Sterile  dressings were then applied as well as a sling. The patient was allowed  to awaken from general anesthesia, transferred to stretcher, and taken  to recovery room in stable condition.   POSTOPERATIVE PLAN: The patient will be observed in the recovery room and if his pain is well-controlled with the regional anesthesia and he is hemodynamically stable after surgery he can be discharged home with family.

## 2023-04-26 NOTE — H&P (Signed)
 [name] is an 68 y.o. male.   Chief Complaint: L shoulder pain and dysfunction HPI: Endstage L shoulder rotator cuff tear arthopathy with significant pain and dysfunction, failed conservative measures.  Pain interferes with sleep and quality of life.   Past Medical History:  Diagnosis Date   Arthritis    back   Chronic lower back pain    Hyperlipemia    Hypertension    OSA on CPAP    settings 14   Right hydrocele    Wears contact lenses     Past Surgical History:  Procedure Laterality Date   ANTERIOR CERVICAL DECOMP/DISCECTOMY FUSION  04/17/1997   C5-6   CARDIOVASCULAR STRESS TEST  06/23/2010   normal lexiscan  nuclear study/  normal LV function and wall motion, ef 60%   CARPAL TUNNEL RELEASE Left 05/02/2017   Procedure: LEFT CARPAL TUNNEL RELEASE;  Surgeon: Joshua Alm RAMAN, MD;  Location: [facility] OR;  Service: Neurosurgery;  Laterality: Left;   COLONOSCOPY     HYDROCELE EXCISION Right 08/25/2015   Procedure: RIGHT HYDROCELE REPAIR;  Surgeon: Alm Fragmin, MD;  Location: [facility];  Service: Urology;  Laterality: Right;   KNEE ARTHROSCOPY Bilateral    KNEE ARTHROSCOPY W/ SYNOVECTOMY Left 01-29-2007  &  re-do 02-05-2007   MAXIMUM ACCESS (MAS)POSTERIOR LUMBAR INTERBODY FUSION (PLIF) 2 LEVEL N/A 06/17/2015   Procedure: LUMBAR FOUR-LUMBAR FIVE MAXIMUM ACCESS (MAS) POSTERIOR LUMBAR INTERBODY FUSION (PLIF) WITH LUMBAR ONE-TWO, LUMBAR FOUR-FIVE LAMINECTOMY ;  Surgeon: Alm RAMAN Joshua, MD;  Location: [facility];  Service: Neurosurgery;  Laterality: N/A;   POSTERIOR CERVICAL FUSION/FORAMINOTOMY N/A 05/02/2017   Procedure: Cervical three-six Posterior cervical fusion with lateral mass fixation, Cervical three-four laminectomy;  Surgeon: Joshua Alm RAMAN, MD;  Location: [facility] OR;  Service: Neurosurgery;  Laterality: N/A;   SCROTAL EXPLORATION N/A 08/25/2015   Procedure: SCROTUM EXPLORATION;  Surgeon: Alm Fragmin, MD;  Location: [facility];  Service: Urology;   Laterality: N/A;   SPERMATOCELECTOMY N/A 08/25/2015   Procedure: SPERMATOCELE EXCISION;  Surgeon: Alm Fragmin, MD;  Location: [facility];  Service: Urology;  Laterality: N/A;   TONSILLECTOMY  as child   TOTAL HIP ARTHROPLASTY  05/01/2011   Procedure: TOTAL HIP ARTHROPLASTY;  Surgeon: Dempsey JINNY Sensor;  Location: [facility] OR;  Service: Orthopedics;  Laterality: Left;  DEPUY   TOTAL HIP ARTHROPLASTY Right 04/17/1993   TOTAL KNEE ARTHROPLASTY Bilateral right 04-13-2008/  left 03-18-2007   TRANSTHORACIC ECHOCARDIOGRAM  11/20/2006   mild LVH, 55-60%/  mild AV sclerosis without stenosis/  trace MR    Family History  Problem Relation Age of Onset   Heart failure Mother    Heart disease Father    Diabetes Father    Social History:  reports that he has never smoked. He has never used smokeless tobacco. He reports that he does not drink alcohol and does not use drugs.  Allergies:  Allergies  Allergen Reactions   Other Nausea And Vomiting    CILANTRO    Keflex  [Cephalexin ] Nausea And Vomiting    Medications Prior to Admission  Medication Sig Dispense Refill   amLODipine (NORVASC) 10 MG tablet Take 10 mg by mouth daily.     aspirin  325 MG EC tablet Take 325 mg by mouth daily.     atorvastatin (LIPITOR) 40 MG tablet Take 40 mg by mouth every morning.      Cholecalciferol (VITAMIN D) 50 MCG (2000 UT) tablet Take 2,000 Units by mouth daily.  Coenzyme Q10 (COQ10) 100 MG CAPS Take 200 mg by mouth daily.     diphenhydramine -acetaminophen  (TYLENOL  PM) 25-500 MG TABS Take 2 tablets by mouth at bedtime.      methocarbamol  (ROBAXIN ) 750 MG tablet Take 325 mg by mouth every 8 (eight) hours as needed for muscle spasms. Takes 0.5 tablet  0   metoprolol  succinate (TOPROL -XL) 100 MG 24 hr tablet Take 150 mg by mouth daily.     Multiple Vitamins-Minerals (MULTIVITAMINS THER. W/MINERALS) TABS Take 1 tablet by mouth daily.       olmesartan-hydrochlorothiazide  (BENICAR HCT) 40-25 MG tablet Take 1  tablet by mouth daily.     oxyCODONE -acetaminophen  (PERCOCET) 10-325 MG tablet Take 1 tablet by mouth every 4 (four) hours as needed for pain.     oxymetazoline (AFRIN) 0.05 % nasal spray Place 1 spray into both nostrils at bedtime as needed for congestion.     sildenafil (VIAGRA) 100 MG tablet Take 100 mg by mouth daily as needed for erectile dysfunction.     spironolactone (ALDACTONE) 25 MG tablet Take 25 mg by mouth daily.      No results found for this or any previous visit (from the past 48 hours). No results found.  Review of Systems  All other systems reviewed and are negative.   There were no vitals taken for this visit. Physical Exam Constitutional:      Appearance: He is well-developed.  HENT:     Head: Atraumatic.  Eyes:     Extraocular Movements: Extraocular movements intact.  Cardiovascular:     Pulses: Normal pulses.  Pulmonary:     Effort: Pulmonary effort is normal.  Musculoskeletal:     Comments: L shoulder pain with limited ROM. NVID.  Skin:    General: Skin is warm and dry.  Neurological:     Mental Status: He is alert and oriented to person, place, and time.  Psychiatric:        Mood and Affect: Mood normal.      Assessment/Plan L shoulder rotator cuff tear arthopathy, failed conservative mgmnt Plan L reverse TSA Risks / benefits of surgery discussed Consent on chart  NPO for OR Preop antibiotics   Josefa LELON Herring, MD 04/26/2023, 7:08 AM

## 2023-04-26 NOTE — Discharge Instructions (Addendum)
 Discharge Instructions after Reverse Total Shoulder Arthroplasty   A sling has been provided for you. You are to wear this at all times (except for bathing and dressing), until your first post operative visit with Dr. Dozier. Please also wear while sleeping at night. While you bath and dress, let the arm/elbow extend straight down to stretch your elbow. Wiggle your fingers and pump your first while your in the sling to prevent hand swelling. Use ice on the shoulder intermittently over the first 48 hours after surgery. Continue to use ice or and ice machine as needed after 48 hours for pain control/swelling.  Take pain medication as prescribed by pain management Use your medicine liberally over the first 48 hours, and then you can begin to taper your use. You may take Extra Strength Tylenol  or Tylenol  only in place of the pain pills. DO NOT take ANY nonsteroidal anti-inflammatory pain medications: Advil, Motrin, Ibuprofen, Aleve, Naproxen or Naprosyn.  Resume aspirin  the day after surgery Leave your dressing on until your first follow up visit.  You may shower with the dressing.  Hold your arm as if you still have your sling on while you shower. Simply allow the water  to wash over the site and then pat dry. Make sure your axilla (armpit) is completely dry after showering. No driving    Please call [phone] during normal business hours or [phone] after hours for any problems. Including the following:  - excessive redness of the incisions - drainage for more than 4 days - fever of more than 101.5 F  *Please note that pain medications will not be refilled after hours or on weekends.  Dental Antibiotics:  In most cases prophylactic antibiotics for Dental procdeures after total joint surgery are not necessary.  Exceptions are as follows:  1. History of prior total joint infection  2. Severely immunocompromised (Organ Transplant, cancer chemotherapy, Rheumatoid biologic meds such  as Humera)  3. Poorly controlled diabetes (A1C &gt; 8.0, blood glucose over 200)  If you have one of these conditions, contact your surgeon for an antibiotic prescription, prior to your dental procedure.

## 2023-04-26 NOTE — Anesthesia Preprocedure Evaluation (Addendum)
 Anesthesia Evaluation  Patient identified by MRN, date of birth, ID band Patient awake    Reviewed: Allergy & Precautions, NPO status , Patient's Chart, lab work & pertinent test results  Airway Mallampati: II  TM Distance: >3 FB Neck ROM: Full    Dental no notable dental hx.    Pulmonary sleep apnea and Continuous Positive Airway Pressure Ventilation    Pulmonary exam normal        Cardiovascular hypertension, Pt. on medications and Pt. on home beta blockers  Rhythm:Regular Rate:Normal     Neuro/Psych negative neurological ROS  negative psych ROS   GI/Hepatic negative GI ROS, Neg liver ROS,,,  Endo/Other    Class 3 obesity  Renal/GU negative Renal ROS  negative genitourinary   Musculoskeletal  (+) Arthritis , Osteoarthritis,    Abdominal Normal abdominal exam  (+)   Peds  Hematology Lab Results      Component                Value               Date                      WBC                      9.2                 04/20/2023                HGB                      14.5                04/20/2023                HCT                      45.2                04/20/2023                MCV                      89.5                04/20/2023                PLT                      242                 04/20/2023              Anesthesia Other Findings   Reproductive/Obstetrics                             Anesthesia Physical Anesthesia Plan  ASA: 3  Anesthesia Plan: General and Regional   Post-op Pain Management: Regional block*   Induction: Intravenous  PONV Risk Score and Plan: 2 and Ondansetron , Dexamethasone , Midazolam  and Treatment may vary due to age or medical condition  Airway Management Planned: Mask and Oral ETT  Additional Equipment: None  Intra-op Plan:   Post-operative Plan: Extubation in OR  Informed Consent: I have reviewed the patients History and Physical, chart, labs  and discussed the procedure including  the risks, benefits and alternatives for the proposed anesthesia with the patient or authorized representative who has indicated his/her understanding and acceptance.     Dental advisory given  Plan Discussed with: CRNA  Anesthesia Plan Comments:        Anesthesia Quick Evaluation

## 2023-04-27 ENCOUNTER — Encounter (HOSPITAL_COMMUNITY): Payer: Self-pay | Admitting: Orthopedic Surgery

## 2023-07-26 ENCOUNTER — Other Ambulatory Visit: Payer: Self-pay | Admitting: Orthopedic Surgery

## 2023-09-06 NOTE — Care Plan (Signed)
 Ortho Bundle Case Management Note  Patient Details  Name: [name] MRN: [id number] Date of Birth: 1955-08-30  spoke with patient on the phone. he will discharge to home with family to assist. no DME needed. OPPT set up with [facility] Discharge instructions mailed and discussed. medications discussed and questions answered. Patient and MD in agreement. Choice offered.                     DME Arranged:    DME Agency:     HH Arranged:    HH Agency:     Additional Comments: Please contact me with any questions of if this plan should need to change.  Cornelia Dieter,  RN,BSN,MHA,CCM  [facility] Orthopaedic Specialist  [phone] 09/06/2023, 2:07 PM

## 2023-09-12 NOTE — Patient Instructions (Signed)
 SURGICAL WAITING ROOM VISITATION  Patients having surgery or a procedure may have no more than 2 support people in the waiting area - these visitors may rotate.    Children under the age of 42 must have an adult with them who is not the patient.  Due to an increase in RSV and influenza rates and associated hospitalizations, children ages 5 and under may not visit patients in [facility] hospitals.  Visitors with respiratory illnesses are discouraged from visiting and should remain at home.  If the patient needs to stay at the hospital during part of their recovery, the visitor guidelines for inpatient rooms apply. Pre-op nurse will coordinate an appropriate time for 1 support person to accompany patient in pre-op.  This support person may not rotate.    Please refer to the [facility] website for the visitor guidelines for Inpatients (after your surgery is over and you are in a regular room).       Your procedure is scheduled on: 09/20/23   Report to [facility] Main Entrance    Report to admitting at : 7:30 AM   Call this number if you have problems the morning of surgery [phone]   Do not eat food :After Midnight.   After Midnight you may have the following liquids until : 7:00 AM DAY OF SURGERY  Water Non-Citrus Juices (without pulp, NO RED-Apple, White grape, White cranberry) Black Coffee (NO MILK/CREAM OR CREAMERS, sugar ok)  Clear Tea (NO MILK/CREAM OR CREAMERS, sugar ok) regular and decaf                             Plain Jell-O (NO RED)                                           Fruit ices (not with fruit pulp, NO RED)                                     Popsicles (NO RED)                                                               Sports drinks like Gatorade (NO RED)   The day of surgery:  Drink ONE (1) Pre-Surgery Clear Ensure at : 7:00 AM the morning of surgery. Drink in one sitting. Do not sip.  This drink was given to you during your hospital  pre-op  appointment visit. Nothing else to drink after completing the  Pre-Surgery Clear Ensure or G2.          If you have questions, please contact your surgeon's office.  FOLLOW ANY ADDITIONAL PRE OP INSTRUCTIONS YOU RECEIVED FROM YOUR SURGEON'S OFFICE!!!   Oral Hygiene is also important to reduce your risk of infection.                                    Remember - BRUSH YOUR TEETH THE MORNING OF SURGERY WITH YOUR REGULAR TOOTHPASTE  DENTURES WILL BE REMOVED PRIOR TO SURGERY PLEASE DO NOT APPLY "Poly grip" OR ADHESIVES!!!   Do NOT smoke after Midnight   Stop all vitamins and herbal supplements 7 days before surgery.   Take these medicines the morning of surgery with A SIP OF WATER: metoprolol ,amlodipine.                              You may not have any metal on your body including hair pins, jewelry, and body piercing             Do not wear lotions, powders, perfumes/cologne, or deodorant              Men may shave face and neck.   Do not bring valuables to the hospital. [facility] IS NOT             RESPONSIBLE   FOR VALUABLES.   Contacts, glasses, dentures or bridgework may not be worn into surgery.   Bring small overnight bag day of surgery.   DO NOT BRING YOUR HOME MEDICATIONS TO THE HOSPITAL. PHARMACY WILL DISPENSE MEDICATIONS LISTED ON YOUR MEDICATION LIST TO YOU DURING YOUR ADMISSION IN THE HOSPITAL!    Patients discharged on the day of surgery will not be allowed to drive home.  Someone NEEDS to stay with you for the first 24 hours after anesthesia.   Special Instructions: Bring a copy of your healthcare power of attorney and living will documents the day of surgery if you haven't scanned them before.              Please read over the following fact sheets you were given: IF YOU HAVE QUESTIONS ABOUT YOUR PRE-OP INSTRUCTIONS PLEASE CALL [phone]   If you received a COVID test during your pre-op visit  it is requested that you wear a mask when out in public, stay  away from anyone that may not be feeling well and notify your surgeon if you develop symptoms. If you test positive for Covid or have been in contact with anyone that has tested positive in the last 10 days please notify you surgeon.    [facility]- Preparing for Total Shoulder Arthroplasty    Before surgery, you can play an important role. Because skin is not sterile, your skin needs to be as free of germs as possible. You can reduce the number of germs on your skin by using the following products. Benzoyl Peroxide Gel Reduces the number of germs present on the skin Applied twice a day to shoulder area starting two days before surgery    ==================================================================  Please follow these instructions carefully:  BENZOYL PEROXIDE 5% GEL  Please do not use if you have an allergy to benzoyl peroxide.   If your skin becomes reddened/irritated stop using the benzoyl peroxide.  Starting two days before surgery, apply as follows: Apply benzoyl peroxide in the morning and at night. Apply after taking a shower. If you are not taking a shower clean entire shoulder front, back, and side along with the armpit with a clean wet washcloth.  Place a quarter-sized dollop on your shoulder and rub in thoroughly, making sure to cover the front, back, and side of your shoulder, along with the armpit.   2 days before ____ AM   ____ PM              1 day before ____ AM   ____ PM  Do this twice a day for two days.  (Last application is the night before surgery, AFTER using the CHG soap as described below).  Do NOT apply benzoyl peroxide gel on the day of surgery.  Pre-operative 5 CHG Bath Instructions   You can play a key role in reducing the risk of infection after surgery. Your skin needs to be as free of germs as possible. You can reduce the number of germs on your skin by washing with CHG (chlorhexidine  gluconate) soap before surgery. CHG is an  antiseptic soap that kills germs and continues to kill germs even after washing.   DO NOT use if you have an allergy to chlorhexidine /CHG or antibacterial soaps. If your skin becomes reddened or irritated, stop using the CHG and notify one of our RNs at [phone].   Please shower with the CHG soap starting 4 days before surgery using the following schedule:     Please keep in mind the following:  DO NOT shave, including legs and underarms, starting the day of your first shower.   You may shave your face at any point before/day of surgery.  Place clean sheets on your bed the day you start using CHG soap. Use a clean washcloth (not used since being washed) for each shower. DO NOT sleep with pets once you start using the CHG.   CHG Shower Instructions:  If you choose to wash your hair and private area, wash first with your normal shampoo/soap.  After you use shampoo/soap, rinse your hair and body thoroughly to remove shampoo/soap residue.  Turn the water OFF and apply about 3 tablespoons (45 ml) of CHG soap to a CLEAN washcloth.  Apply CHG soap ONLY FROM YOUR NECK DOWN TO YOUR TOES (washing for 3-5 minutes)  DO NOT use CHG soap on face, private areas, open wounds, or sores.  Pay special attention to the area where your surgery is being performed.  If you are having back surgery, having someone wash your back for you may be helpful. Wait 2 minutes after CHG soap is applied, then you may rinse off the CHG soap.  Pat dry with a clean towel  Put on clean clothes/pajamas   If you choose to wear lotion, please use ONLY the CHG-compatible lotions on the back of this paper.     Additional instructions for the day of surgery: DO NOT APPLY any lotions, deodorants, cologne, or perfumes.   Put on clean/comfortable clothes.  Brush your teeth.  Ask your nurse before applying any prescription medications to the skin.   CHG Compatible Lotions   Aveeno Moisturizing lotion  Cetaphil Moisturizing  Cream  Cetaphil Moisturizing Lotion  Clairol Herbal Essence Moisturizing Lotion, Dry Skin  Clairol Herbal Essence Moisturizing Lotion, Extra Dry Skin  Clairol Herbal Essence Moisturizing Lotion, Normal Skin  Curel Age Defying Therapeutic Moisturizing Lotion with Alpha Hydroxy  Curel Extreme Care Body Lotion  Curel Soothing Hands Moisturizing Hand Lotion  Curel Therapeutic Moisturizing Cream, Fragrance-Free  Curel Therapeutic Moisturizing Lotion, Fragrance-Free  Curel Therapeutic Moisturizing Lotion, Original Formula  Eucerin Daily Replenishing Lotion  Eucerin Dry Skin Therapy Plus Alpha Hydroxy Crme  Eucerin Dry Skin Therapy Plus Alpha Hydroxy Lotion  Eucerin Original Crme  Eucerin Original Lotion  Eucerin Plus Crme Eucerin Plus Lotion  Eucerin TriLipid Replenishing Lotion  Keri Anti-Bacterial Hand Lotion  Keri Deep Conditioning Original Lotion Dry Skin Formula Softly Scented  Keri Deep Conditioning Original Lotion, Fragrance Free Sensitive Skin Formula  Keri Lotion Fast Absorbing Fragrance Free  Sensitive Skin Formula  Keri Lotion Fast Absorbing Softly Scented Dry Skin Formula  Keri Original Lotion  Keri Skin Renewal Lotion Keri Silky Smooth Lotion  Keri Silky Smooth Sensitive Skin Lotion  Nivea Body Creamy Conditioning Oil  Nivea Body Extra Enriched Lotion  Nivea Body Original Lotion  Nivea Body Sheer Moisturizing Lotion Nivea Crme  Nivea Skin Firming Lotion  NutraDerm 30 Skin Lotion  NutraDerm Skin Lotion  NutraDerm Therapeutic Skin Cream  NutraDerm Therapeutic Skin Lotion  ProShield Protective Hand Cream  Provon moisturizing lotion   Incentive Spirometer  An incentive spirometer is a tool that can help keep your lungs clear and active. This tool measures how well you are filling your lungs with each breath. Taking long deep breaths may help reverse or decrease the chance of developing breathing (pulmonary) problems (especially infection) following: A long period of  time when you are unable to move or be active. BEFORE THE PROCEDURE  If the spirometer includes an indicator to show your best effort, your nurse or respiratory therapist will set it to a desired goal. If possible, sit up straight or lean slightly forward. Try not to slouch. Hold the incentive spirometer in an upright position. INSTRUCTIONS FOR USE  Sit on the edge of your bed if possible, or sit up as far as you can in bed or on a chair. Hold the incentive spirometer in an upright position. Breathe out normally. Place the mouthpiece in your mouth and seal your lips tightly around it. Breathe in slowly and as deeply as possible, raising the piston or the ball toward the top of the column. Hold your breath for 3-5 seconds or for as long as possible. Allow the piston or ball to fall to the bottom of the column. Remove the mouthpiece from your mouth and breathe out normally. Rest for a few seconds and repeat Steps 1 through 7 at least 10 times every 1-2 hours when you are awake. Take your time and take a few normal breaths between deep breaths. The spirometer may include an indicator to show your best effort. Use the indicator as a goal to work toward during each repetition. After each set of 10 deep breaths, practice coughing to be sure your lungs are clear. If you have an incision (the cut made at the time of surgery), support your incision when coughing by placing a pillow or rolled up towels firmly against it. Once you are able to get out of bed, walk around indoors and cough well. You may stop using the incentive spirometer when instructed by your caregiver.  RISKS AND COMPLICATIONS Take your time so you do not get dizzy or light-headed. If you are in pain, you may need to take or ask for pain medication before doing incentive spirometry. It is harder to take a deep breath if you are having pain. AFTER USE Rest and breathe slowly and easily. It can be helpful to keep track of a log of your  progress. Your caregiver can provide you with a simple table to help with this. If you are using the spirometer at home, follow these instructions: SEEK MEDICAL CARE IF:  You are having difficultly using the spirometer. You have trouble using the spirometer as often as instructed. Your pain medication is not giving enough relief while using the spirometer. You develop fever of 100.5 F (38.1 C) or higher. SEEK IMMEDIATE MEDICAL CARE IF:  You cough up bloody sputum that had not been present before. You develop fever of 102  F (38.9 C) or greater. You develop worsening pain at or near the incision site. MAKE SURE YOU:  Understand these instructions. Will watch your condition. Will get help right away if you are not doing well or get worse. Document Released: 08/14/2006 Document Revised: 06/26/2011 Document Reviewed: 10/15/2006 [facility] Patient Information 2014 [place], [place].   ________________________________________________________________________

## 2023-09-13 ENCOUNTER — Encounter (HOSPITAL_COMMUNITY): Payer: Self-pay

## 2023-09-13 ENCOUNTER — Other Ambulatory Visit: Payer: Self-pay

## 2023-09-13 ENCOUNTER — Encounter (HOSPITAL_COMMUNITY)
Admission: RE | Admit: 2023-09-13 | Discharge: 2023-09-13 | Disposition: A | Discharge status: Home / Self Care | Source: Ambulatory Visit | Attending: Orthopedic Surgery | Admitting: Orthopedic Surgery

## 2023-09-13 VITALS — BP 144/80 | HR 73 | Temp 98.0°F | Ht 72.0 in | Wt 321.0 lb

## 2023-09-13 DIAGNOSIS — Z01812 Encounter for preprocedural laboratory examination: Secondary | ICD-10-CM | POA: Diagnosis present

## 2023-09-13 DIAGNOSIS — I10 Essential (primary) hypertension: Secondary | ICD-10-CM | POA: Diagnosis not present

## 2023-09-13 DIAGNOSIS — Z01818 Encounter for other preprocedural examination: Secondary | ICD-10-CM

## 2023-09-13 NOTE — Progress Notes (Signed)
 For Anesthesia: PCP - Helyn Lobstein, MD  Cardiologist - N/A  Bowel Prep reminder:  Chest x-ray -  EKG - 10/07/22 Stress Test - 06/23/10 ECHO - 07/31/22 Cardiac Cath -  Pacemaker/ICD device last checked: Pacemaker orders received: Device Rep notified:  Spinal Cord Stimulator:  Sleep Study - Yes CPAP - Yes  Fasting Blood Sugar - N/A Checks Blood Sugar _____ times a day Date and result of last Hgb A1c-6.0: 08/22/23  Last dose of GLP1 agonist- N/A GLP1 instructions:   Last dose of SGLT-2 inhibitors- N/A SGLT-2 instructions:   Blood Thinner Instructions: Aspirin  Instructions: Aspirin  325 mg on hold since: 09/13/23 Last Dose:  Activity level: Can go up a flight of stairs and activities of daily living without stopping and without chest pain and/or shortness of breath      Unable to go up a flight of stairs without shortness of breath    Anesthesia review: Hx: HTN,OSA(CPAP),Pre-DIA.  Patient denies shortness of breath, fever, cough and chest pain at PAT appointment   Patient verbalized understanding of instructions that were given to them at the PAT appointment. Patient was also instructed that they will need to review over the PAT instructions again at home before surgery.

## 2023-09-14 LAB — SURGICAL PCR SCREEN
MRSA, PCR: POSITIVE — AB
Staphylococcus aureus: POSITIVE — AB

## 2023-09-14 NOTE — Progress Notes (Addendum)
PCR: + MRSA./+ STAPH 

## 2023-09-19 NOTE — Anesthesia Preprocedure Evaluation (Addendum)
 Anesthesia Evaluation  Patient identified by MRN, date of birth, ID band Patient awake    Reviewed: Allergy & Precautions, NPO status , Patient's Chart, lab work & pertinent test results  Airway Mallampati: III  TM Distance: >3 FB Neck ROM: Full    Dental  (+) Dental Advisory Given, Teeth Intact   Pulmonary sleep apnea and Continuous Positive Airway Pressure Ventilation    Pulmonary exam normal breath sounds clear to auscultation       Cardiovascular hypertension, Pt. on medications and Pt. on home beta blockers Normal cardiovascular exam Rhythm:Regular Rate:Normal  Echocardiogram 07/31/2022:  Normal LV systolic function with visual EF 55-60%. Left ventricle cavity is normal in size. Normal left ventricular wall thickness. Normal global wall motion. Doppler evidence of grade I (impaired) diastolic dysfunction, normal LAP. Calculated EF 53%.  Left atrial cavity is moderately dilated at 4.9 cm.  No mitral valve regurgitation. Mild calcification of the mitral valve annulus.  Structurally normal tricuspid valve with trace regurgitation. No evidence of pulmonary hypertension.  No prior available for comparison.     Neuro/Psych negative neurological ROS  negative psych ROS   GI/Hepatic negative GI ROS, Neg liver ROS,,,  Endo/Other    Class 3 obesity  Renal/GU negative Renal ROS     Musculoskeletal  (+) Arthritis , Osteoarthritis,    Abdominal  (+) + obese  Peds  Hematology Lab Results      Component                Value               Date                      WBC                      9.2                 04/20/2023                HGB                      14.5                04/20/2023                HCT                      45.2                04/20/2023                MCV                      89.5                04/20/2023                PLT                      242                 04/20/2023              Anesthesia Other  Findings   Reproductive/Obstetrics  Anesthesia Physical Anesthesia Plan  ASA: 3  Anesthesia Plan: General   Post-op Pain Management: Regional block* and Tylenol  PO (pre-op)*   Induction: Intravenous  PONV Risk Score and Plan: 2 and Ondansetron , Dexamethasone , Midazolam  and Treatment may vary due to age or medical condition  Airway Management Planned: Oral ETT and Video Laryngoscope Planned  Additional Equipment: None  Intra-op Plan:   Post-operative Plan: Extubation in OR  Informed Consent: I have reviewed the patients History and Physical, chart, labs and discussed the procedure including the risks, benefits and alternatives for the proposed anesthesia with the patient or authorized representative who has indicated his/her understanding and acceptance.     Dental advisory given  Plan Discussed with: CRNA  Anesthesia Plan Comments: (Risks of anesthesia explained at length. This includes, but is not limited to, sore throat, damage to teeth, lips gums, tongue and vocal cords, nausea and vomiting, reactions to medications, stroke, heart attack, and death. All patient questions were answered and the patient wishes to proceed. Risks of peripheral nerve block explained at length. This includes, but is not limited to, bleeding, infection, reactions to the medications, seizures, damage to surrounding structures, damage to nerves, permanent weakness, numbness, tingling and pain. All patient questions were answered and patient wishes to proceed with nerve block. )       Anesthesia Quick Evaluation

## 2023-09-20 ENCOUNTER — Other Ambulatory Visit: Payer: Self-pay

## 2023-09-20 ENCOUNTER — Encounter (HOSPITAL_COMMUNITY)
Admission: RE | Disposition: A | Discharge status: Home / Self Care | Payer: Self-pay | Source: Ambulatory Visit | Attending: Orthopedic Surgery

## 2023-09-20 ENCOUNTER — Ambulatory Visit (HOSPITAL_COMMUNITY)

## 2023-09-20 ENCOUNTER — Ambulatory Visit (HOSPITAL_COMMUNITY)
Admission: RE | Admit: 2023-09-20 | Discharge: 2023-09-20 | Disposition: A | Discharge status: Home / Self Care | Source: Ambulatory Visit | Attending: Orthopedic Surgery | Admitting: Orthopedic Surgery

## 2023-09-20 ENCOUNTER — Ambulatory Visit (HOSPITAL_BASED_OUTPATIENT_CLINIC_OR_DEPARTMENT_OTHER): Payer: Self-pay | Admitting: Anesthesiology

## 2023-09-20 ENCOUNTER — Ambulatory Visit (HOSPITAL_COMMUNITY): Payer: Self-pay | Admitting: Anesthesiology

## 2023-09-20 ENCOUNTER — Encounter (HOSPITAL_COMMUNITY): Payer: Self-pay | Admitting: Orthopedic Surgery

## 2023-09-20 DIAGNOSIS — M25711 Osteophyte, right shoulder: Secondary | ICD-10-CM | POA: Diagnosis not present

## 2023-09-20 DIAGNOSIS — G4733 Obstructive sleep apnea (adult) (pediatric): Secondary | ICD-10-CM

## 2023-09-20 DIAGNOSIS — M75101 Unspecified rotator cuff tear or rupture of right shoulder, not specified as traumatic: Secondary | ICD-10-CM | POA: Diagnosis not present

## 2023-09-20 DIAGNOSIS — M19011 Primary osteoarthritis, right shoulder: Secondary | ICD-10-CM | POA: Insufficient documentation

## 2023-09-20 DIAGNOSIS — Z6841 Body Mass Index (BMI) 40.0 and over, adult: Secondary | ICD-10-CM | POA: Diagnosis not present

## 2023-09-20 DIAGNOSIS — I10 Essential (primary) hypertension: Secondary | ICD-10-CM | POA: Diagnosis not present

## 2023-09-20 DIAGNOSIS — E66813 Obesity, class 3: Secondary | ICD-10-CM | POA: Diagnosis not present

## 2023-09-20 HISTORY — PX: REVERSE SHOULDER ARTHROPLASTY: SHX5054

## 2023-09-20 SURGERY — ARTHROPLASTY, SHOULDER, TOTAL, REVERSE
Anesthesia: Regional | Site: Shoulder | Laterality: Right

## 2023-09-20 MED ORDER — METHOCARBAMOL 500 MG PO TABS
500.0000 mg | ORAL_TABLET | Freq: Four times a day (QID) | ORAL | Status: DC | PRN
  Administered 2023-09-20: 500 mg via ORAL

## 2023-09-20 MED ORDER — WATER FOR IRRIGATION, STERILE IR SOLN
Status: DC | PRN
  Administered 2023-09-20: 2000 mL

## 2023-09-20 MED ORDER — SUCCINYLCHOLINE CHLORIDE 200 MG/10ML IV SOSY
PREFILLED_SYRINGE | INTRAVENOUS | Status: DC | PRN
  Administered 2023-09-20: 200 mg via INTRAVENOUS

## 2023-09-20 MED ORDER — LACTATED RINGERS IV SOLN: INTRAVENOUS | Status: DC

## 2023-09-20 MED ORDER — SODIUM CHLORIDE 0.9 % IR SOLN
Status: DC | PRN
  Administered 2023-09-20: 1000 mL

## 2023-09-20 MED ORDER — DEXAMETHASONE SODIUM PHOSPHATE 10 MG/ML IJ SOLN
INTRAMUSCULAR | Status: DC | PRN
  Administered 2023-09-20: 4 mg via INTRAVENOUS

## 2023-09-20 MED ORDER — ROCURONIUM BROMIDE 10 MG/ML (PF) SYRINGE
PREFILLED_SYRINGE | INTRAVENOUS | Status: AC
  Filled 2023-09-20: qty 10

## 2023-09-20 MED ORDER — PROPOFOL 10 MG/ML IV BOLUS
INTRAVENOUS | Status: DC | PRN
  Administered 2023-09-20: 200 mg via INTRAVENOUS

## 2023-09-20 MED ORDER — LIDOCAINE HCL (PF) 2 % IJ SOLN
INTRAMUSCULAR | Status: AC
  Filled 2023-09-20: qty 5

## 2023-09-20 MED ORDER — FENTANYL CITRATE (PF) 100 MCG/2ML IJ SOLN
INTRAMUSCULAR | Status: AC
Start: 2023-09-20 — End: ?
  Filled 2023-09-20: qty 2

## 2023-09-20 MED ORDER — DROPERIDOL 2.5 MG/ML IJ SOLN
0.6250 mg | Freq: Once | INTRAMUSCULAR | Status: DC | PRN
Start: 2023-09-20 — End: 2023-09-20

## 2023-09-20 MED ORDER — PROPOFOL 10 MG/ML IV BOLUS
INTRAVENOUS | Status: AC
  Filled 2023-09-20: qty 20

## 2023-09-20 MED ORDER — ROCURONIUM BROMIDE 50 MG/5ML IV SOSY
PREFILLED_SYRINGE | INTRAVENOUS | Status: DC | PRN
  Administered 2023-09-20: 40 mg via INTRAVENOUS
  Administered 2023-09-20: 20 mg via INTRAVENOUS

## 2023-09-20 MED ORDER — SUGAMMADEX SODIUM 200 MG/2ML IV SOLN
INTRAVENOUS | Status: DC | PRN
  Administered 2023-09-20: 300 mg via INTRAVENOUS

## 2023-09-20 MED ORDER — MUPIROCIN 2 % EX OINT: 1.0000 | TOPICAL_OINTMENT | Freq: Two times a day (BID) | CUTANEOUS | 0 refills | Status: AC

## 2023-09-20 MED ORDER — PHENYLEPHRINE HCL (PRESSORS) 10 MG/ML IV SOLN
INTRAVENOUS | Status: AC
  Filled 2023-09-20: qty 1

## 2023-09-20 MED ORDER — TRANEXAMIC ACID-NACL 1000-0.7 MG/100ML-% IV SOLN
1000.0000 mg | INTRAVENOUS | Status: AC
  Administered 2023-09-20: 1000 mg via INTRAVENOUS
  Filled 2023-09-20: qty 100

## 2023-09-20 MED ORDER — BUPIVACAINE HCL (PF) 0.5 % IJ SOLN
INTRAMUSCULAR | Status: DC | PRN
Start: 2023-09-20 — End: 2023-09-20
  Administered 2023-09-20: 10 mL via PERINEURAL

## 2023-09-20 MED ORDER — LIDOCAINE 2% (20 MG/ML) 5 ML SYRINGE
INTRAMUSCULAR | Status: DC | PRN
  Administered 2023-09-20: 100 mg via INTRAVENOUS

## 2023-09-20 MED ORDER — CHLORHEXIDINE GLUCONATE 4 % EX SOLN: 1.0000 | CUTANEOUS | 1 refills | Status: AC

## 2023-09-20 MED ORDER — TRANEXAMIC ACID 650 MG PO TABS
1950.0000 mg | ORAL_TABLET | ORAL | Status: DC
  Filled 2023-09-20: qty 3

## 2023-09-20 MED ORDER — CEFAZOLIN SODIUM 1 G IJ SOLR
INTRAMUSCULAR | Status: AC
  Filled 2023-09-20: qty 10

## 2023-09-20 MED ORDER — OXYCODONE HCL 10 MG PO TABS: 10.0000 mg | ORAL_TABLET | Freq: Every day | ORAL | 0 refills | Status: AC | PRN

## 2023-09-20 MED ORDER — ORAL CARE MOUTH RINSE: 15.0000 mL | Freq: Once | OROMUCOSAL | Status: AC

## 2023-09-20 MED ORDER — ONDANSETRON HCL 4 MG/2ML IJ SOLN
INTRAMUSCULAR | Status: DC | PRN
  Administered 2023-09-20: 4 mg via INTRAVENOUS

## 2023-09-20 MED ORDER — FENTANYL CITRATE PF 50 MCG/ML IJ SOSY
25.0000 ug | PREFILLED_SYRINGE | INTRAMUSCULAR | Status: DC | PRN
  Administered 2023-09-20 (×2): 25 ug via INTRAVENOUS

## 2023-09-20 MED ORDER — CHLORHEXIDINE GLUCONATE 0.12 % MT SOLN
15.0000 mL | Freq: Once | OROMUCOSAL | Status: AC
  Administered 2023-09-20: 15 mL via OROMUCOSAL

## 2023-09-20 MED ORDER — SUCCINYLCHOLINE CHLORIDE 200 MG/10ML IV SOSY
PREFILLED_SYRINGE | INTRAVENOUS | Status: AC
  Filled 2023-09-20: qty 10

## 2023-09-20 MED ORDER — BUPIVACAINE LIPOSOME 1.3 % IJ SUSP
INTRAMUSCULAR | Status: DC | PRN
Start: 2023-09-20 — End: 2023-09-20
  Administered 2023-09-20: 10 mL via PERINEURAL

## 2023-09-20 MED ORDER — LEVOFLOXACIN IN D5W 500 MG/100ML IV SOLN
500.0000 mg | INTRAVENOUS | Status: DC
  Filled 2023-09-20: qty 100

## 2023-09-20 MED ORDER — FENTANYL CITRATE PF 50 MCG/ML IJ SOSY
50.0000 ug | PREFILLED_SYRINGE | INTRAMUSCULAR | Status: DC
  Administered 2023-09-20: 50 ug via INTRAVENOUS
  Filled 2023-09-20: qty 2

## 2023-09-20 MED ORDER — DEXAMETHASONE SODIUM PHOSPHATE 10 MG/ML IJ SOLN
INTRAMUSCULAR | Status: AC
  Filled 2023-09-20: qty 1

## 2023-09-20 MED ORDER — METHOCARBAMOL 500 MG PO TABS
ORAL_TABLET | ORAL | Status: AC
Start: 2023-09-20 — End: ?
  Filled 2023-09-20: qty 1

## 2023-09-20 MED ORDER — VANCOMYCIN HCL 1500 MG/300ML IV SOLN
1500.0000 mg | INTRAVENOUS | Status: AC
  Administered 2023-09-20: 1500 mg via INTRAVENOUS
  Filled 2023-09-20: qty 300

## 2023-09-20 MED ORDER — PHENYLEPHRINE HCL-NACL 20-0.9 MG/250ML-% IV SOLN
INTRAVENOUS | Status: DC | PRN
Start: 2023-09-20 — End: 2023-09-20
  Administered 2023-09-20: 60 ug/min via INTRAVENOUS

## 2023-09-20 MED ORDER — ACETAMINOPHEN 500 MG PO TABS
1000.0000 mg | ORAL_TABLET | Freq: Once | ORAL | Status: DC
  Filled 2023-09-20: qty 2

## 2023-09-20 MED ORDER — PHENYLEPHRINE 80 MCG/ML (10ML) SYRINGE FOR IV PUSH (FOR BLOOD PRESSURE SUPPORT)
PREFILLED_SYRINGE | INTRAVENOUS | Status: DC | PRN
  Administered 2023-09-20: 80 ug via INTRAVENOUS
  Administered 2023-09-20 (×3): 160 ug via INTRAVENOUS
  Administered 2023-09-20: 80 ug via INTRAVENOUS
  Administered 2023-09-20: 160 ug via INTRAVENOUS

## 2023-09-20 MED ORDER — METHOCARBAMOL 1000 MG/10ML IJ SOLN: 500.0000 mg | Freq: Four times a day (QID) | INTRAMUSCULAR | Status: DC | PRN

## 2023-09-20 MED ORDER — CEFAZOLIN SODIUM-DEXTROSE 2-4 GM/100ML-% IV SOLN
INTRAVENOUS | Status: AC
  Filled 2023-09-20: qty 100

## 2023-09-20 MED ORDER — DEXTROSE 5 % IV SOLN
INTRAVENOUS | Status: DC | PRN
  Administered 2023-09-20: 3 g via INTRAVENOUS

## 2023-09-20 MED ORDER — MIDAZOLAM HCL 2 MG/2ML IJ SOLN: 1.0000 mg | INTRAMUSCULAR | Status: DC

## 2023-09-20 MED ORDER — ONDANSETRON HCL 4 MG/2ML IJ SOLN
INTRAMUSCULAR | Status: AC
  Filled 2023-09-20: qty 2

## 2023-09-20 MED ORDER — EPHEDRINE SULFATE-NACL 50-0.9 MG/10ML-% IV SOSY
PREFILLED_SYRINGE | INTRAVENOUS | Status: DC | PRN
  Administered 2023-09-20 (×2): 10 mg via INTRAVENOUS
  Administered 2023-09-20: 5 mg via INTRAVENOUS

## 2023-09-20 MED ORDER — PHENYLEPHRINE 80 MCG/ML (10ML) SYRINGE FOR IV PUSH (FOR BLOOD PRESSURE SUPPORT)
PREFILLED_SYRINGE | INTRAVENOUS | Status: AC
  Filled 2023-09-20: qty 10

## 2023-09-20 MED ORDER — FENTANYL CITRATE (PF) 250 MCG/5ML IJ SOLN
INTRAMUSCULAR | Status: DC | PRN
  Administered 2023-09-20: 100 ug via INTRAVENOUS

## 2023-09-20 MED ORDER — FENTANYL CITRATE PF 50 MCG/ML IJ SOSY
PREFILLED_SYRINGE | INTRAMUSCULAR | Status: AC
  Filled 2023-09-20: qty 1

## 2023-09-20 SURGICAL SUPPLY — 66 items
BAG COUNTER SPONGE SURGICOUNT (BAG) IMPLANT
BAG ZIPLOCK 12X15 (MISCELLANEOUS) ×1 IMPLANT
BASEPLATE P2 COATD GLND 6.5X30 (Shoulder) IMPLANT
BIT DRILL 2.5 DIA 127 CALI (BIT) IMPLANT
BLADE SAW SGTL 73X25 THK (BLADE) ×1 IMPLANT
BOOTIES KNEE HIGH SLOAN (MISCELLANEOUS) ×2 IMPLANT
COOLER ICEMAN CLASSIC (MISCELLANEOUS) ×1 IMPLANT
COVER BACK TABLE 60X90IN (DRAPES) ×1 IMPLANT
COVER SURGICAL LIGHT HANDLE (MISCELLANEOUS) ×1 IMPLANT
DRAPE INCISE IOBAN 66X45 STRL (DRAPES) ×1 IMPLANT
DRAPE POUCH INSTRU U-SHP 10X18 (DRAPES) ×1 IMPLANT
DRAPE SHEET LG 3/4 BI-LAMINATE (DRAPES) ×1 IMPLANT
DRAPE SURG 17X11 SM STRL (DRAPES) ×1 IMPLANT
DRAPE SURG ORHT 6 SPLT 77X108 (DRAPES) ×2 IMPLANT
DRAPE TOP 10253 STERILE (DRAPES) ×1 IMPLANT
DRAPE U-SHAPE 47X51 STRL (DRAPES) ×1 IMPLANT
DRILL GLEN ALTIVATE 3.5 (DRILL) IMPLANT
DRSG AQUACEL AG ADV 3.5X 6 (GAUZE/BANDAGES/DRESSINGS) ×1 IMPLANT
DURAPREP 26ML APPLICATOR (WOUND CARE) ×2 IMPLANT
ELECT BLADE TIP CTD 4 INCH (ELECTRODE) ×1 IMPLANT
ELECT PENCIL ROCKER SW 15FT (MISCELLANEOUS) ×1 IMPLANT
ELECT REM PT RETURN 15FT ADLT (MISCELLANEOUS) ×1 IMPLANT
FACESHIELD WRAPAROUND (MASK) ×1 IMPLANT
FACESHIELD WRAPAROUND OR TEAM (MASK) ×1 IMPLANT
GLOVE BIO SURGEON STRL SZ7.5 (GLOVE) ×1 IMPLANT
GLOVE BIOGEL PI IND STRL 6.5 (GLOVE) ×1 IMPLANT
GLOVE BIOGEL PI IND STRL 8 (GLOVE) ×1 IMPLANT
GLOVE SURG SS PI 6.5 STRL IVOR (GLOVE) ×1 IMPLANT
GOWN STRL REUS W/ TWL LRG LVL3 (GOWN DISPOSABLE) ×1 IMPLANT
GOWN STRL REUS W/ TWL XL LVL3 (GOWN DISPOSABLE) ×1 IMPLANT
HEAD GLENOID W/SCREW 32MM (Shoulder) IMPLANT
HOOD PEEL AWAY T7 (MISCELLANEOUS) ×3 IMPLANT
INSERT EPOLYSTD HUMERUS+4 36 (Shoulder) IMPLANT
KIT BASIN OR (CUSTOM PROCEDURE TRAY) ×1 IMPLANT
KIT TURNOVER KIT A (KITS) ×1 IMPLANT
MANIFOLD NEPTUNE II (INSTRUMENTS) ×1 IMPLANT
NDL TROCAR POINT SZ 2 1/2 (NEEDLE) IMPLANT
NEEDLE TROCAR POINT SZ 2 1/2 (NEEDLE) IMPLANT
NS IRRIG 1000ML POUR BTL (IV SOLUTION) ×1 IMPLANT
PACK SHOULDER (CUSTOM PROCEDURE TRAY) ×1 IMPLANT
PAD COLD SHLDR WRAP-ON (PAD) ×1 IMPLANT
RESTRAINT HEAD UNIVERSAL NS (MISCELLANEOUS) ×1 IMPLANT
RETRIEVER SUT HEWSON (MISCELLANEOUS) IMPLANT
SCREW BONE RSP LOCK 5X18 (Screw) IMPLANT
SCREW BONE RSP LOCK 5X22 (Screw) IMPLANT
SCREW BONE RSP LOCK 5X30 (Screw) IMPLANT
SCREW BONE RSP LOCK 5X34 (Screw) IMPLANT
SCREW MONOBLOCK SPACER RETAIN (Screw) IMPLANT
SET HNDPC FAN SPRY TIP SCT (DISPOSABLE) ×1 IMPLANT
SLING ARM FOAM STRAP LRG (SOFTGOODS) IMPLANT
SLING ARM FOAM STRAP MED (SOFTGOODS) IMPLANT
SLING ARM FOAM STRAP XLG (SOFTGOODS) IMPLANT
STEM HUMERAL STD SHELL 10X48 (Stem) IMPLANT
STRIP CLOSURE SKIN 1/2X4 (GAUZE/BANDAGES/DRESSINGS) ×1 IMPLANT
SUCTION TUBE FRAZIER 10FR DISP (SUCTIONS) IMPLANT
SUPPORT WRAP ARM LG (MISCELLANEOUS) ×1 IMPLANT
SUT ETHIBOND 2 V 37 (SUTURE) IMPLANT
SUT MNCRL AB 4-0 PS2 18 (SUTURE) ×1 IMPLANT
SUT VIC AB 2-0 CT1 TAPERPNT 27 (SUTURE) ×2 IMPLANT
SUTURE FIBERWR#2 38 REV NDL BL (SUTURE) IMPLANT
TAP SURG THRD DJ 6.5 (ORTHOPEDIC DISPOSABLE SUPPLIES) IMPLANT
TAPE LABRALWHITE 1.5X36 (TAPE) IMPLANT
TAPE SUT LABRALTAP WHT/BLK (SUTURE) IMPLANT
TOWEL OR 17X26 10 PK STRL BLUE (TOWEL DISPOSABLE) ×1 IMPLANT
TUBE SUCTION HIGH CAP CLEAR NV (SUCTIONS) ×1 IMPLANT
WATER STERILE IRR 1000ML POUR (IV SOLUTION) ×1 IMPLANT

## 2023-09-20 NOTE — Anesthesia Postprocedure Evaluation (Signed)
 Anesthesia Post Note  Patient: [name]  Procedure(s) Performed: ARTHROPLASTY, SHOULDER, TOTAL, REVERSE (Right: Shoulder)     Patient location during evaluation: PACU Anesthesia Type: General Level of consciousness: sedated and patient cooperative Pain management: pain level controlled Vital Signs Assessment: post-procedure vital signs reviewed and stable Respiratory status: spontaneous breathing Cardiovascular status: stable Anesthetic complications: no   No notable events documented.  Last Vitals:  Vitals:   09/20/23 1200 09/20/23 1215  BP: 116/76 115/75  Pulse: 94 89  Resp: 17 17  Temp:    SpO2: 92% 93%    Last Pain:  Vitals:   09/20/23 1215  TempSrc:   PainSc: 3                  Gorman Laughter

## 2023-09-20 NOTE — H&P (Signed)
 [name] is an 68 y.o. male.   Chief Complaint: R shoulder pain and dysfunction HPI: Endstage R shoulder arthritis with chronic rotator cuff disease significant pain and dysfunction, failed conservative measures.  Pain interferes with sleep and quality of life.   Past Medical History:  Diagnosis Date   Arthritis    back   Chronic lower back pain    Hyperlipemia    Hypertension    OSA on CPAP    settings 14   Right hydrocele    Wears contact lenses     Past Surgical History:  Procedure Laterality Date   ANTERIOR CERVICAL DECOMP/DISCECTOMY FUSION  04/17/1997   C5-6   CARDIOVASCULAR STRESS TEST  06/23/2010   normal lexiscan  nuclear study/  normal LV function and wall motion, ef 60%   CARPAL TUNNEL RELEASE Left 05/02/2017   Procedure: LEFT CARPAL TUNNEL RELEASE;  Surgeon: Isadora Mar, MD;  Location[facility] OR;  Service: Neurosurgery;  Laterality: Left;   COLONOSCOPY     HYDROCELE EXCISION Right 08/25/2015   Procedure: RIGHT HYDROCELE REPAIR;  Surgeon: Ann Barnacle, MD;  Location: [facility];  Service: Urology;  Laterality: Right;   KNEE ARTHROSCOPY Bilateral    KNEE ARTHROSCOPY W/ SYNOVECTOMY Left 01-29-2007  &  re-do 02-05-2007   MAXIMUM ACCESS (MAS)POSTERIOR LUMBAR INTERBODY FUSION (PLIF) 2 LEVEL N/A 06/17/2015   Procedure: LUMBAR FOUR-LUMBAR FIVE MAXIMUM ACCESS (MAS) POSTERIOR LUMBAR INTERBODY FUSION (PLIF) WITH LUMBAR ONE-TWO, LUMBAR FOUR-FIVE LAMINECTOMY ;  Surgeon: Isadora Mar, MD;  Location: [facility];  Service: Neurosurgery;  Laterality: N/A;   POSTERIOR CERVICAL FUSION/FORAMINOTOMY N/A 05/02/2017   Procedure: Cervical three-six Posterior cervical fusion with lateral mass fixation, Cervical three-four laminectomy;  Surgeon: Isadora Mar, MD;  Location: [facility] OR;  Service: Neurosurgery;  Laterality: N/A;   REVERSE SHOULDER ARTHROPLASTY Left 04/26/2023   Procedure: REVERSE SHOULDER ARTHROPLASTY;  Surgeon: Sammye Cristal, MD;  Location: [facility] ORS;  Service:  Orthopedics;  Laterality: Left;   SCROTAL EXPLORATION N/A 08/25/2015   Procedure: SCROTUM EXPLORATION;  Surgeon: Ann Barnacle, MD;  Location: [facility];  Service: Urology;  Laterality: N/A;   SPERMATOCELECTOMY N/A 08/25/2015   Procedure: SPERMATOCELE EXCISION;  Surgeon: Ann Barnacle, MD;  Location: [facility];  Service: Urology;  Laterality: N/A;   TONSILLECTOMY  as child   TOTAL HIP ARTHROPLASTY  05/01/2011   Procedure: TOTAL HIP ARTHROPLASTY;  Surgeon: Ilean Mall;  Location: [facility] OR;  Service: Orthopedics;  Laterality: Left;  DEPUY   TOTAL HIP ARTHROPLASTY Right 04/17/1993   TOTAL KNEE ARTHROPLASTY Bilateral right 04-13-2008/  left 03-18-2007   TRANSTHORACIC ECHOCARDIOGRAM  11/20/2006   mild LVH, 55-60%/  mild AV sclerosis without stenosis/  trace MR    Family History  Problem Relation Age of Onset   Heart failure Mother    Heart disease Father    Diabetes Father    Social History:  reports that he has never smoked. He has never been exposed to tobacco smoke. He has never used smokeless tobacco. He reports that he does not drink alcohol and does not use drugs.  Allergies:  Allergies  Allergen Reactions   Other Nausea And Vomiting    CILANTRO    Keflex  [Cephalexin ] Nausea And Vomiting    Medications Prior to Admission  Medication Sig Dispense Refill   amLODipine (NORVASC) 10 MG tablet Take 10 mg by mouth daily.     aspirin  325 MG EC tablet Take 325 mg by mouth daily.  atorvastatin (LIPITOR) 40 MG tablet Take 40 mg by mouth every morning.      celecoxib  (CELEBREX ) 200 MG capsule Take 200 mg by mouth 2 (two) times daily as needed for moderate pain (pain score 4-6).     Cholecalciferol (VITAMIN D) 50 MCG (2000 UT) tablet Take 2,000 Units by mouth daily.     Coenzyme Q10 (COQ10) 100 MG CAPS Take 200 mg by mouth daily.     diphenhydramine -acetaminophen  (TYLENOL  PM) 25-500 MG TABS Take 2 tablets by mouth at bedtime.      methocarbamol  (ROBAXIN )  750 MG tablet Take 325 mg by mouth every 8 (eight) hours as needed for muscle spasms. Takes 0.5 tablet  0   metoprolol  succinate (TOPROL -XL) 100 MG 24 hr tablet Take 150 mg by mouth daily.     Multiple Vitamins-Minerals (MULTIVITAMINS THER. W/MINERALS) TABS Take 1 tablet by mouth daily.       olmesartan-hydrochlorothiazide  (BENICAR HCT) 40-25 MG tablet Take 1 tablet by mouth daily.     oxyCODONE -acetaminophen  (PERCOCET) 10-325 MG tablet Take 1 tablet by mouth every 4 (four) hours as needed for pain.     oxymetazoline (AFRIN) 0.05 % nasal spray Place 1 spray into both nostrils at bedtime as needed for congestion.     sildenafil (VIAGRA) 100 MG tablet Take 100 mg by mouth daily as needed for erectile dysfunction.     spironolactone (ALDACTONE) 25 MG tablet Take 25 mg by mouth daily.      No results found for this or any previous visit (from the past 48 hours). No results found.  Review of Systems  All other systems reviewed and are negative.   Blood pressure (!) 149/80, pulse 85, temperature 98 F (36.7 C), temperature source Oral, resp. rate 18, height 6' (1.829 m), weight (!) 145.6 kg, SpO2 96%. Physical Exam Constitutional:      Appearance: He is well-developed.  HENT:     Head: Atraumatic.  Eyes:     Extraocular Movements: Extraocular movements intact.  Cardiovascular:     Pulses: Normal pulses.  Pulmonary:     Effort: Pulmonary effort is normal.  Musculoskeletal:     Comments: R shoulder pain with limited ROM, NVID  Skin:    General: Skin is warm and dry.  Neurological:     Mental Status: He is alert and oriented to person, place, and time.  Psychiatric:        Mood and Affect: Mood normal.      Assessment/Plan R shoulder rotator cuff tear arthropathy Plan R reverse TSA Risks / benefits of surgery discussed Consent on chart  NPO for OR Preop antibiotics   Audrene Blessing, MD 09/20/2023, 8:52 AM

## 2023-09-20 NOTE — Op Note (Signed)
 Procedure(s): ARTHROPLASTY, SHOULDER, TOTAL, REVERSE Procedure Note  [name] male 68 y.o. 09/20/2023  Preoperative diagnosis: Right shoulder rotator cuff tear arthropathy  Postoperative diagnosis: Same  Procedure(s) and Anesthesia Type:    * ARTHROPLASTY, SHOULDER, TOTAL, REVERSE - General   Indications:  68 y.o. male  With endstage right shoulder arthritis with irrepairable rotator cuff tear. Pain and dysfunction interfered with quality of life and nonoperative treatment with activity modification, NSAIDS and injections failed.     Surgeon: Audrene Blessing   Assistants: Sidra Dredge PA-C Amber was present and scrubbed throughout the procedure and was essential in positioning, retraction, exposure, and closure)  Anesthesia: General endotracheal anesthesia with preoperative interscalene block given by the attending anesthesiologist      Procedure Detail  ARTHROPLASTY, SHOULDER, TOTAL, REVERSE   Estimated Blood Loss:  200 mL         Drains: none  Blood Given: none          Specimens: none        Complications:  * No complications entered in OR log *         Disposition: PACU - hemodynamically stable.         Condition: stable      OPERATIVE FINDINGS:  A DJO Altivate pressfit reverse total shoulder arthroplasty was placed with a  size 10 stem, a 36 standard glenosphere, and a +8-mm poly insert. The base plate  fixation was excellent.  PROCEDURE: The patient was identified in the preoperative holding area  where I personally marked the operative site after verifying site, side,  and procedure with the patient. An interscalene block given by  the attending anesthesiologist in the holding area and the patient was taken back to the operating room where all extremities were  carefully padded in position after general anesthesia was induced. She  was placed in a beach-chair position and the operative upper extremity was  prepped and draped in a standard  sterile fashion. An approximately 10-  cm incision was made from the tip of the coracoid process to the center  point of the humerus at the level of the axilla. Dissection was carried  down through subcutaneous tissues to the level of the cephalic vein  which was taken laterally with the deltoid. The pectoralis major was  retracted medially. The subdeltoid space was developed and the lateral  edge of the conjoined tendon was identified. The undersurface of  conjoined tendon was palpated and the musculocutaneous nerve was not in  the field. Retractor was placed underneath the conjoined and second  retractor was placed lateral into the deltoid. The circumflex humeral  artery and vessels were identified and clamped and coagulated. The  biceps tendon was absent.  The subscapularis was degenerated and taken down as a peel.  The  joint was then gently externally rotated while the capsule was released  from the humeral neck around to just beyond the 6 o'clock position. At  this point, the joint was dislocated and the humeral head was presented  into the wound. The excessive osteophyte formation was removed with a  large rongeur.  The cutting guide was used to make the appropriate  head cut and the head was saved for potentially bone grafting.  The glenoid was exposed with the arm in an  abducted extended position. The anterior and posterior labrum were  completely excised and the capsule was released circumferentially to  allow for exposure of the glenoid for preparation. The 2.5 mm drill was  placed using the guide in 5-10 inferior angulation and the tap was then advanced in the same hole. Small and large reamers were then used. The tap was then removed and the Metaglene was then screwed in with excellent purchase.  The peripheral guide was then used to drilled measured and filled peripheral locking screws. The size 36 standard glenosphere was then impacted on the [facility] taper and the central screw  was placed. The humerus was then again exposed and the diaphyseal reamers were used followed by the metaphyseal reamers. The final broach was left in place in the proximal trial was placed. The joint was reduced and with this implant it was felt that soft tissue tensioning was appropriate with excellent stability and excellent range of motion. Therefore, final humeral stem was placed press-fit.  And then the trial polyethylene inserts were tested again and the above implant was felt to be the most appropriate for final insertion.  He was initially noted to be quite loose.  We ended up going up to a +8 construct which felt stable.  The joint was reduced taken through full range of motion and felt to be stable. Soft tissue tension was appropriate.  The joint was then copiously irrigated with pulse  lavage and the wound was then closed. The subscapularis was not repaired.  Skin was closed with 2-0 Vicryl in a deep dermal layer and 4-0  Monocryl for skin closure. Steri-Strips were applied. Sterile  dressings were then applied as well as a sling. The patient was allowed  to awaken from general anesthesia, transferred to stretcher, and taken  to recovery room in stable condition.   POSTOPERATIVE PLAN: The patient will be observed in the recovery room and if his pain is well-controlled with regional anesthesia and he is hemodynamically stable he can be discharged home today with family.

## 2023-09-20 NOTE — Addendum Note (Signed)
 Addendum  created 09/20/23 1417 by Gorman Laughter, MD   Clinical Note Signed

## 2023-09-20 NOTE — Evaluation (Signed)
 Occupational Therapy Evaluation Patient Details Name: [name] MRN: [id number] DOB: 02-17-1956 Today's Date: 09/20/2023   History of Present Illness   Mr. Wenger is a 68 yr old male who is s/p a R reverse total shoulder arthroplasty 09-20-23, due to rotator cuff tear arthropathy.     Clinical Impressions Pt is s/p shoulder replacement of right dominant upper extremity on 09-20-2023. Therapist provided education and instruction to patient and his spouse with regards to ROM/exercises, post-op precautions, UE and sling positioning, donning upper extremity clothing, R UE NWB status, no shoulder ROM, sling wear schedule, bathing while maintaining shoulder precautions, use of ice for pain and edema management, use of ice machine, and correctly donning/doffing sling. Patient and spouse verbalized and demonstrated understanding as needed. Patient needed assistance to donn shirt, underwear, shorts, socks and shoes, with instruction on compensatory strategies to perform ADLs. Patient to follow up with MD for further therapy needs.       If plan is discharge home, recommend the following:   A little help with bathing/dressing/bathroom;Assistance with cooking/housework;Assist for transportation     Functional Status Assessment   Patient has had a recent decline in their functional status and demonstrates the ability to make significant improvements in function in a reasonable and predictable amount of time.     Equipment Recommendations   None recommended by OT     Recommendations for Other Services         Precautions/Restrictions   Precautions Precautions: Shoulder Type of Shoulder Precautions: no shoulder ROM, okay to perform elbow, wrist, and hand ROM, sling to be worn at all times except ADLs/exercise Shoulder Interventions: Shoulder sling/immobilizer Precaution Booklet Issued: Yes (comment) Required Braces or Orthoses: Sling Restrictions Weight Bearing Restrictions Per  Provider Order: Yes RUE Weight Bearing Per Provider Order: Non weight bearing     Mobility Bed Mobility               General bed mobility comments: pt was received seated in a chair    Transfers Overall transfer level: Needs assistance Equipment used: None Transfers: Sit to/from Stand Sit to Stand: Contact guard assist                  Balance Overall balance assessment: Mild deficits observed, not formally tested              ADL either performed or assessed with clinical judgement       Pertinent Vitals/Pain Pain Assessment Pain Assessment: No/denies pain     Extremity/Trunk Assessment Upper Extremity Assessment Upper Extremity Assessment: Right hand dominant           Communication Communication Communication: No apparent difficulties   Cognition Arousal: Alert Behavior During Therapy: WFL for tasks assessed/performed Cognition: No apparent impairments      Following commands: Intact             Shoulder Instructions Shoulder Instructions Donning/doffing shirt without moving shoulder: Moderate assistance (with pt's spouse performing and OT providing instruction as needed) Method for sponge bathing under operated UE:  (Pt and spouse verbalized understanding) Donning/doffing sling/immobilizer: Minimal assistance (with pt's spouse performing and OT providing instruction as needed) Correct positioning of sling/immobilizer: Minimal assistance (with pt's spouse performing and OT providing instruction as needed) Pendulum exercises (written home exercise program):  (N/A) ROM for elbow, wrist and digits of operated UE:  (Pt and spouse verbalized understanding) Sling wearing schedule (on at all times/off for ADL's):  (Pt and spouse verbalized understanding) Proper positioning of operated  UE when showering:  (Pt and spouse verbalized understanding) Dressing change:  (Pt and spouse verbalized understanding) Positioning of UE while sleeping:  (Pt  and spouse verbalized understanding)    Home Living Family/patient expects to be discharged to:: Private residence Living Arrangements: Spouse/significant other Available Help at Discharge: Family Type of Home: House Home Access: Ramped entrance     Home Layout: One level     Bathroom Shower/Tub: Tub/shower unit         Home Equipment: Cane - single Information systems manager (2 wheels);Tub bench          Prior Functioning/Environment Prior Level of Function : Independent/Modified Independent;Driving             Mobility Comments: He was independent with ambulation. ADLs Comments: He was modified independent to independent with ADLs, and he shared cooking and cleaning with his spouse.    OT Problem List: Impaired UE functional use;Decreased range of motion        OT Goals(Current goals can be found in the care plan section)   Acute Rehab OT Goals OT Goal Formulation: All assessment and education complete, DC therapy   OT Frequency:   N/A       AM-PAC OT "6 Clicks" Daily Activity     Outcome Measure Help from another person eating meals?: None Help from another person taking care of personal grooming?: None Help from another person toileting, which includes using toliet, bedpan, or urinal?: A Little Help from another person bathing (including washing, rinsing, drying)?: A Little Help from another person to put on and taking off regular upper body clothing?: A Lot Help from another person to put on and taking off regular lower body clothing?: A Little 6 Click Score: 19   End of Session Equipment Utilized During Treatment: Other (comment) (N/A) Nurse Communication: Other (comment) (shoulder education completed)  Activity Tolerance: Patient tolerated treatment well Patient left: in chair;with call bell/phone within reach;with family/visitor present  OT Visit Diagnosis: Muscle weakness (generalized) (M62.81)                Time: 7829-5621 OT Time  Calculation (min): 29 min Charges:  OT General Charges $OT Visit: 1 Visit OT Evaluation $OT Eval Moderate Complexity: 1 Mod OT Treatments $Self Care/Home Management : 8-22 mins    Sheralyn Dies, OTR/L 09/20/2023, 4:57 PM

## 2023-09-20 NOTE — Anesthesia Postprocedure Evaluation (Addendum)
 Anesthesia Post Note  Patient: [name]  Procedure(s) Performed: ARTHROPLASTY, SHOULDER, TOTAL, REVERSE (Right: Shoulder)     Patient location during evaluation: PACU Anesthesia Type: Regional Level of consciousness: awake and alert and patient cooperative Pain management: pain level controlled Vital Signs Assessment: post-procedure vital signs reviewed and stable Respiratory status: spontaneous breathing Cardiovascular status: stable Anesthetic complications: no Comments: Called by PACU RN that pt SpO2 dips down into 80's periodically. He has been using IS and stays in low 90's. I went to the bedside. Highest I saw was 89% with him aggressively using IS. The lowest it got was 83% while he took a brief break from IS.  He doesn't feel SOB. Will obtain Xray. I explained the diaphragm can be affected by the block. He apparently had the same issue last time, but was ultimately able to be D/C'd home.  Xray indeed showed elevated R hemidiaphragm. I talked with the patient regarding the findings and that if he didn't improve he would likely have to spend the night. While we were talking his SpO2, without IS use, was anywhere from 90% to 94%. He was consistently 93%. He again states he feels fine, no SOB or dizziness. He uses C-PAP at home and will use it when resting. Based on his presentation now I approved him to move to phase II and plan to D/C home.   No notable events documented.  Last Vitals:  Vitals:   09/20/23 1245 09/20/23 1250  BP: 96/67 99/70  Pulse: 89 89  Resp: 14 18  Temp: 36.5 C   SpO2: 90% 92%    Last Pain:  Vitals:   09/20/23 1245  TempSrc:   PainSc: 3                  Gorman Laughter

## 2023-09-20 NOTE — Anesthesia Procedure Notes (Signed)
 Anesthesia Regional Block: Interscalene brachial plexus block   Pre-Anesthetic Checklist: , timeout performed,  Correct Patient, Correct Site, Correct Laterality,  Correct Procedure, Correct Position, site marked,  Risks and benefits discussed,  Surgical consent,  Pre-op evaluation,  At surgeon's request and post-op pain management  Laterality: Upper and Right  Prep: chloraprep       Needles:  Injection technique: Single-shot  Needle Type: Stimulator Needle - 40     Needle Length: 4cm  Needle Gauge: 22     Additional Needles:   Procedures:,,,, ultrasound used (permanent image in chart),,    Narrative:  Start time: 09/20/2023 8:40 AM End time: 09/20/2023 9:00 AM Injection made incrementally with aspirations every 5 mL.  Performed by: Personally  Anesthesiologist: Gorman Laughter, MD  Additional Notes: BP cuff, SpO2 and EKG monitors applied. Sedation begun. Nerve location verified with ultrasound. Anesthetic injected incrementally, slowly, and after neg aspirations under direct u/s guidance. Good perineural spread. Tolerated well.

## 2023-09-20 NOTE — Discharge Instructions (Addendum)
Discharge Instructions after Reverse Total Shoulder Arthroplasty   A sling has been provided for you. You are to wear this at all times (except for bathing and dressing), until your first post operative visit with Dr. Ave Filter. Please also wear while sleeping at night. While you bath and dress, let the arm/elbow extend straight down to stretch your elbow. Wiggle your fingers and pump your first while your in the sling to prevent hand swelling. Use ice on the shoulder intermittently over the first 48 hours after surgery. Continue to use ice or and ice machine as needed after 48 hours for pain control/swelling.  Pain medicine has been prescribed for you.  Use your medicine liberally over the first 48 hours, and then you can begin to taper your use. You may take Extra Strength Tylenol or Tylenol only in place of the pain pills. DO NOT take ANY nonsteroidal anti-inflammatory pain medications: Advil, Motrin, Ibuprofen, Aleve, Naproxen or Naprosyn.  Resume aspirin the day after surgery  Leave your dressing on until your first follow up visit.  You may shower with the dressing.  Hold your arm as if you still have your sling on while you shower. Simply allow the water to wash over the site and then pat dry. Make sure your axilla (armpit) is completely dry after showering.    Please call [phone] during normal business hours or [phone] after hours for any problems. Including the following:  - excessive redness of the incisions - drainage for more than 4 days - fever of more than 101.5 F  *Please note that pain medications will not be refilled after hours or on weekends.    Dental Antibiotics:  In most cases prophylactic antibiotics for Dental procdeures after total joint surgery are not necessary.  Exceptions are as follows:  1. History of prior total joint infection  2. Severely immunocompromised (Organ Transplant, cancer chemotherapy, Rheumatoid biologic meds such as Humera)  3.  Poorly controlled diabetes (A1C &gt; 8.0, blood glucose over 200)  If you have one of these conditions, contact your surgeon for an antibiotic prescription, prior to your dental procedure.

## 2023-09-20 NOTE — Anesthesia Procedure Notes (Signed)
 Procedure Name: Intubation Date/Time: 09/20/2023 9:54 AM  Performed by: Melodee Spruce, CRNAPre-anesthesia Checklist: Patient identified, Emergency Drugs available, Suction available and Patient being monitored Patient Re-evaluated:Patient Re-evaluated prior to induction Oxygen Delivery Method: Circle system utilized Preoxygenation: Pre-oxygenation with 100% oxygen Induction Type: IV induction Ventilation: Mask ventilation without difficulty Laryngoscope Size: Glidescope and 4 Grade View: Grade I Tube type: Oral Tube size: 7.5 mm Number of attempts: 1 Airway Equipment and Method: Rigid stylet and Video-laryngoscopy Placement Confirmation: positive ETCO2, breath sounds checked- equal and bilateral and ETT inserted through vocal cords under direct vision Secured at: 23 cm Tube secured with: Tape Dental Injury: Teeth and Oropharynx as per pre-operative assessment

## 2023-09-20 NOTE — Transfer of Care (Signed)
 Immediate Anesthesia Transfer of Care Note  Patient: [name]  Procedure(s) Performed: ARTHROPLASTY, SHOULDER, TOTAL, REVERSE (Right: Shoulder)  Patient Location: PACU  Anesthesia Type:GA combined with regional for post-op pain  Level of Consciousness: drowsy and patient cooperative  Airway & Oxygen Therapy: Patient Spontanous Breathing and Patient connected to face mask oxygen  Post-op Assessment: Report given to RN and Post -op Vital signs reviewed and stable  Post vital signs: Reviewed and stable  Last Vitals:  Vitals Value Taken Time  BP 148/93 09/20/23 1112  Temp    Pulse 89 09/20/23 1114  Resp 13 09/20/23 1114  SpO2 99 % 09/20/23 1114  Vitals shown include unfiled device data.  Last Pain:  Vitals:   09/20/23 0905  TempSrc:   PainSc: 0-No pain         Complications: No notable events documented.

## 2023-09-21 ENCOUNTER — Encounter (HOSPITAL_COMMUNITY): Payer: Self-pay | Admitting: Orthopedic Surgery
